# Patient Record
Sex: Male | Born: 1940 | Race: White | Hispanic: No | Marital: Married | Smoking: Current every day smoker
Health system: Southern US, Community
[De-identification: ages and names within clinical notes are randomized; demographics above are authoritative.]

## PROBLEM LIST (undated history)

## (undated) DIAGNOSIS — I441 Atrioventricular block, second degree: Secondary | ICD-10-CM

## (undated) DIAGNOSIS — H9190 Unspecified hearing loss, unspecified ear: Secondary | ICD-10-CM

## (undated) DIAGNOSIS — I495 Sick sinus syndrome: Secondary | ICD-10-CM

## (undated) DIAGNOSIS — I10 Essential (primary) hypertension: Secondary | ICD-10-CM

## (undated) DIAGNOSIS — E785 Hyperlipidemia, unspecified: Secondary | ICD-10-CM

## (undated) DIAGNOSIS — Z923 Personal history of irradiation: Secondary | ICD-10-CM

## (undated) DIAGNOSIS — Z720 Tobacco use: Secondary | ICD-10-CM

## (undated) DIAGNOSIS — F419 Anxiety disorder, unspecified: Secondary | ICD-10-CM

## (undated) DIAGNOSIS — Z950 Presence of cardiac pacemaker: Secondary | ICD-10-CM

## (undated) HISTORY — DX: Unspecified hearing loss, unspecified ear: H91.90

## (undated) HISTORY — DX: Sick sinus syndrome: I49.5

## (undated) HISTORY — DX: Presence of cardiac pacemaker: Z95.0

## (undated) HISTORY — PX: BACK SURGERY: SHX140

---

## 1994-12-06 HISTORY — PX: HERNIA REPAIR: SHX51

## 2010-12-06 DIAGNOSIS — Z950 Presence of cardiac pacemaker: Secondary | ICD-10-CM

## 2010-12-06 DIAGNOSIS — I498 Other specified cardiac arrhythmias: Secondary | ICD-10-CM

## 2010-12-06 DIAGNOSIS — I495 Sick sinus syndrome: Secondary | ICD-10-CM

## 2010-12-06 HISTORY — DX: Presence of cardiac pacemaker: Z95.0

## 2010-12-06 HISTORY — DX: Sick sinus syndrome: I49.5

## 2010-12-06 HISTORY — DX: Other specified cardiac arrhythmias: I49.8

## 2011-11-11 ENCOUNTER — Inpatient Hospital Stay (HOSPITAL_COMMUNITY)
Admission: EM | Admit: 2011-11-11 | Discharge: 2011-11-13 | DRG: 244 | Disposition: A | Discharge status: Home / Self Care | Payer: PRIVATE HEALTH INSURANCE | Attending: Cardiology | Admitting: Cardiology

## 2011-11-11 ENCOUNTER — Emergency Department (HOSPITAL_COMMUNITY): Payer: PRIVATE HEALTH INSURANCE

## 2011-11-11 DIAGNOSIS — I10 Essential (primary) hypertension: Secondary | ICD-10-CM | POA: Insufficient documentation

## 2011-11-11 DIAGNOSIS — I059 Rheumatic mitral valve disease, unspecified: Secondary | ICD-10-CM

## 2011-11-11 DIAGNOSIS — R001 Bradycardia, unspecified: Secondary | ICD-10-CM

## 2011-11-11 DIAGNOSIS — Z7982 Long term (current) use of aspirin: Secondary | ICD-10-CM

## 2011-11-11 DIAGNOSIS — E119 Type 2 diabetes mellitus without complications: Secondary | ICD-10-CM | POA: Diagnosis present

## 2011-11-11 DIAGNOSIS — I442 Atrioventricular block, complete: Secondary | ICD-10-CM | POA: Diagnosis present

## 2011-11-11 DIAGNOSIS — I452 Bifascicular block: Principal | ICD-10-CM | POA: Diagnosis present

## 2011-11-11 DIAGNOSIS — I498 Other specified cardiac arrhythmias: Secondary | ICD-10-CM

## 2011-11-11 DIAGNOSIS — Z794 Long term (current) use of insulin: Secondary | ICD-10-CM

## 2011-11-11 DIAGNOSIS — F17200 Nicotine dependence, unspecified, uncomplicated: Secondary | ICD-10-CM | POA: Diagnosis present

## 2011-11-11 DIAGNOSIS — Z720 Tobacco use: Secondary | ICD-10-CM | POA: Insufficient documentation

## 2011-11-11 DIAGNOSIS — I441 Atrioventricular block, second degree: Secondary | ICD-10-CM

## 2011-11-11 HISTORY — DX: Atrioventricular block, second degree: I44.1

## 2011-11-11 HISTORY — DX: Tobacco use: Z72.0

## 2011-11-11 HISTORY — DX: Essential (primary) hypertension: I10

## 2011-11-11 LAB — MAGNESIUM: Magnesium: 2.1 mg/dL (ref 1.5–2.5)

## 2011-11-11 LAB — DIFFERENTIAL
Basophils Absolute: 0 10*3/uL (ref 0.0–0.1)
Basophils Relative: 0 % (ref 0–1)
Lymphocytes Relative: 12 % (ref 12–46)
Neutro Abs: 3.7 10*3/uL (ref 1.7–7.7)
Neutrophils Relative %: 64 % (ref 43–77)

## 2011-11-11 LAB — COMPREHENSIVE METABOLIC PANEL
ALT: 21 U/L (ref 0–53)
AST: 20 U/L (ref 0–37)
Albumin: 3.8 g/dL (ref 3.5–5.2)
Alkaline Phosphatase: 104 U/L (ref 39–117)
CO2: 26 mEq/L (ref 19–32)
Chloride: 102 mEq/L (ref 96–112)
Potassium: 4.2 mEq/L (ref 3.5–5.1)
Total Bilirubin: 0.4 mg/dL (ref 0.3–1.2)

## 2011-11-11 LAB — CARDIAC PANEL(CRET KIN+CKTOT+MB+TROPI)
CK, MB: 2.2 ng/mL (ref 0.3–4.0)
CK, MB: 2.1 ng/mL (ref 0.3–4.0)
Relative Index: INVALID (ref 0.0–2.5)
Total CK: 64 U/L (ref 7–232)
Troponin I: <0.3 ng/mL (ref <0.30)
Troponin I: <0.3 ng/mL (ref <0.30)

## 2011-11-11 LAB — POCT I-STAT TROPONIN I: Troponin i, poc: 0 ng/mL (ref 0.00–0.08)

## 2011-11-11 LAB — URINALYSIS, ROUTINE W REFLEX MICROSCOPIC
Bilirubin Urine: NEGATIVE
Glucose, UA: NEGATIVE mg/dL
Hgb urine dipstick: NEGATIVE
Ketones, ur: NEGATIVE mg/dL
Protein, ur: NEGATIVE mg/dL

## 2011-11-11 LAB — CBC
Hemoglobin: 14.2 g/dL (ref 13.0–17.0)
MCHC: 35 g/dL (ref 30.0–36.0)
RDW: 13.7 % (ref 11.5–15.5)
WBC: 5.8 10*3/uL (ref 4.0–10.5)

## 2011-11-11 LAB — PROTIME-INR
INR: 1.08 (ref 0.00–1.49)
Prothrombin Time: 14.2 seconds (ref 11.6–15.2)

## 2011-11-11 LAB — GLUCOSE, CAPILLARY: Glucose-Capillary: 99 mg/dL (ref 70–99)

## 2011-11-11 MED ORDER — CHLORHEXIDINE GLUCONATE 4 % EX LIQD
60.0000 mL | Freq: Once | CUTANEOUS | Status: AC
  Administered 2011-11-11: 4 via TOPICAL
  Filled 2011-11-11: qty 15

## 2011-11-11 MED ORDER — SODIUM CHLORIDE 0.45 % IV SOLN: INTRAVENOUS | Status: DC

## 2011-11-11 MED ORDER — SODIUM CHLORIDE 0.9 % IR SOLN
80.0000 mg | Status: DC
  Filled 2011-11-11: qty 2

## 2011-11-11 MED ORDER — SODIUM CHLORIDE 0.9 % IV SOLN
INTRAVENOUS | Status: DC
  Administered 2011-11-11: 11:00:00 via INTRAVENOUS

## 2011-11-11 MED ORDER — NITROGLYCERIN 0.4 MG SL SUBL: 0.4000 mg | SUBLINGUAL_TABLET | SUBLINGUAL | Status: DC | PRN

## 2011-11-11 MED ORDER — ATROPINE SULFATE 1 MG/ML IJ SOLN
0.5000 mg | Freq: Once | INTRAMUSCULAR | Status: AC
  Administered 2011-11-11: 0.5 mg via INTRAVENOUS
  Filled 2011-11-11: qty 1

## 2011-11-11 MED ORDER — SODIUM CHLORIDE 0.9 % IJ SOLN
3.0000 mL | Freq: Two times a day (BID) | INTRAMUSCULAR | Status: DC
  Administered 2011-11-11 – 2011-11-13 (×4): 3 mL via INTRAVENOUS

## 2011-11-11 MED ORDER — ASPIRIN EC 81 MG PO TBEC
81.0000 mg | DELAYED_RELEASE_TABLET | Freq: Every day | ORAL | Status: DC
  Administered 2011-11-12 – 2011-11-13 (×2): 81 mg via ORAL
  Filled 2011-11-11 (×2): qty 1

## 2011-11-11 MED ORDER — ASPIRIN 81 MG PO CHEW: 324.0000 mg | CHEWABLE_TABLET | ORAL | Status: DC

## 2011-11-11 MED ORDER — ACETAMINOPHEN 325 MG PO TABS
650.0000 mg | ORAL_TABLET | ORAL | Status: DC | PRN
  Administered 2011-11-12: 650 mg via ORAL
  Filled 2011-11-11: qty 2

## 2011-11-11 MED ORDER — CEFAZOLIN SODIUM 1-5 GM-% IV SOLN
1.0000 g | INTRAVENOUS | Status: DC
  Filled 2011-11-11: qty 50

## 2011-11-11 MED ORDER — SODIUM CHLORIDE 0.9 % IJ SOLN: 3.0000 mL | INTRAMUSCULAR | Status: DC | PRN

## 2011-11-11 MED ORDER — SODIUM CHLORIDE 0.9 % IV SOLN: 250.0000 mL | INTRAVENOUS | Status: DC | PRN

## 2011-11-11 MED ORDER — ONDANSETRON HCL 4 MG/2ML IJ SOLN
4.0000 mg | Freq: Once | INTRAMUSCULAR | Status: AC
  Administered 2011-11-11: 4 mg via INTRAVENOUS

## 2011-11-11 MED ORDER — ONDANSETRON HCL 4 MG/2ML IJ SOLN
4.0000 mg | Freq: Once | INTRAMUSCULAR | Status: DC
  Filled 2011-11-11: qty 2

## 2011-11-11 MED ORDER — ASPIRIN 300 MG RE SUPP
300.0000 mg | RECTAL | Status: DC
  Filled 2011-11-11: qty 1

## 2011-11-11 MED ORDER — THERA M PLUS PO TABS: 1.0000 | ORAL_TABLET | Freq: Every day | ORAL | Status: DC

## 2011-11-11 MED ORDER — SODIUM CHLORIDE 0.9 % IV SOLN
INTRAVENOUS | Status: DC
  Administered 2011-11-12: 50 mL/h via INTRAVENOUS

## 2011-11-11 MED ORDER — INSULIN ASPART 100 UNIT/ML ~~LOC~~ SOLN
0.0000 [IU] | Freq: Three times a day (TID) | SUBCUTANEOUS | Status: DC
  Filled 2011-11-11: qty 3

## 2011-11-11 MED ORDER — ASPIRIN EC 81 MG PO TBEC: 81.0000 mg | DELAYED_RELEASE_TABLET | Freq: Every day | ORAL | Status: DC

## 2011-11-11 MED ORDER — MORPHINE SULFATE 4 MG/ML IJ SOLN
4.0000 mg | Freq: Once | INTRAMUSCULAR | Status: DC
  Filled 2011-11-11: qty 1

## 2011-11-11 MED ORDER — ONDANSETRON HCL 4 MG/2ML IJ SOLN: 4.0000 mg | Freq: Four times a day (QID) | INTRAMUSCULAR | Status: DC | PRN

## 2011-11-11 MED ORDER — CHLORHEXIDINE GLUCONATE 4 % EX LIQD
60.0000 mL | Freq: Once | CUTANEOUS | Status: AC
  Administered 2011-11-12: 4 via TOPICAL
  Filled 2011-11-11: qty 15

## 2011-11-11 MED ORDER — MORPHINE SULFATE 4 MG/ML IJ SOLN
4.0000 mg | Freq: Once | INTRAMUSCULAR | Status: AC
  Administered 2011-11-11: 4 mg via INTRAVENOUS

## 2011-11-11 MED ORDER — ATROPINE SULFATE 1 MG/ML IJ SOLN: 0.5000 mg | Freq: Once | INTRAMUSCULAR | Status: DC

## 2011-11-11 MED ORDER — THERA M PLUS PO TABS
1.0000 | ORAL_TABLET | Freq: Every day | ORAL | Status: DC
  Administered 2011-11-12 – 2011-11-13 (×2): 1 via ORAL
  Filled 2011-11-11 (×3): qty 1

## 2011-11-11 NOTE — ED Notes (Signed)
Pt seen at pcp for regular check up.  Pt had ekg changes (sinus brady).  Sent to ed for evaluation

## 2011-11-11 NOTE — Progress Notes (Signed)
  Echocardiogram 2D Echocardiogram has been performed.  Antonio Hancock 11/11/2011, 5:15 PM

## 2011-11-11 NOTE — ED Notes (Signed)
Pt sitting up in bed talking with family at bedside. Pt smiling/laughing. Denies cp. Denies sob. Pt has no complaints at this time.

## 2011-11-11 NOTE — ED Provider Notes (Signed)
History     CSN: [id number] Arrival date & time: 11/11/2011 10:18 AM   First MD Initiated Contact with Patient 11/11/11 1051      Chief Complaint  Patient presents with  . Bradycardia    (Consider location/radiation/quality/duration/timing/severity/associated sxs/prior treatment) HPI Comments: Patient is a 71 year old man who went in for a routine exam with his primary care physician. Have a heart rate that was very slow. An EKG showed a bradycardia of about 37. He was therefore referred to [facility] [place] for evaluation. He did not feel sick in any way prior to this discovery. He was being checked for diabetes and hypertension.  Patient is a 70 y.o. male presenting with palpitations.  Palpitations  This is a new problem. The current episode started less than 1 hour ago. The problem occurs constantly. The problem has not changed since onset.Episode Length: It is not known how long he has had a bradycardia. He has tried nothing for the symptoms. Risk factors include smoking/tobacco exposure, diabetes mellitus and being male.    Past Medical History  Diagnosis Date  . Hypertension   . Diabetes mellitus     Diagnosed Fall 2012  . Mobitz type 2 second degree AV block     11/11/2011  . Tobacco abuse     1/2 ppd x 55 yrs    Past Surgical History  Procedure Date  . Back surgery     History reviewed. No pertinent family history.  History  Substance Use Topics  . Smoking status: Current Everyday Smoker -- 0.5 packs/day    Types: Cigarettes  . Smokeless tobacco: Not on file  . Alcohol Use: No      Review of Systems  Constitutional: Negative.   HENT: Negative.   Eyes: Negative.   Respiratory: Negative.   Cardiovascular: Positive for palpitations.       Bradycardia.  Genitourinary: Negative.   Musculoskeletal: Negative.   Skin: Negative.     Allergies  Review of patient's allergies indicates no known allergies.  Home Medications   Current Outpatient Rx  Name Route  Sig Dispense Refill  . ASPIRIN EC 81 MG PO TBEC Oral Take 81 mg by mouth daily.      Marland Kitchen METFORMIN HCL ER 500 MG PO TB24 Oral Take 500 mg by mouth daily with supper.      Carma Leaven M PLUS PO TABS Oral Take 1 tablet by mouth daily.      Marland Kitchen OLMESARTAN MEDOXOMIL 20 MG PO TABS Oral Take 20 mg by mouth daily.        BP 169/56  Pulse 37  Temp(Src) 98.6 F (37 C) (Oral)  Resp 16  SpO2 99%  Physical Exam  ED Course  CRITICAL CARE Performed by: Osvaldo Human Authorized by: Osvaldo Human Total critical care time: 60 minutes Critical care was necessary to treat or prevent imminent or life-threatening deterioration of the following conditions: Symptomatic bradycardia. Critical care was time spent personally by me on the following activities: development of treatment plan with patient or surrogate, discussions with consultants, evaluation of patient's response to treatment, examination of patient, obtaining history from patient or surrogate, ordering and performing treatments and interventions, ordering and review of radiographic studies, ordering and review of laboratory studies, re-evaluation of patient's condition and transcutaneous pacing.   (including critical care time)  12:48 PM  Date: 11/11/2011  Rate: 38  Rhythm: sinus bradycardia  QRS Axis: left QRS:  Left ventricular hypertrophy  Intervals: normal  ST/T Wave abnormalities:  normal  Conduction Disutrbances:right bundle branch block and left anterior fascicular block  Narrative Interpretation: Abnormal EKG.    Old EKG Reviewed: none available   12:48 PM Pt seen --> physical exam performed.  IV Atropine ordered --> minimal response.  Paddles for transcutaneous pacemaker applied.  [facility] had been called by [facility]; they are here to see pt.     12:48 PM Patient now feels lightheaded, and has a funny taste in his mouth. I advised him that we would give him additional atropine, as well as some  morphine and Zofran, and we'll start his pacemaker at a rate of 70 and 70 mV.  12:48 PM  Date: 11/11/2011  Rate: 37  Rhythm: sinus bradycardia with 2:1 AV Block  QRS Axis: left  Intervals: normal  ST/T Wave abnormalities: normal  Conduction Disutrbances:  2:1 AV block  Narrative Interpretation: Abnormal EKG  Old EKG Reviewed: unchanged  12:48 PM Admitted by [facility] to CCU, 2900.  1. Bradycardia            Carleene Cooper III, MD 11/11/11 1248

## 2011-11-11 NOTE — ED Notes (Signed)
Cardiology at bedside.

## 2011-11-11 NOTE — H&P (Signed)
Patient ID: Antonio Hancock MRN: [id number], DOB/AGE: [birth year]   Admit date: 11/11/2011   Primary Physician: Angelene Giovanni, PA-C; Benedetto Goad, MD.  Primary Cardiologist: New -  D. McLean  Pt. Profile: 70 year old male referred from PCP to ER for newly discovered/asymptomatic bradycardia.  Problem List: Past Medical History  Diagnosis Date  . Hypertension   . Diabetes mellitus     Diagnosed Fall 2012  . Mobitz type 2 second degree AV block     11/11/2011  . Tobacco abuse     1/2 ppd x 55 yrs    Past Surgical History  Procedure Date  . Back surgery      Allergies: No Known Allergies  HPI:   70 year old male w/o prior cardiac history.  Over the past 3-4 months, pt has noted intermittent dizziness, usually while moving from a lying or seated position to a standing position.  He is pretty active, walking 1 mile daily without limitations.  He checks his BP regularly and has noted in the past week that his HR dipped into 30's at times.  Asymptomatic. Pt went to [facility] today for his scheduled 90-day follow-up appointment for diabetes and HTN management.  He was noted to be bradycardic in the 30's, an ECG was obtained that showed 2:1 AV-block and EMS was called for transport to the ED.  He remains asymptomatic and denies prior h/o c/p, sob, weakness, fatigue, diaphoresis, leg pain, or edema.  In the ED he was given 0.5 mg of IV atropine with minimal ventricular response.  Pt currently has Zoll pads on and is asymptomatic.  His SBP is running in the 180's.   ER Medications Medications Prior to Admission  Medication Dose Route Frequency Provider Last Rate Last Dose  . 0.9 %  sodium chloride infusion   Intravenous Continuous Carleene Cooper III, MD 80 mL/hr at 11/11/11 1120    . atropine injection 0.5 mg  0.5 mg Intravenous Once Carleene Cooper III, MD   0.5 mg at 11/11/11 1124  . atropine injection 0.5 mg  0.5 mg Intravenous Once Carleene Cooper III, MD   0.5 mg at  11/11/11 1234  . atropine injection 0.5 mg  0.5 mg Intravenous Once Carleene Cooper III, MD      . morphine 4 MG/ML injection 4 mg  4 mg Intravenous Once Carleene Cooper III, MD      . morphine 4 MG/ML injection 4 mg  4 mg Intravenous Once Carleene Cooper III, MD   4 mg at 11/11/11 1242  . ondansetron (ZOFRAN) injection 4 mg  4 mg Intravenous Once Carleene Cooper III, MD      . ondansetron [facility]) injection 4 mg  4 mg Intravenous Once Carleene Cooper III, MD   4 mg at 11/11/11 1242        Family History:  Mother- Brain tumor, deceased 54 Father-Prostate CA, DVT, HTN, DM, deceased 22 Brother-DM, HTN Sisters-2 dead   History   Social History  . Marital Status: yes    Spouse Name: N/A    Number of Children: none  . Years of Education: N/A   Occupational History  . Self employed.    Social History Main Topics  . Smoking status: Current Everyday Smoker -- 0.5-0.75 packs/dayx55 years    Types: Cigarettes  . Smokeless tobacco: Not on file  . Alcohol Use: No  . Drug Use: No  . Sexually Active:    Other Topics Concern  . Not on file  Social History Narrative  . No narrative on file     Review of Systems: General: negative for chills, fever, night sweats or weight changes.  Cardiovascular: negative for chest pain, dyspnea on exertion, edema, orthopnea, palpitations, paroxysmal nocturnal dyspnea.  He has noted occas dizziness - orthostasis Dermatological: negative for rash. Respiratory: negative for cough or wheezing. Urologic: negative for hematuria, urgency, dysuria. Abdominal: positive for nausea (s/p morphine), negative for vomiting, diarrhea, bright red blood per rectum, melena, or hematemesis Neurologic: Positive for dizziness, presyncope,. negative for visual changes, syncope. All other systems reviewed and are otherwise negative except as noted above.  Physical Exam: Blood pressure 169/56, pulse 37, temperature 98.6 F (37 C), temperature source Oral, resp. rate 16, SpO2  99.00%.  General: Well developed, well nourished, in no acute distress. Head: Normocephalic, atraumatic, sclera non-icteric, no xanthomas, nares are without discharge.  Poor dentition.   Neck: Supple without bruits or JVD. Lungs:  Resp regular and unlabored, markedly diminished breath sounds.  rhonchi x4 posterior fields. Heart: regular, bradycardic - distant heart sounds.. no s3, s4, or murmurs. Abdomen: Soft, non-tender, non-distended, BS + x 4.  Msk:  Strength and tone appears normal for age. Extremities: No clubbing, cyanosis or edema. DP/PT/Radials 2+ and equal bilaterally. Neuro: Alert and oriented X 3. Moves all extremities spontaneously. Psych: Pleasant, normal affect.   Labs:   Results for orders placed during the hospital encounter of 11/11/11 (from the past 72 hour(s))  CBC     Status: Normal   Collection Time   11/11/11 11:05 AM      Component Value Range Comment   WBC 5.8  4.0 - 10.5 (K/uL)    RBC 4.54  4.22 - 5.81 (MIL/uL)    Hemoglobin 14.2  13.0 - 17.0 (g/dL)    HCT 40.9  81.1 - 91.4 (%)    MCV 89.4  78.0 - 100.0 (fL)    MCH 31.3  26.0 - 34.0 (pg)    MCHC 35.0  30.0 - 36.0 (g/dL)    RDW 78.2  95.6 - 21.3 (%)    Platelets 211  150 - 400 (K/uL)   DIFFERENTIAL     Status: Abnormal   Collection Time   11/11/11 11:05 AM      Component Value Range Comment   Neutrophils Relative 64  43 - 77 (%)    Neutro Abs 3.7  1.7 - 7.7 (K/uL)    Lymphocytes Relative 12  12 - 46 (%)    Lymphs Abs 0.7  0.7 - 4.0 (K/uL)    Monocytes Relative 14 (*) 3 - 12 (%)    Monocytes Absolute 0.8  0.1 - 1.0 (K/uL)    Eosinophils Relative 10 (*) 0 - 5 (%)    Eosinophils Absolute 0.6  0.0 - 0.7 (K/uL)    Basophils Relative 0  0 - 1 (%)    Basophils Absolute 0.0  0.0 - 0.1 (K/uL)   COMPREHENSIVE METABOLIC PANEL     Status: Abnormal   Collection Time   11/11/11 11:05 AM      Component Value Range Comment   Sodium 137  135 - 145 (mEq/L)    Potassium 4.2  3.5 - 5.1 (mEq/L)    Chloride 102  96 -  112 (mEq/L)    CO2 26  19 - 32 (mEq/L)    Glucose, Bld 98  70 - 99 (mg/dL)    BUN 20  6 - 23 (mg/dL)    Creatinine, Ser 0.86  0.50 - 1.35 (  mg/dL)    Calcium 9.3  8.4 - 10.5 (mg/dL)    Total Protein 7.9  6.0 - 8.3 (g/dL)    Albumin 3.8  3.5 - 5.2 (g/dL)    AST 20  0 - 37 (U/L)    ALT 21  0 - 53 (U/L)    Alkaline Phosphatase 104  39 - 117 (U/L)    Total Bilirubin 0.4  0.3 - 1.2 (mg/dL)    GFR calc non Af Amer 55 (*) >90 (mL/min)    GFR calc Af Amer 64 (*) >90 (mL/min)   CARDIAC PANEL(CRET KIN+CKTOT+MB+TROPI)     Status: Normal   Collection Time   11/11/11 11:06 AM      Component Value Range Comment   Total CK 64  7 - 232 (U/L)    CK, MB 2.3  0.3 - 4.0 (ng/mL)    Troponin I <0.30  <0.30 (ng/mL)    Relative Index RELATIVE INDEX IS INVALID  0.0 - 2.5    POCT I-STAT TROPONIN I     Status: Normal   Collection Time   11/11/11 11:21 AM      Component Value Range Comment   Troponin i, poc 0.00  0.00 - 0.08 (ng/mL)    Comment 3               Radiology/Studies: Dg Chest Port 1 View  11/11/2011  *RADIOLOGY REPORT*  Clinical Data: Huston Foley cardia  PORTABLE CHEST - 1 VIEW  Comparison: None.  Findings: The lungs are clear.  There is moderate cardiomegaly present.  Harrington rods are noted for fusion of the lower thoracic spine.  IMPRESSION: Cardiomegaly.  No active lung disease.  Original Report Authenticated By: Juline Patch, M.D.    EKG: From family practice--sinus-brady w/ 2:1 AV-block.  LAD+RBBB, left ant. fasicular block. PR 184 ms; QRS 164 ms; QTc 463 ms.  ASSESSMENT AND PLAN:  1) Mobitz II heart block:  1 week h/o of noting HR's in the 30's - asymptomatic.  Found today to have Mobtiz II AVB.  Plan to admit to CCU with zoll @ bedside and pads on.  Lytes ok.  Check echo to eval wall motion and help guide as to whether or not pt will require further ischemic eval prior to PPM placement, which we will tentatively plan for tomorrow.  EP to see in am or sooner if necessary.   2) HTN- Currently  hypertensive, will watch BP while pt is bradycardic.  Hold benicar. 3) DM- present on admission. BG 98 mg/dL on chemistry.  Add ssi.  Hold metformin. 4) Tob abuse:  Cessation advised.  Signed, Nicolasa Ducking, NP 11/11/2011, 1:04 PM  Patient seen with NP, agree with note.  70 yo with history of HTN and DM was found to be bradycardic at PCP's office today (routine diabetes followup).  ECGs have shown HR 30s-40s with 2:1 AV block, RBBB + LAFB.  He is not on nodal blockers.  He has been essentially asymptomatic: occasional vague dizziness but no syncope, no chest pain, no exertional dyspnea.  One set of cardiac enzymes has been negative.  SBP has been elevated 160s-180s.  No response to atropine.   - Admit to step down unit. - Pads in place for temporary pacing if needed. - Echo today for LV function.  Will cycle cardiac enzymes.  If echo and enzymes unremarkable, can likely place PPM tomorrow with ischemic workup (myoview) afterwards or as outpatient given no symptoms.  If either are abnormal, may need  cath prior to PPM.  - ASA 81 mg daily.   Marca Ancona 11/11/2011 1:13 PM

## 2011-11-11 NOTE — ED Notes (Signed)
Pacer pads applied to pt

## 2011-11-12 ENCOUNTER — Encounter (HOSPITAL_COMMUNITY)
Admission: EM | Disposition: A | Discharge status: Home / Self Care | Payer: Self-pay | Source: Home / Self Care | Attending: Cardiology

## 2011-11-12 DIAGNOSIS — I498 Other specified cardiac arrhythmias: Secondary | ICD-10-CM

## 2011-11-12 DIAGNOSIS — I441 Atrioventricular block, second degree: Secondary | ICD-10-CM

## 2011-11-12 HISTORY — PX: PERMANENT PACEMAKER INSERTION: SHX5480

## 2011-11-12 LAB — LIPID PANEL
HDL: 25 mg/dL — ABNORMAL LOW (ref >39)
Total CHOL/HDL Ratio: 7.4 RATIO
Triglycerides: 157 mg/dL — ABNORMAL HIGH (ref <150)

## 2011-11-12 LAB — BASIC METABOLIC PANEL
BUN: 24 mg/dL — ABNORMAL HIGH (ref 6–23)
CO2: 23 mEq/L (ref 19–32)
Calcium: 9.5 mg/dL (ref 8.4–10.5)
Chloride: 105 mEq/L (ref 96–112)
Creatinine, Ser: 1.6 mg/dL — ABNORMAL HIGH (ref 0.50–1.35)

## 2011-11-12 LAB — GLUCOSE, CAPILLARY
Glucose-Capillary: 103 mg/dL — ABNORMAL HIGH (ref 70–99)
Glucose-Capillary: 107 mg/dL — ABNORMAL HIGH (ref 70–99)
Glucose-Capillary: 123 mg/dL — ABNORMAL HIGH (ref 70–99)
Glucose-Capillary: 82 mg/dL (ref 70–99)

## 2011-11-12 LAB — CARDIAC PANEL(CRET KIN+CKTOT+MB+TROPI)
CK, MB: 2 ng/mL (ref 0.3–4.0)
Total CK: 107 U/L (ref 7–232)
Troponin I: <0.3 ng/mL (ref <0.30)

## 2011-11-12 SURGERY — PERMANENT PACEMAKER INSERTION
Anesthesia: LOCAL

## 2011-11-12 MED ORDER — SODIUM CHLORIDE 0.9 % IV SOLN
INTRAVENOUS | Status: AC
  Administered 2011-11-12: 15:00:00 via INTRAVENOUS

## 2011-11-12 MED ORDER — METOPROLOL SUCCINATE ER 50 MG PO TB24
50.0000 mg | ORAL_TABLET | Freq: Every day | ORAL | Status: DC
  Administered 2011-11-12 – 2011-11-13 (×2): 50 mg via ORAL
  Filled 2011-11-12 (×2): qty 1

## 2011-11-12 MED ORDER — AMLODIPINE BESYLATE 5 MG PO TABS
5.0000 mg | ORAL_TABLET | Freq: Every day | ORAL | Status: DC
  Administered 2011-11-12 – 2011-11-13 (×2): 5 mg via ORAL
  Filled 2011-11-12 (×2): qty 1

## 2011-11-12 MED ORDER — CEFAZOLIN SODIUM 1-5 GM-% IV SOLN
1.0000 g | Freq: Four times a day (QID) | INTRAVENOUS | Status: AC
  Administered 2011-11-12 – 2011-11-13 (×3): 1 g via INTRAVENOUS
  Filled 2011-11-12 (×3): qty 50

## 2011-11-12 MED ORDER — MIDAZOLAM HCL 2 MG/2ML IJ SOLN
INTRAMUSCULAR | Status: AC
  Filled 2011-11-12: qty 2

## 2011-11-12 MED ORDER — SODIUM CHLORIDE 0.9 % IV SOLN: 250.0000 mL | INTRAVENOUS | Status: DC | PRN

## 2011-11-12 MED ORDER — FENTANYL CITRATE 0.05 MG/ML IJ SOLN
INTRAMUSCULAR | Status: AC
  Filled 2011-11-12: qty 2

## 2011-11-12 MED ORDER — METFORMIN HCL 500 MG PO TABS: 500.0000 mg | ORAL_TABLET | Freq: Every day | ORAL | Status: DC

## 2011-11-12 MED ORDER — HEPARIN (PORCINE) IN NACL 2-0.9 UNIT/ML-% IJ SOLN
INTRAMUSCULAR | Status: AC
  Filled 2011-11-12: qty 1000

## 2011-11-12 MED ORDER — LIDOCAINE HCL (PF) 1 % IJ SOLN
INTRAMUSCULAR | Status: AC
  Filled 2011-11-12: qty 60

## 2011-11-12 MED ORDER — ALPRAZOLAM 0.5 MG PO TABS
0.5000 mg | ORAL_TABLET | Freq: Two times a day (BID) | ORAL | Status: DC | PRN
  Administered 2011-11-12: 0.5 mg via ORAL
  Filled 2011-11-12: qty 1

## 2011-11-12 NOTE — Consult Note (Signed)
Electrophysiology Consult Note    Patient ID: Antonio Hancock MRN: [id number], DOB/AGE: [birth year] 70 y.o.  Admit date: 11/11/2011 Date of Consult: 11/12/2011  Primary Physician: [facility] Primary Cardiologist: new  Chief Complaint: bradycardia Reason for Consultation: 2:1 AVB with RBBB/LAFB  HPI: Pt admitted for relatively asym bradycardia  Over the past 3-4 months, he has noted intermittent dizziness, usually while moving from a lying or seated position to a standing position. He is pretty active, walking 1 mile daily without limitations. He checks his BP regularly and has noted in the past week that his HR dipped into 30's at times. He went to [facility] today for his scheduled 90-day follow-up appointment for diabetes and HTN management. He was noted to be bradycardic in the 30's, an ECG was obtained that showed 2:1 AV-block and EMS was called for transport to the ED.   He prior h/o c/p, sob, weakness, fatigue, diaphoresis, leg pain, or edema. In the ED he was given 0.5 mg of IV atropine with minimal ventricular response. Pt currently has Zoll pads on and is asymptomatic. His SBP is running in the 180's  Denies cough, weight loss night sweats and prior cardiac history  Echo Normal LV function   Past Medical History  Diagnosis Date  . Hypertension   . Diabetes mellitus     Diagnosed Fall 2012  . Mobitz type 2 second degree AV block     11/11/2011  . Tobacco abuse     1/2 ppd x 55 yrs      Surgical History:  Past Surgical History  Procedure Date  . Back surgery      Prescriptions prior to admission  Medication Sig Dispense Refill  . aspirin EC 81 MG tablet Take 81 mg by mouth daily.        . metFORMIN (GLUCOPHAGE-XR) 500 MG 24 hr tablet Take 500 mg by mouth daily with supper.        . Multiple Vitamins-Minerals (MULTIVITAMINS THER. W/MINERALS) TABS Take 1 tablet by mouth daily.        Marland Kitchen olmesartan (BENICAR) 20 MG tablet Take 20 mg by  mouth daily.          Inpatient Medications:    . aspirin  324 mg Oral NOW  . aspirin EC  81 mg Oral Daily  . atropine  0.5 mg Intravenous Once  . atropine  0.5 mg Intravenous Once  . ceFAZolin (ANCEF) IV  1 g Intravenous On Call  . chlorhexidine  60 mL Topical Once  . chlorhexidine  60 mL Topical Once  . gentamicin irrigation  80 mg Irrigation On Call  . insulin aspart  0-15 Units Subcutaneous TID WC  .  morphine injection  4 mg Intravenous Once  . multivitamins ther. w/minerals  1 tablet Oral Daily  . ondansetron (ZOFRAN) IV  4 mg Intravenous Once  . sodium chloride  3 mL Intravenous Q12H  . DISCONTD: aspirin EC  81 mg Oral Daily  . DISCONTD: aspirin  300 mg Rectal NOW  . DISCONTD: atropine  0.5 mg Intravenous Once  . DISCONTD:  morphine injection  4 mg Intravenous Once  . DISCONTD: multivitamins ther. w/minerals  1 tablet Oral Daily  . DISCONTD: ondansetron (ZOFRAN) IV  4 mg Intravenous Once    Allergies: No Known Allergies  History   Social History  . Marital Status: Married    Spouse Name: N/A    Number of Children: N/A  . Years of Education: N/A  Occupational History  . Not on file.   Social History Main Topics  . Smoking status: Current Everyday Smoker -- 0.5 packs/day for 55 years    Types: Cigarettes  . Smokeless tobacco: Not on file  . Alcohol Use: No  . Drug Use: No  . Sexually Active: Not on file   Other Topics Concern  . Not on file   Social History Narrative   Gambles on the Internet     Family History  Problem Relation Age of Onset  . Brain cancer Mother     deceased 63  . Prostate cancer Father     deceased 94  . Deep vein thrombosis Father   . Hypertension Father   . Diabetes Father      Patient reports no health concerns. ROS neg apaert from the above    Physical Exam:  Blood pressure 105/45, pulse 37, temperature 98.3 F (36.8 C), temperature source Oral, resp. rate 15, height 6' (1.829 m), weight 193 lb 2 oz (87.6 kg), SpO2  96.00%.  General appearance: alert, cooperative, appears stated age and no distress HEENT:PERRLA, extra ocular movement intact, oropharynx clear, no lesions and airway 2, poor dentition Neck: no adenopathy, no carotid bruit, no JVD, supple, symmetrical, trachea midline and thyroid not enlarged, symmetric, no tenderness/mass/nodules Back: symmetric, no curvature. ROM normal. No CVA tenderness. Resp: clear to auscultation bilaterally Chest wall: no tenderness Cardio:regular rate & rhythm, bradycardia and PMI non-displaced GI: soft, non-tender; bowel sounds normal; no masses,  no organomegaly Extremities: extremities normal, atraumatic, no cyanosis or edema Pulses: 2+ and symmetric Skin: Skin color, texture, turgor normal. No rashes or lesions Lymph nodes: Cervical, supraclavicular, and axillary nodes normal. Neuro:Alert and oriented x3. Gait normal. Reflexes and motor strength normal and symmetric. Cranial nerves 2-12 and sensation grossly intact. Psychnormal affect   EKG: 2:1 AVBlock with RBBB/LAFB  Basic Metabolic Panel:  Lab 11/12/11 1610 11/11/11 1550 11/11/11 1105  NA 138 -- 137  K 4.4 -- 4.2  CL 105 -- 102  CO2 23 -- 26  GLUCOSE 102* -- 98  BUN 24* -- 20  CREATININE 1.60* -- 1.28  CALCIUM 9.5 -- 9.3  MG -- 2.1 --  PHOS -- -- --   Cardiac Enzymes:  Basename 11/12/11 0010 11/11/11 1745 11/11/11 1542  CKTOTAL 107 62 64  CKMB 2.0 2.1 2.2  CKMBINDEX -- -- --  TROPONINI <0.30 <0.30 <0.30   CBC:  Lab 11/11/11 1105  WBC 5.8  NEUTROABS 3.7  HGB 14.2  HCT 40.6  MCV 89.4  PLT 211   Liver Function Tests:  Basename 11/11/11 1105  AST 20  ALT 21  ALKPHOS 104  BILITOT 0.4  PROT 7.9  ALBUMIN 3.8   Basename 11/12/11 0541  CHOL 186  HDL 25*  LDLCALC 130*  TRIG 157*  CHOLHDL 7.4  LDLDIRECT --   Thyroid Function Tests:  Basename 11/11/11 1550  TSH 1.465  T4TOTAL --  T3FREE --  THYROIDAB --   Anemia Panel: No results found for this basename:  VITAMINB12,FOLATE,FERRITIN,TIBC,IRON,RETICCTPCT in the last 72 hours     Radiology/Studies: Dg Chest Port 1 View  11/11/2011  *RADIOLOGY REPORT*  Clinical Data: Huston Foley cardia  PORTABLE CHEST - 1 VIEW  Comparison: None.  Findings: The lungs are clear.  There is moderate cardiomegaly present.  Harrington rods are noted for fusion of the lower thoracic spine.  IMPRESSION: Cardiomegaly.  No active lung disease.  Original Report Authenticated By: Juline Patch, M.D.  Patient Active Hospital Problem List: AV block, 2nd degree (11/11/2011)   Assessment: pt with largely asx 2:1 block but also with bifasicualr block.  I agree that pacing is indicated with the aHR in 30's The other issue is the underlying cause  Will send ACE level, he will need CT chest for adenopathy to look for sarcoid and would do outpt stress given multiple risk factors.  I cant find ACE level in EPIC  Will work on as [employer]: PM have reviewed benefits and risks incl but not limited to death, perforation of heart or lung, infection and lead dislodgement Hypertension ()   Assessment: poorly controlled   Plan: will augment therapy once pacer in place Diabetes mellitus (11/11/2011)   Assessment: needs Hgb A1c   Plan:  Bradycardia (11/11/2011)   Assessment: as above   Plan:     Signed, Sherryl Manges MD

## 2011-11-12 NOTE — Progress Notes (Signed)
S/p pacer With DM needs ace or arb for BB but held today because of CR  Begin over weekend if cr imprves  Was on benicar Anticipate d/c to home sun from PM point of view If not dependent sat am, can go home sat

## 2011-11-12 NOTE — Progress Notes (Signed)
PHARMACIST - PHYSICIAN COMMUNICATION DR:  Graciela Husbands  CONCERNING:  METFORMIN SAFE ADMINISTRATION POLICY  RECOMMENDATION: Metformin has been placed on DISCONTINUE (rejected order) STATUS and should be reordered only after any of the conditions below are ruled out.  Current safety recommendations include avoiding metformin for a minimum of 48 hours after the patient's exposure to intravenous contrast media.  DESCRIPTION:  The Pharmacy Committee has adopted a policy that restricts the use of metformin in hospitalized patients until all the contraindications to administration have been ruled out. Specific contraindications are: [x]  Serum creatinine ? 1.5 for males []  Serum creatinine ? 1.4 for females []  Shock, acute MI, sepsis, hypoxemia, dehydration []  Planned administration of intravenous iodinated contrast media []  Heart Failure patients with low EF []  Acute or chronic metabolic acidosis (including DKA)

## 2011-11-12 NOTE — Progress Notes (Signed)
Patient Name: Antonio Hancock Date of Encounter: 11/12/2011     Principal Problem:  *AV block, 2nd degree Active Problems:  Hypertension  Diabetes mellitus  Bradycardia    SUBJECTIVE: No events overnight, pt complained of insomnia and frequent awakenings from ICU noise. No CP, SOB, diaphoresis, dizziness.  All other ROS reviewed and unchanged from admission.   CURRENT MEDS    . aspirin  324 mg Oral NOW  . aspirin EC  81 mg Oral Daily  . atropine  0.5 mg Intravenous Once  . atropine  0.5 mg Intravenous Once  . ceFAZolin (ANCEF) IV  1 g Intravenous On Call  . chlorhexidine  60 mL Topical Once  . chlorhexidine  60 mL Topical Once  . gentamicin irrigation  80 mg Irrigation On Call  . insulin aspart  0-15 Units Subcutaneous TID WC  .  morphine injection  4 mg Intravenous Once  . multivitamins ther. w/minerals  1 tablet Oral Daily  . ondansetron (ZOFRAN) IV  4 mg Intravenous Once  . sodium chloride  3 mL Intravenous Q12H  . DISCONTD: aspirin EC  81 mg Oral Daily  . DISCONTD: aspirin  300 mg Rectal NOW  . DISCONTD: atropine  0.5 mg Intravenous Once  . DISCONTD:  morphine injection  4 mg Intravenous Once  . DISCONTD: multivitamins ther. w/minerals  1 tablet Oral Daily  . DISCONTD: ondansetron (ZOFRAN) IV  4 mg Intravenous Once    OBJECTIVE  Filed Vitals:   11/12/11 0400 11/12/11 0444 11/12/11 0500 11/12/11 0600  BP: 112/30  121/43 119/42  Pulse:      Temp:      TempSrc:      Resp: 19  21 19   Height:      Weight:  193 lb 2 oz (87.6 kg)    SpO2: 96%  93% 95%    Intake/Output Summary (Last 24 hours) at 11/12/11 1610 Last data filed at 11/12/11 0600  Gross per 24 hour  Intake    220 ml  Output    400 ml  Net   -180 ml   Weight change:   PHYSICAL EXAM  General: Well developed, well nourished, in no acute distress.  Head: Normocephalic, atraumatic, sclera non-icteric, no xanthomas, nares are without discharge. Poor dentition.  Neck: Supple without bruits or JVD.   Lungs: Resp regular and unlabored, markedly diminished breath sounds. rhonchi x4 posterior fields.  Heart: regular, bradycardic - distant heart sounds.. no s3, s4, or murmurs.  Abdomen: Soft, non-tender, non-distended, BS + x 4.  Msk: Strength and tone appears normal for age.  Extremities: No clubbing, cyanosis or edema. DP/PT/Radials 2+ and equal bilaterally.  Neuro: Alert and oriented X 3. Moves all extremities spontaneously.  Psych: Pleasant, normal affect.  LABS:  CBC:  Basename 11/11/11 1105  WBC 5.8  NEUTROABS 3.7  HGB 14.2  HCT 40.6  MCV 89.4  PLT 211   Basic Metabolic Panel:  Basename 11/12/11 0541 11/11/11 1550 11/11/11 1105  NA 138 -- 137  K 4.4 -- 4.2  CL 105 -- 102  CO2 23 -- 26  GLUCOSE 102* -- 98  BUN 24* -- 20  CREATININE 1.60* -- 1.28  CALCIUM 9.5 -- 9.3  MG -- 2.1 --  PHOS -- -- --   Liver Function Tests:  Basename 11/11/11 1105  AST 20  ALT 21  ALKPHOS 104  BILITOT 0.4  PROT 7.9  ALBUMIN 3.8   Cardiac Enzymes:  Basename 11/12/11 0010 11/11/11 1745 11/11/11 1542  CKTOTAL 107 62 64  CKMB 2.0 2.1 2.2  CKMBINDEX -- -- --  TROPONINI <0.30 <0.30 <0.30   Fasting Lipid Panel:  Basename 11/12/11 0541  CHOL 186  HDL 25*  LDLCALC 130*  TRIG 157*  CHOLHDL 7.4  LDLDIRECT --   Thyroid Function Tests:  Basename 11/11/11 1550  TSH 1.465  T4TOTAL --  T3FREE --  THYROIDAB --     TELE: Mobitz II overnight, no acute events, HR 30-40.  ECG: Unchanged from admission.  SR, Mobitz II block, RBBB, left anterior hemiblock.    Radiology/Studies:  Dg Chest Port 1 View  11/11/2011  *RADIOLOGY REPORT*  Clinical Data: Huston Foley cardia  PORTABLE CHEST - 1 VIEW  Comparison: None.  Findings: The lungs are clear.  There is moderate cardiomegaly present.  Harrington rods are noted for fusion of the lower thoracic spine.  IMPRESSION: Cardiomegaly.  No active lung disease.  Original Report Authenticated By: Juline Patch, M.D.   2D Echo: 11/11/11 Study  Conclusions  - Left ventricle: The cavity size was mildly dilated. Wall thickness was normal. Systolic function was normal. The estimated ejection fraction was in the range of 60% to 65%. Doppler parameters are consistent with abnormal left ventricular relaxation (grade 1 diastolic dysfunction). - Mitral valve: Mild regurgitation. - Pulmonary arteries: PA peak pressure: 32mm Hg (S).    ASSESSMENT AND PLAN: 1) Mobitz II heart block: Patient remained asymptomatic overnight, only complaint was insomnia and difficulty sleeping in the ICU. CE's negative x3. Patient waiting for EP attending to make rounds for probable PPM today.  Orders written. 2) HTN- Normotensive this morning, will watch BP while pt is bradycardic. Hold benicar.  3) DM- present on admission. On SSI 4) acute renal insuff: Pt getting NS at 50 mL/hr, continue to hydrate.  Arb on hold.     Signed, Nicolasa Ducking NP

## 2011-11-12 NOTE — Plan of Care (Signed)
Problem: Consults Goal: Permanent Pacemaker Patient Education (See Patient Education module for education specifics.)  Outcome: Completed/Met Date Met:  11/12/11 Handouts given to pt & family about his PPM

## 2011-11-12 NOTE — Brief Op Note (Signed)
11/11/2011 - 11/12/2011  1:20 PM  PATIENT:  Antonio Hancock  70 y.o. male  PRE-OPERATIVE DIAGNOSIS:  Heart Block  POST-OPERATIVE DIAGNOSIS: Heart  Block aand bradycardia    PROCEDURE:  Procedure(s): PERMANENT PACEMAKER INSERTION  SURGEON:  Surgeon(s): Duke Salvia, MD  PHYSICIAN ASSISTANT:   ASSISTANTS:    ANESTHESIA:   local and IV sedation     DICTATION: .Other Dictation: Dictation Number [id number]  PLAN OF CARE: Admit to inpatient   PATIENT DISPOSITION:  ICU - extubated and stable.

## 2011-11-12 NOTE — Consult Note (Signed)
Pt smokes 15 cigarettes per day. After a long discussion involving risk factors and pt's views on the matter pt admits he is not interested in quitting. Education info refused.

## 2011-11-13 ENCOUNTER — Inpatient Hospital Stay (HOSPITAL_COMMUNITY): Payer: PRIVATE HEALTH INSURANCE

## 2011-11-13 LAB — BASIC METABOLIC PANEL
Calcium: 9.2 mg/dL (ref 8.4–10.5)
GFR calc non Af Amer: 57 mL/min — ABNORMAL LOW (ref >90)
Sodium: 136 mEq/L (ref 135–145)

## 2011-11-13 MED ORDER — TRAMADOL HCL 50 MG PO TABS: 50.0000 mg | ORAL_TABLET | Freq: Four times a day (QID) | ORAL | Status: DC | PRN

## 2011-11-13 MED ORDER — TRAMADOL HCL 50 MG PO TABS: 50.0000 mg | ORAL_TABLET | Freq: Four times a day (QID) | ORAL | Status: AC | PRN

## 2011-11-13 NOTE — Op Note (Signed)
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  PROCEDURE:  Dual-chamber pacemaker implantation.  Following obtaining informed consent, the patient was brought to the electrophysiology laboratory and placed on the fluoroscopic table in supine position.  After routine prep and drape of the left upper chest, lidocaine was infiltrated in prepectoral subclavicular region.  Incision was made and carried down to the layer of the prepectoral fascia.  Using electrocautery and sharp dissection, a pocket was formed.  Hemostasis was obtained.  Thereafter, attention was turned to gain access to the extrathoracic left subclavian vein, which was accomplished without difficulty without the aspiration of air or puncture of the artery.  Two separate venipunctures were accomplished.  Guidewires were placed and retained and sequentially 7-French sheaths were placed, through which were passed a Medtronic 5076, 58-cm active fixation ventricular lead, serial number [id number] and a Medtronic 5076, 52-cm active fixation atrial lead, serial number [id number].  The ventricular lead was manipulated at the right ventricular apex where it was marked with a tie prior to the insertion of the atrial lead.  The bipolar R-wave was 9.5 with a pace impedance of 580, a threshold of 1 V at 0.5.  Current of injury was brisk.  There was no diaphragmatic pacing at 10 V and current of threshold was 1.8 mA.  Bipolar P-wave was 1.6 with a pace impedance of 1096, a threshold of 0.6 V at 0.5 msec.   Current of injury was moderate.  There was no diaphragmatic pacing at 10 V and the current of injury was 1.6 mA.  With these acceptable parameters recorded, the leads were attached to a Medtronic Adapta L pulse generator, serial number [id number]. Ventricular pacing and P synchronous pacing were identified.  The pocket was copiously irrigated with antibiotic-containing saline solution. Hemostasis was obtained.  The leads and pulse generator were placed in the pocket and secured to the prepectoral fascia.  The wound was closed in 2 layers in normal fashion.  The wound was washed, dried, and a benzoin and Steri-Strip dressing was applied.  Needle counts, sponge counts, and instrument counts were correct at the end of the procedure according to the staff.  The patient tolerated the procedure without apparent complication.    Duke Salvia, MD, [facility]    SCK/MEDQ  D:  11/12/2011  T:  11/13/2011  Job:  [id number]

## 2011-11-13 NOTE — Progress Notes (Addendum)
Discharge instructions given. Patient and wife awaiting arrival of niece for discharge.

## 2011-11-13 NOTE — Discharge Summary (Signed)
Discharge Summary   Patient ID: Antonio Hancock MRN: [id number], DOB/AGE: 1941/05/19 70 y.o.  Primary MD: Benedetto Goad MD Primary Cardiologist: Marca Ancona MD Admit date: 11/11/2011 D/C date:     11/13/2011      Primary Discharge Diagnoses:  1. 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 Hyperlipidemia - LDL 130 2. Diabetes Mellitus, Type 2  3. Hypertension 4. Tobacco abuse  Allergies No Known Allergies  Diagnostic Studies/Procedures:   11/11/11 - 2D Echocardiogram - Left ventricle: The cavity size was mildly dilated. Wall thickness was normal. Systolic function was normal. The estimated ejection fraction was in the range of 60% to 65%. Doppler parameters are consistent with abnormal left ventricular relaxation (grade 1 diastolic dysfunction). - Mitral valve: Mild regurgitation. - Pulmonary arteries: PA peak pressure: 32mm Hg (S).  11/12/11 - Dual-chamber pacemaker implantation Medtronic 5076, 58-cm active fixation ventricular lead, serial number [id number] and a Medtronic 5076, 52-cm active fixation atrial lead, serial number [id number].  The bipolar R-wave was 9.5 with a pace impedance of 580, a threshold of 1 V at 0.5. Current of injury was brisk. There was no diaphragmatic pacing at 10 V and current of threshold was 1.8 mA. Bipolar P-wave was 1.6 with a pace impedance of 1096, a threshold of 0.6 V at 0.5 msec. Current of injury was moderate. There was no diaphragmatic pacing at 10 V and the current of injury was 1.6 mA. With these acceptable parameters recorded, the leads were attached to a Medtronic Adapta L pulse generator, serial number [id number]. Ventricular pacing and P synchronous pacing were identified.   History of Present Illness: 70yom w/ PMHx significant for HTN, DMII, and tobacco abuse who presented to [facility] hospital from his primary care office on 11/11/11 for newly  discovered/asymptomatic bradycardia.  Over the past 3-4 months, the patient noted intermittent dizziness, usually while moving from a lying or seated position to a standing position. He is pretty active, walking 1 mile daily without limitations. He checks his BP regularly and noted in the past week that his HR dipped into 30's at times, but was asymptomatic. He went to [facility] on day of admission for his scheduled 90-day follow-up appointment for diabetes and HTN management. He was noted to be bradycardic in the 30's, an ECG was obtained that showed 2:1 AV-block with LAD+RBBB, left ant. fasicular block, PR , QRS , QTc  and EMS was called for transport to the ED.  Hospital Course: In the ED he remained asymptomatic with stable vital signs. His initial cardiac enzymes were negative and CXR showed moderate cardiomegaly. He was given 0.5 mg of IV atropine with minimal ventricular response. He was admitted to the CCU and a 2D echo was obtained with findings as noted above. On 01/12/11 he underwent dual chamber pacemaker implantation as noted above. He tolerated the procedure well without complication and [ost pacemaker CXR was without pneumothorax.  Due to his bradycardia his Benicar was held. His creatinine was 1.28 on admission and increased to 1.60 the following day, which was thought to be due to hypoperfusion and improved to 1.24 on day of discharge. His Benicar was resumed at discharge and he will have a follow up BMET on Monday at his PCPs office.  His lipid panel was remarkable for LDL of 130. He reports this is being followed by his PCP. It is recommended that he be started on a statin as an  outpatient.  He is being discharged in stable condition to home with plans for device clinic follow up in 7-10 days as well as follow up with Dr. Graciela Husbands in 3 months.  Discharge Vitals: Blood pressure 145/71, pulse 60, temperature 98.8 F (37.1 C), temperature source Oral, resp.  rate 20, height 6' (1.829 m), weight 83.7 kg (184 lb 8.4 oz), SpO2 96.00%.  Labs: Lab Results  Component Value Date   WBC 5.8 11/11/2011   HGB 14.2 11/11/2011   HCT 40.6 11/11/2011   MCV 89.4 11/11/2011   PLT 211 11/11/2011    Lab 11/13/11 0500 11/11/11 1105  NA 136 --  K 4.2 --  CL 103 --  CO2 23 --  BUN 20 --  CREATININE 1.24 --  CALCIUM 9.2 --  PROT -- 7.9  BILITOT -- 0.4  ALKPHOS -- 104  ALT -- 21  AST -- 20  GLUCOSE 90 --    Basename 11/12/11 0010 11/11/11 1745 11/11/11 1542 11/11/11 1106  CKTOTAL 107 62 64 64  CKMB 2.0 2.1 2.2 2.3  TROPONINI <0.30 <0.30 <0.30 <0.30   Lab Results  Component Value Date   CHOL 186 11/12/2011   HDL 25* 11/12/2011   LDLCALC 130* 11/12/2011   TRIG 157* 11/12/2011     11/11/2011 15:50  TSH 1.465    Discharge Medications   Current Discharge Medication List    START taking these medications   Details  traMADol (ULTRAM) 50 MG tablet Take 1 tablet (50 mg total) by mouth every 6 (six) hours as needed for pain. Maximum dose= 8 tablets per day Qty: 10 tablet, Refills: 0      CONTINUE these medications which have NOT CHANGED   Details  aspirin EC 81 MG tablet Take 81 mg by mouth daily.      metFORMIN (GLUCOPHAGE-XR) 500 MG 24 hr tablet Take 500 mg by mouth daily with supper.      Multiple Vitamins-Minerals (MULTIVITAMINS THER. W/MINERALS) TABS Take 1 tablet by mouth daily.      olmesartan (BENICAR) 20 MG tablet Take 20 mg by mouth daily.          Disposition    Follow-up Information    Follow up with [facility] in 10 days. (Device clinic for wound check. Office will call you with appointment time.)    Contact information:   [address] [place] [address] [phone]      Follow up with Sherryl Manges, MD in 3 months. (Office will call you with appointment time.)    Contact information:   [facility] [address], [address] [place]  [address] [phone]       Follow up with Pamelia Hoit. Make an appointment on 11/15/2011. (Please have lab work Designer, jewellery) checked on Monday with your Primary Care Provider)    Contact information:   [address] [facility] [place] [address] [phone]           Outstanding Labs/Studies:  1. BMET on Monday with primary care 2. Follow up elevated lipid levels with primary care.  Duration of Discharge Encounter: Greater than 30 minutes including physician and PA time.  Signed, Kashmir Lysaght PA-C 11/13/2011, 10:52 AM

## 2011-11-13 NOTE — Progress Notes (Signed)
Patient ID: Antonio Hancock, male   DOB: 1941/10/17, 70 y.o.   MRN: [id number] SUBJECTIVE: Feeling well this up and walking. No SOB, minimal CP over pacer sight. CREATININE is down and he wants to go home.  Filed Vitals:   11/13/11 0500 11/13/11 0600 11/13/11 0700 11/13/11 0807  BP: 159/75 151/71 156/77   Pulse:      Temp:    98.8 F (37.1 C)  TempSrc:    Oral  Resp:    20  Height:      Weight: 83.7 kg (184 lb 8.4 oz)     SpO2: 98% 96% 97% 96%    Intake/Output Summary (Last 24 hours) at 11/13/11 0910 Last data filed at 11/13/11 0612  Gross per 24 hour  Intake    990 ml  Output   2875 ml  Net  -1885 ml    LABS: Basic Metabolic Panel:  Basename 11/13/11 0500 11/12/11 0541 11/11/11 1550  NA 136 138 --  K 4.2 4.4 --  CL 103 105 --  CO2 23 23 --  GLUCOSE 90 102* --  BUN 20 24* --  CREATININE 1.24 1.60* --  CALCIUM 9.2 9.5 --  MG -- -- 2.1  PHOS -- -- --   Liver Function Tests:  Basename 11/11/11 1105  AST 20  ALT 21  ALKPHOS 104  BILITOT 0.4  PROT 7.9  ALBUMIN 3.8   No results found for this basename: LIPASE:2,AMYLASE:2 in the last 72 hours CBC:  Basename 11/11/11 1105  WBC 5.8  NEUTROABS 3.7  HGB 14.2  HCT 40.6  MCV 89.4  PLT 211   Cardiac Enzymes:  Basename 11/12/11 0010 11/11/11 1745 11/11/11 1542  CKTOTAL 107 62 64  CKMB 2.0 2.1 2.2  CKMBINDEX -- -- --  TROPONINI <0.30 <0.30 <0.30   BNP: No results found for this basename: POCBNP:3 in the last 72 hours D-Dimer: No results found for this basename: DDIMER:2 in the last 72 hours Hemoglobin A1C: No results found for this basename: HGBA1C in the last 72 hours Fasting Lipid Panel:  Basename 11/12/11 0541  CHOL 186  HDL 25*  LDLCALC 130*  TRIG 157*  CHOLHDL 7.4  LDLDIRECT --   Thyroid Function Tests:  Basename 11/11/11 1550  TSH 1.465  T4TOTAL --  T3FREE --  THYROIDAB --   Anemia Panel: No results found for this basename: VITAMINB12,FOLATE,FERRITIN,TIBC,IRON,RETICCTPCT in the last 72  hours  RADIOLOGY: Dg Chest Port 1 View  11/11/2011  *RADIOLOGY REPORT*  Clinical Data: Huston Foley cardia  PORTABLE CHEST - 1 VIEW  Comparison: None.  Findings: The lungs are clear.  There is moderate cardiomegaly present.  Harrington rods are noted for fusion of the lower thoracic spine.  IMPRESSION: Cardiomegaly.  No active lung disease.  Original Report Authenticated By: Juline Patch, M.D.    PHYSICAL EXAM General: Well developed, well nourished, in no acute distress Head: Eyes PERRLA, No xanthomas.   Normal cephalic and atramatic  Lungs: Clear bilaterally to auscultation and percussion. Heart: HRRR S1 S2, with soft S4 murmur.  Pulses are 2+ & equal.            No carotid bruit. No JVD.  No abdominal bruits. No femoral bruits. Abdomen: Bowel sounds are positive, abdomen soft and non-tender without masses or                  Hernia's noted. Msk:  Back normal, normal gait. Normal strength and tone for age. Extremities: No clubbing, cyanosis or edema.  DP +1 Neuro: Alert and oriented X 3. Psych:  Good affect, responds appropriately  TELEMETRY: Reviewed telemetry pt in Atrial pacing, EKG with RBBB:  ASSESSMENT AND PLAN:  Principal Problem:  *AV block, 2nd degree Active Problems:  Hypertension  Diabetes mellitus  Bradycardia   He is S/P Pacer. Need to check CXR this am and will restart Benicar 20mg  q day. He will need a BMET on Monday to check Creatinine and K. We have made that clear to him and will help arrange. Valera Castle, MD 11/13/2011 9:10 AM

## 2011-11-25 ENCOUNTER — Ambulatory Visit (INDEPENDENT_AMBULATORY_CARE_PROVIDER_SITE_OTHER): Payer: PRIVATE HEALTH INSURANCE | Admitting: *Deleted

## 2011-11-25 ENCOUNTER — Encounter: Payer: Self-pay | Admitting: Internal Medicine

## 2011-11-25 DIAGNOSIS — I498 Other specified cardiac arrhythmias: Secondary | ICD-10-CM

## 2011-11-25 DIAGNOSIS — R001 Bradycardia, unspecified: Secondary | ICD-10-CM

## 2011-11-25 DIAGNOSIS — I441 Atrioventricular block, second degree: Secondary | ICD-10-CM

## 2011-11-25 LAB — PACEMAKER DEVICE OBSERVATION
AL IMPEDENCE PM: 427 Ohm
ATRIAL PACING PM: 49
BATTERY VOLTAGE: 2.79 V
RV LEAD IMPEDENCE PM: 602 Ohm

## 2011-11-25 NOTE — Progress Notes (Signed)
Wound check-PPM 

## 2012-02-15 ENCOUNTER — Encounter: Payer: Self-pay | Admitting: Internal Medicine

## 2012-02-15 ENCOUNTER — Ambulatory Visit (INDEPENDENT_AMBULATORY_CARE_PROVIDER_SITE_OTHER): Payer: PRIVATE HEALTH INSURANCE | Admitting: Internal Medicine

## 2012-02-15 DIAGNOSIS — F17200 Nicotine dependence, unspecified, uncomplicated: Secondary | ICD-10-CM

## 2012-02-15 DIAGNOSIS — Z950 Presence of cardiac pacemaker: Secondary | ICD-10-CM

## 2012-02-15 DIAGNOSIS — I10 Essential (primary) hypertension: Secondary | ICD-10-CM

## 2012-02-15 DIAGNOSIS — I4891 Unspecified atrial fibrillation: Secondary | ICD-10-CM

## 2012-02-15 DIAGNOSIS — I441 Atrioventricular block, second degree: Secondary | ICD-10-CM

## 2012-02-15 DIAGNOSIS — Z720 Tobacco use: Secondary | ICD-10-CM

## 2012-02-15 LAB — PACEMAKER DEVICE OBSERVATION
AL THRESHOLD: 0.75 V
ATRIAL PACING PM: 44
BAMS-0001: 175 {beats}/min
RV LEAD AMPLITUDE: 8 mv
RV LEAD THRESHOLD: 0.5 V

## 2012-02-15 MED ORDER — OLMESARTAN MEDOXOMIL-HCTZ 20-12.5 MG PO TABS: 1.0000 | ORAL_TABLET | Freq: Every day | ORAL | Status: AC

## 2012-02-15 NOTE — Progress Notes (Signed)
  HPI  Antonio Hancock is a 71 y.o. male Seen in followup for pacer implanted Dec 2012 for bifascicular block with 2:1 av conduction   The patient denies chest pain, shortness of breath, nocturnal dyspnea, orthopnea or peripheral edema.  There have been no palpitations, lightheadedness or syncope.  No device pocket issues  Still smoking  Gained weight when he tried to quit Past Medical History  Diagnosis Date  . Hypertension   . Diabetes mellitus     Diagnosed Fall 2012  . Mobitz type 2 second degree AV block     11/11/2011  . Tobacco abuse     1/2 ppd x 55 yrs    Past Surgical History  Procedure Date  . Back surgery     Current Outpatient Prescriptions  Medication Sig Dispense Refill  . aspirin EC 81 MG tablet Take 81 mg by mouth daily.        Marland Kitchen atorvastatin (LIPITOR) 10 MG tablet Take 10 mg by mouth daily.      . metFORMIN (GLUCOPHAGE-XR) 500 MG 24 hr tablet Take 500 mg by mouth daily with supper.        . Multiple Vitamins-Minerals (MULTIVITAMINS THER. W/MINERALS) TABS Take 1 tablet by mouth daily.        Marland Kitchen olmesartan (BENICAR) 20 MG tablet Take 20 mg by mouth daily.          No Known Allergies  Review of Systems negative except from HPI and PMH  Physical Exam BP 155/85  Pulse 77  Ht 5' 11.5" (1.816 m)  Wt 194 lb 12.8 oz (88.361 kg)  BMI 26.79 kg/m2 Well developed and well nourished in no acute distress HENT normal E scleral and icterus clear Neck Supple JVP flat; carotids brisk and full Clear to ausculation Pacer pocket well healed Regular rate and rhythm, no murmurs gallops or rub Soft with active bowel sounds No clubbing cyanosis none Edema Alert and oriented, grossly normal motor and sensory function Skin Warm and Dry   Assessment and  Plan

## 2012-02-15 NOTE — Patient Instructions (Addendum)
Your physician recommends that you schedule a follow-up appointment in: 3 months with Kristin/Paula for a device check.  Your physician wants you to follow-up in: December 2013 with Dr. Graciela Husbands. You will receive a reminder letter in the mail two months in advance. If you don't receive a letter, please call our office to schedule the follow-up appointment.  Your physician has recommended you make the following change in your medication:  1) Stop plain Benicar when you complete the current tablets that you have, then start 2) Benicar/ HCT 20/12.5 mg once daily.

## 2012-02-15 NOTE — Assessment & Plan Note (Signed)
Blood pressure elevated  Will add HCTZ

## 2012-02-15 NOTE — Assessment & Plan Note (Signed)
Detected atrial fibrillation on his device  Will follow as his CHADS VAS score would prompt oral anticoagulation

## 2012-02-15 NOTE — Assessment & Plan Note (Signed)
Stable post pacing 

## 2012-02-15 NOTE — Assessment & Plan Note (Signed)
The patient's device was interrogated and the information was fully reviewed.  The device was reprogrammed to  Maximize longevity   There was one episode of afib

## 2012-02-15 NOTE — Assessment & Plan Note (Signed)
encourgae to stop and go to the smoking class

## 2012-05-10 ENCOUNTER — Encounter: Payer: Self-pay | Admitting: *Deleted

## 2012-05-17 ENCOUNTER — Encounter: Payer: Self-pay | Admitting: Internal Medicine

## 2012-05-17 ENCOUNTER — Ambulatory Visit (INDEPENDENT_AMBULATORY_CARE_PROVIDER_SITE_OTHER): Payer: PRIVATE HEALTH INSURANCE | Admitting: *Deleted

## 2012-05-17 DIAGNOSIS — I4891 Unspecified atrial fibrillation: Secondary | ICD-10-CM

## 2012-05-17 DIAGNOSIS — I441 Atrioventricular block, second degree: Secondary | ICD-10-CM

## 2012-05-17 LAB — PACEMAKER DEVICE OBSERVATION
AL IMPEDENCE PM: 463 Ohm
BATTERY VOLTAGE: 2.79 V
RV LEAD AMPLITUDE: 11.2 mv
RV LEAD IMPEDENCE PM: 602 Ohm

## 2012-05-17 NOTE — Progress Notes (Signed)
Pacer check in clinic  

## 2012-11-14 ENCOUNTER — Encounter: Payer: Self-pay | Admitting: Internal Medicine

## 2012-11-14 ENCOUNTER — Ambulatory Visit (INDEPENDENT_AMBULATORY_CARE_PROVIDER_SITE_OTHER): Payer: PRIVATE HEALTH INSURANCE | Admitting: Internal Medicine

## 2012-11-14 VITALS — BP 150/81 | HR 74 | Resp 18 | Ht 71.0 in | Wt 202.0 lb

## 2012-11-14 DIAGNOSIS — Z950 Presence of cardiac pacemaker: Secondary | ICD-10-CM

## 2012-11-14 DIAGNOSIS — Z720 Tobacco use: Secondary | ICD-10-CM

## 2012-11-14 DIAGNOSIS — I442 Atrioventricular block, complete: Secondary | ICD-10-CM

## 2012-11-14 DIAGNOSIS — G4733 Obstructive sleep apnea (adult) (pediatric): Secondary | ICD-10-CM | POA: Insufficient documentation

## 2012-11-14 DIAGNOSIS — I4891 Unspecified atrial fibrillation: Secondary | ICD-10-CM

## 2012-11-14 DIAGNOSIS — I10 Essential (primary) hypertension: Secondary | ICD-10-CM

## 2012-11-14 DIAGNOSIS — I441 Atrioventricular block, second degree: Secondary | ICD-10-CM

## 2012-11-14 DIAGNOSIS — F17200 Nicotine dependence, unspecified, uncomplicated: Secondary | ICD-10-CM

## 2012-11-14 LAB — PACEMAKER DEVICE OBSERVATION
AL THRESHOLD: 0.5 V
ATRIAL PACING PM: 0
BATTERY VOLTAGE: 2.79 V
RV LEAD THRESHOLD: 0.75 V

## 2012-11-14 NOTE — Progress Notes (Signed)
  HPI  Antonio Hancock is a 71 y.o. male Seen in followup for pacer implanted Dec 2012 for bifascicular block with 2:1 av conduction   The patient denies chest pain, shortness of breath, nocturnal dyspnea, orthopnea or peripheral edema.  There have been no palpitations, lightheadedness or syncope.  No device pocket issues  His blood pressure has been elevated. There were some issues with renal function associated with prior medications and so he has been restarted On ARB/HCT  his blood pressure still elevated.  He snores, awakens with startle and has signif daytime sleepiness  Past Medical History  Diagnosis Date  . Hypertension   . Diabetes mellitus     Diagnosed Fall 2012  . Mobitz type 2 second degree AV block     11/11/2011  . Tobacco abuse     1/2 ppd x 55 yrs  . Pacemaker     2012    Past Surgical History  Procedure Date  . Back surgery     Current Outpatient Prescriptions  Medication Sig Dispense Refill  . aspirin EC 81 MG tablet Take 81 mg by mouth daily.        Marland Kitchen atorvastatin (LIPITOR) 10 MG tablet Take 10 mg by mouth daily.      . metFORMIN (GLUCOPHAGE-XR) 500 MG 24 hr tablet Take 500 mg by mouth daily with supper.        . Multiple Vitamins-Minerals (MULTIVITAMINS THER. W/MINERALS) TABS Take 1 tablet by mouth daily.        Marland Kitchen olmesartan-hydrochlorothiazide (BENICAR HCT) 20-12.5 MG per tablet Take 1 tablet by mouth daily.  30 tablet  6  . [DISCONTINUED] olmesartan (BENICAR) 20 MG tablet Take 20 mg by mouth daily.          No Known Allergies  Review of Systems negative except from HPI and PMH  Physical Exam BP 150/81  Pulse 74  Resp 18  Ht 5\' 11"  (1.803 m)  Wt 202 lb (91.627 kg)  BMI 28.17 kg/m2  SpO2 99% Well developed and well nourished in no acute distress HENT normal E scleral and icterus clear Neck Supple JVP7-8 carotids brisk and full Clear to ausculation Pacer pocket well healed Regular rate and rhythm, no murmurs gallops or rub Soft with  active bowel sounds No clubbing cyanosis none Edema Alert and oriented, grossly normal motor and sensory function Skin Warm and Dry   Assessment and  Plan

## 2012-11-14 NOTE — Assessment & Plan Note (Signed)
Patient has significant symptoms consistent with sleep apnea and difficult to control hypertension. I thought rather than adding more medications, with the higher pretest probability, it makes sense to do a sleep study. His blood pressure being managed well by his PCP

## 2012-11-14 NOTE — Assessment & Plan Note (Signed)
As above.

## 2012-11-14 NOTE — Assessment & Plan Note (Signed)
The patient's device was interrogated.  The information was reviewed. No changes were made in the programming.    

## 2012-11-14 NOTE — Patient Instructions (Signed)
Remote monitoring is used to monitor your Pacemaker of ICD from home. This monitoring reduces the number of office visits required to check your device to one time per year. It allows [place] to keep an eye on the functioning of your device to ensure it is working properly. You are scheduled for a device check from home on 02/19/2013. You may send your transmission at any time that day. If you have a wireless device, the transmission will be sent automatically. After your physician reviews your transmission, you will receive a postcard with your next transmission date.  Your physician wants you to follow-up in: 1 year with Dr. Graciela Husbands.  You will receive a reminder letter in the mail two months in advance. If you don't receive a letter, please call our office to schedule the follow-up appointment.   Your physician has recommended that you have a 2 night Night Watch sleep study. This test records several body functions during sleep, including: brain activity, eye movement, oxygen and carbon dioxide blood levels, heart rate and rhythm, breathing rate and rhythm, the flow of air through your mouth and nose, snoring, body muscle movements, and chest and belly movement.

## 2012-11-14 NOTE — Assessment & Plan Note (Signed)
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

## 2012-11-14 NOTE — Assessment & Plan Note (Signed)
Still smking without plans to stop

## 2013-01-05 ENCOUNTER — Encounter: Payer: Self-pay | Admitting: *Deleted

## 2013-01-15 ENCOUNTER — Encounter: Payer: Self-pay | Admitting: Internal Medicine

## 2013-02-19 ENCOUNTER — Other Ambulatory Visit: Payer: Self-pay

## 2013-02-19 ENCOUNTER — Ambulatory Visit (INDEPENDENT_AMBULATORY_CARE_PROVIDER_SITE_OTHER): Payer: PRIVATE HEALTH INSURANCE | Admitting: *Deleted

## 2013-02-19 DIAGNOSIS — I442 Atrioventricular block, complete: Secondary | ICD-10-CM

## 2013-02-19 DIAGNOSIS — Z950 Presence of cardiac pacemaker: Secondary | ICD-10-CM

## 2013-02-25 LAB — REMOTE PACEMAKER DEVICE
AL AMPLITUDE: 2.8 mv
AL IMPEDENCE PM: 399 Ohm
AL THRESHOLD: 0.5 V
BATTERY VOLTAGE: 2.79 V

## 2013-03-08 ENCOUNTER — Encounter: Payer: Self-pay | Admitting: *Deleted

## 2013-03-26 ENCOUNTER — Encounter: Payer: Self-pay | Admitting: Internal Medicine

## 2013-05-28 ENCOUNTER — Encounter: Payer: PRIVATE HEALTH INSURANCE | Admitting: *Deleted

## 2013-06-05 ENCOUNTER — Encounter: Payer: Self-pay | Admitting: *Deleted

## 2013-06-13 ENCOUNTER — Telehealth: Payer: Self-pay | Admitting: Internal Medicine

## 2013-06-13 NOTE — Telephone Encounter (Signed)
Spoke w/pt in regards to remote transmissions. Pt was offered to be set up thru [employer] but declined at this time. Pt would rather do office checks at this time. Pt was instructed that due in November with SK and letter will be sent to pt.

## 2013-06-13 NOTE — Telephone Encounter (Signed)
New Problem:    Patient called in because he switched to [employer] and everything is wireless so he is unable to sent in his remote checks.  Patient would like to know how to proceed.  Please call back.

## 2013-11-14 ENCOUNTER — Encounter: Payer: Self-pay | Admitting: Internal Medicine

## 2013-11-14 ENCOUNTER — Ambulatory Visit (INDEPENDENT_AMBULATORY_CARE_PROVIDER_SITE_OTHER): Payer: Commercial Indemnity | Admitting: Internal Medicine

## 2013-11-14 VITALS — BP 178/87 | HR 62 | Ht 71.0 in | Wt 197.8 lb

## 2013-11-14 DIAGNOSIS — Z950 Presence of cardiac pacemaker: Secondary | ICD-10-CM

## 2013-11-14 DIAGNOSIS — I495 Sick sinus syndrome: Secondary | ICD-10-CM

## 2013-11-14 DIAGNOSIS — I441 Atrioventricular block, second degree: Secondary | ICD-10-CM

## 2013-11-14 DIAGNOSIS — I442 Atrioventricular block, complete: Secondary | ICD-10-CM

## 2013-11-14 LAB — MDC_IDC_ENUM_SESS_TYPE_INCLINIC
Battery Remaining Longevity: 119 mo
Lead Channel Pacing Threshold Amplitude: 1.5 V
Lead Channel Pacing Threshold Pulse Width: 0.4 ms
Lead Channel Sensing Intrinsic Amplitude: 11.2 mV
Lead Channel Setting Pacing Amplitude: 2 V
Lead Channel Setting Pacing Pulse Width: 0.4 ms

## 2013-11-14 NOTE — Progress Notes (Signed)
      Patient Care Team: Barbie Banner, MD as PCP - General (Family Medicine)   HPI  Antonio Hancock is a 72 y.o. 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 There have been no palpitations, lightheadedness or syncope.  No device pocket issues  His blood pressure has been elevated. There were some issues with renal function associated with prior medications and so he has been restarted  On ARB/HCT his blood pressure still elevated.  He snores, awakens with startle and has signif daytime sleepiness  He continues to smoke;  Significant DOE  Past Medical History  Diagnosis Date  . Hypertension   . Diabetes mellitus     Diagnosed Fall 2012  . Mobitz type 2 second degree AV block     11/11/2011  . Tobacco abuse     1/2 ppd x 55 yrs  . Pacemaker     2012    Past Surgical History  Procedure Laterality Date  . Back surgery      Current Outpatient Prescriptions  Medication Sig Dispense Refill  . ALPRAZolam (XANAX) 0.25 MG tablet as needed.      Marland Kitchen amLODipine (NORVASC) 2.5 MG tablet As directed      . aspirin EC 81 MG tablet Take 81 mg by mouth daily.        Marland Kitchen atorvastatin (LIPITOR) 10 MG tablet Take 10 mg by mouth daily.      . metFORMIN (GLUCOPHAGE-XR) 500 MG 24 hr tablet Take 500 mg by mouth daily with supper.        . Multiple Vitamins-Minerals (MULTIVITAMINS THER. W/MINERALS) TABS Take 1 tablet by mouth daily.        Marland Kitchen olmesartan (BENICAR) 40 MG tablet Take 40 mg by mouth daily.       No current facility-administered medications for this visit.    No Known Allergies  Review of Systems negative except from HPI and PMH  Physical Exam BP 178/87  Pulse 62  Ht 5\' 11"  (1.803 m)  Wt 197 lb 12.8 oz (89.721 kg)  BMI 27.60 kg/m2 Well developed and well nourished in no acute distress HENT normal E scleral and icterus clear Neck Supple JVP  flat; carotids brisk and full ronchi and wheezes Device pocket well healed; without hematoma or erythema.  There is no tethering Regular rate and rhythm, no murmurs gallops or rub Soft with active bowel sounds No clubbing cyanosis none Edema Alert and oriented, grossly normal motor and sensory function Skin Warm and Dry  ecg  P-synchronous/ AV  pacing   Assessment and  Plan

## 2013-11-14 NOTE — Assessment & Plan Note (Signed)
No afiv

## 2013-11-14 NOTE — Patient Instructions (Addendum)
Your physician recommends that you continue on your current medications as directed. Please refer to the Current Medication list given to you today.  Remote monitoring is used to monitor your Pacemaker of ICD from home. This monitoring reduces the number of office visits required to check your device to one time per year. It allows [place] to keep an eye on the functioning of your device to ensure it is working properly. You are scheduled for a device check from home on 02/15/2014. You may send your transmission at any time that day. If you have a wireless device, the transmission will be sent automatically. After your physician reviews your transmission, you will receive a postcard with your next transmission date.  Your physician wants you to follow-up in: one year with Rick Duff, PAC.  You will receive a reminder letter in the mail two months in advance. If you don't receive a letter, please call our office to schedule the follow-up appointment.   Smoking program: [facility] [phone]  1-800-STOP-NOW

## 2013-11-14 NOTE — Assessment & Plan Note (Signed)
He is only 2% of his beats faster than 100 beats per minute. We'll activate rate response. We have also reprogrammed his device from A.-D. to allow for intrinsic conduction

## 2013-11-14 NOTE — Assessment & Plan Note (Signed)
The patient's device was interrogated and the information was fully reviewed.  The device was reprogrammed to  A.-D. to allow for intrinsic conduction and activated rate response

## 2013-11-14 NOTE — Assessment & Plan Note (Signed)
Counseled re patches, lozenges and [id number]

## 2013-11-14 NOTE — Assessment & Plan Note (Signed)
SAYS BP 130s at home so will continue to follow on current meds

## 2014-02-15 ENCOUNTER — Ambulatory Visit (INDEPENDENT_AMBULATORY_CARE_PROVIDER_SITE_OTHER): Payer: Commercial Indemnity | Admitting: *Deleted

## 2014-02-15 DIAGNOSIS — I442 Atrioventricular block, complete: Secondary | ICD-10-CM

## 2014-02-15 DIAGNOSIS — Z950 Presence of cardiac pacemaker: Secondary | ICD-10-CM

## 2014-02-15 LAB — MDC_IDC_ENUM_SESS_TYPE_REMOTE
Battery Remaining Longevity: 133 mo
Brady Statistic AP VP Percent: 15 %
Brady Statistic AS VP Percent: 12 %
Lead Channel Impedance Value: 431 Ohm
Lead Channel Pacing Threshold Amplitude: 0.5 V
Lead Channel Sensing Intrinsic Amplitude: 0.7 mV
Lead Channel Sensing Intrinsic Amplitude: 5.6 mV
Lead Channel Setting Pacing Pulse Width: 0.4 ms
MDC IDC MSMT BATTERY IMPEDANCE: 136 Ohm
MDC IDC MSMT BATTERY VOLTAGE: 2.79 V
MDC IDC MSMT LEADCHNL RA IMPEDANCE VALUE: 432 Ohm
MDC IDC MSMT LEADCHNL RA PACING THRESHOLD PULSEWIDTH: 0.4 ms
MDC IDC MSMT LEADCHNL RV PACING THRESHOLD AMPLITUDE: 1.375 V
MDC IDC MSMT LEADCHNL RV PACING THRESHOLD PULSEWIDTH: 0.4 ms
MDC IDC SESS DTM: 20150313122849
MDC IDC SET LEADCHNL RA PACING AMPLITUDE: 2 V
MDC IDC SET LEADCHNL RV PACING AMPLITUDE: 2.75 V
MDC IDC SET LEADCHNL RV SENSING SENSITIVITY: 4 mV
MDC IDC STAT BRADY AP VS PERCENT: 49 %
MDC IDC STAT BRADY AS VS PERCENT: 24 %

## 2014-04-04 ENCOUNTER — Encounter: Payer: Self-pay | Admitting: *Deleted

## 2014-04-19 ENCOUNTER — Encounter: Payer: Self-pay | Admitting: Internal Medicine

## 2014-05-21 ENCOUNTER — Ambulatory Visit (INDEPENDENT_AMBULATORY_CARE_PROVIDER_SITE_OTHER): Payer: Commercial Indemnity | Admitting: *Deleted

## 2014-05-21 DIAGNOSIS — I442 Atrioventricular block, complete: Secondary | ICD-10-CM

## 2014-05-21 LAB — MDC_IDC_ENUM_SESS_TYPE_REMOTE
Battery Remaining Longevity: 121 mo
Battery Voltage: 2.79 V
Brady Statistic AP VP Percent: 13.2 %
Lead Channel Pacing Threshold Amplitude: 0.5 V
Lead Channel Pacing Threshold Amplitude: 1.125 V
Lead Channel Pacing Threshold Pulse Width: 0.4 ms
Lead Channel Setting Pacing Amplitude: 2 V
Lead Channel Setting Pacing Amplitude: 2.75 V
Lead Channel Setting Pacing Pulse Width: 0.4 ms
Lead Channel Setting Sensing Sensitivity: 4 mV
MDC IDC MSMT LEADCHNL RA IMPEDANCE VALUE: 438 Ohm
MDC IDC MSMT LEADCHNL RA PACING THRESHOLD PULSEWIDTH: 0.4 ms
MDC IDC MSMT LEADCHNL RA SENSING INTR AMPL: 0.7 mV
MDC IDC MSMT LEADCHNL RV IMPEDANCE VALUE: 435 Ohm
MDC IDC MSMT LEADCHNL RV SENSING INTR AMPL: 5.6 mV
MDC IDC STAT BRADY AP VS PERCENT: 53.2 %
MDC IDC STAT BRADY AS VP PERCENT: 12.1 %
MDC IDC STAT BRADY AS VS PERCENT: 21.5 %

## 2014-05-21 NOTE — Progress Notes (Signed)
Remote pacemaker transmission.   

## 2014-06-11 ENCOUNTER — Encounter: Payer: Self-pay | Admitting: Cardiology

## 2014-06-19 ENCOUNTER — Encounter: Payer: Self-pay | Admitting: Internal Medicine

## 2014-08-22 ENCOUNTER — Ambulatory Visit (INDEPENDENT_AMBULATORY_CARE_PROVIDER_SITE_OTHER): Payer: Commercial Indemnity | Admitting: *Deleted

## 2014-08-22 ENCOUNTER — Encounter: Payer: Self-pay | Admitting: Internal Medicine

## 2014-08-22 DIAGNOSIS — I442 Atrioventricular block, complete: Secondary | ICD-10-CM

## 2014-08-22 NOTE — Progress Notes (Signed)
Remote pacemaker transmission.   

## 2014-08-23 LAB — MDC_IDC_ENUM_SESS_TYPE_REMOTE
Brady Statistic AP VS Percent: 55 %
Brady Statistic AS VS Percent: 23 %
Lead Channel Impedance Value: 436 Ohm
Lead Channel Pacing Threshold Pulse Width: 0.4 ms
Lead Channel Pacing Threshold Pulse Width: 0.4 ms
Lead Channel Sensing Intrinsic Amplitude: 8 mV
Lead Channel Setting Pacing Amplitude: 2 V
Lead Channel Setting Sensing Sensitivity: 4 mV
MDC IDC MSMT BATTERY IMPEDANCE: 160 Ohm
MDC IDC MSMT BATTERY REMAINING LONGEVITY: 127 mo
MDC IDC MSMT BATTERY VOLTAGE: 2.79 V
MDC IDC MSMT LEADCHNL RA IMPEDANCE VALUE: 409 Ohm
MDC IDC MSMT LEADCHNL RA PACING THRESHOLD AMPLITUDE: 0.5 V
MDC IDC MSMT LEADCHNL RA SENSING INTR AMPL: 0.7 mV
MDC IDC MSMT LEADCHNL RV PACING THRESHOLD AMPLITUDE: 1.375 V
MDC IDC SESS DTM: 20150917122324
MDC IDC SET LEADCHNL RV PACING AMPLITUDE: 2.75 V
MDC IDC SET LEADCHNL RV PACING PULSEWIDTH: 0.46 ms
MDC IDC STAT BRADY AP VP PERCENT: 12 %
MDC IDC STAT BRADY AS VP PERCENT: 10 %

## 2014-09-09 ENCOUNTER — Encounter: Payer: Self-pay | Admitting: Cardiology

## 2014-10-30 ENCOUNTER — Encounter: Payer: Self-pay | Admitting: Internal Medicine

## 2014-11-13 ENCOUNTER — Encounter: Payer: Self-pay | Admitting: Internal Medicine

## 2014-11-13 ENCOUNTER — Ambulatory Visit (INDEPENDENT_AMBULATORY_CARE_PROVIDER_SITE_OTHER): Payer: Commercial Indemnity | Admitting: Internal Medicine

## 2014-11-13 VITALS — BP 170/90 | HR 75 | Ht 71.0 in | Wt 197.0 lb

## 2014-11-13 DIAGNOSIS — I442 Atrioventricular block, complete: Secondary | ICD-10-CM

## 2014-11-13 DIAGNOSIS — Z45018 Encounter for adjustment and management of other part of cardiac pacemaker: Secondary | ICD-10-CM

## 2014-11-13 DIAGNOSIS — I441 Atrioventricular block, second degree: Secondary | ICD-10-CM

## 2014-11-13 LAB — MDC_IDC_ENUM_SESS_TYPE_INCLINIC
Battery Impedance: 160 Ohm
Battery Voltage: 2.79 V
Brady Statistic AP VP Percent: 10 %
Brady Statistic AS VS Percent: 24 %
Date Time Interrogation Session: 20151209204828
Lead Channel Impedance Value: 415 Ohm
Lead Channel Impedance Value: 438 Ohm
Lead Channel Pacing Threshold Pulse Width: 0.4 ms
Lead Channel Sensing Intrinsic Amplitude: 1.4 mV
Lead Channel Setting Pacing Amplitude: 2 V
Lead Channel Setting Pacing Amplitude: 2.5 V
MDC IDC MSMT BATTERY REMAINING LONGEVITY: 126 mo
MDC IDC MSMT LEADCHNL RA PACING THRESHOLD AMPLITUDE: 0.5 V
MDC IDC MSMT LEADCHNL RV PACING THRESHOLD AMPLITUDE: 1.25 V
MDC IDC MSMT LEADCHNL RV PACING THRESHOLD PULSEWIDTH: 0.76 ms
MDC IDC MSMT LEADCHNL RV SENSING INTR AMPL: 11.2 mV
MDC IDC SET LEADCHNL RV PACING PULSEWIDTH: 0.76 ms
MDC IDC SET LEADCHNL RV SENSING SENSITIVITY: 5.6 mV
MDC IDC STAT BRADY AP VS PERCENT: 57 %
MDC IDC STAT BRADY AS VP PERCENT: 9 %

## 2014-11-13 NOTE — Progress Notes (Signed)
      Patient Care Team: Christain Sacramento, MD as PCP - General (Family Medicine)   HPI  Antonio Hancock is a 73 y.o. male Seen in followup for pacer implanted Dec 2012 for bifascicular block with intermittent  2:1 av conduction  The patient denies chest pain  nocturnal dyspnea, orthopnea or peripheral edema. There have been no palpitations, lightheadedness or syncope.     His blood pressure has been elevated. There were some issues with renal function associated with prior medications and so he has been restarted  On ARB/HCT his blood pressure still elevated.  He snores, awakens with startle and has signif daytime sleepiness  He continues to smoke;  Significant DOE  Past Medical History  Diagnosis Date  . Hypertension   . Diabetes mellitus     Diagnosed Fall 2012  . Mobitz type 2 second degree AV block     11/11/2011  . Tobacco abuse     1/2 ppd x 55 yrs  . Pacemaker     2012  . Chronotropic incompetence with sinus node dysfunction 11/14/2013    Past Surgical History  Procedure Laterality Date  . Back surgery    . Permanent pacemaker insertion N/A 11/12/2011    Procedure: PERMANENT PACEMAKER INSERTION;  Surgeon: Deboraha Sprang, MD;  Location: [facility] CATH LAB;  Service: Cardiovascular;  Laterality: N/A;    Current Outpatient Prescriptions  Medication Sig Dispense Refill  . ALPRAZolam (XANAX) 0.25 MG tablet as needed.    Marland Kitchen amLODipine (NORVASC) 2.5 MG tablet As directed    . aspirin EC 81 MG tablet Take 81 mg by mouth daily.      Marland Kitchen atorvastatin (LIPITOR) 10 MG tablet Take 10 mg by mouth daily.    . metFORMIN (GLUCOPHAGE-XR) 500 MG 24 hr tablet Take 500 mg by mouth daily with supper.      . Multiple Vitamins-Minerals (MULTIVITAMINS THER. W/MINERALS) TABS Take 1 tablet by mouth daily.      Marland Kitchen olmesartan (BENICAR) 40 MG tablet Take 40 mg by mouth daily.     No current facility-administered medications for this visit.    No Known Allergies  Review of Systems negative except from  HPI and PMH  Physical Exam BP 170/90 mmHg  Pulse 75  Ht 5\' 11"  (1.803 m)  Wt 197 lb (89.359 kg)  BMI 27.49 kg/m2 Well developed and well nourished in no acute distress HENT normal E scleral and icterus clear Neck Supple JVP flat; carotids brisk and full ronchi and wheezes Device pocket well healed; without hematoma or erythema.  There is no tethering Regular rate and rhythm, no murmurs gallops or rub Soft with active bowel sounds No clubbing cyanosis none Edema Alert and oriented, grossly normal motor and sensory function Skin Warm and Dry  ecg  P-synchronous/ AV  pacing   Assessment and  Plan  Sinus node dysfunction   Hypertension  Pacemaker [employer]onic  Kidney insufficiency  Grade 3   His blood pressure remains poorly controlled. We will increase his amlodipine from 2.5--5 mg daily and asked that he follow-up with his PCP in 2-3 weeks or so.  Apparently in the past he was not able to tolerate ECT because of impact on renal function.  our most recent laboratories date from 2012.  In the context of his diabetes control his blood pressure is essential

## 2014-11-13 NOTE — Patient Instructions (Addendum)
Your physician has recommended you make the following change in your medication:  1) INCREASE Amlodipine to 5 mg daily  Follow up with your primary doctor in 2-3 weeks about blood pressure.  Remote monitoring is used to monitor your pacemaker from home. This monitoring reduces the number of office visits required to check your device to one time per year. It allows [place] to keep an eye on the functioning of your device to ensure it is working properly. You are scheduled for a device check from home on 02-12-2015. You may send your transmission at any time that day. If you have a wireless device, the transmission will be sent automatically. After your physician reviews your transmission, you will receive a postcard with your next transmission date.  Your physician recommends that you schedule a follow-up appointment in: 12 months with Dr.Klein

## 2014-11-14 ENCOUNTER — Encounter: Payer: Commercial Indemnity | Admitting: Internal Medicine

## 2014-11-18 ENCOUNTER — Encounter: Payer: Self-pay | Admitting: Internal Medicine

## 2015-02-12 ENCOUNTER — Ambulatory Visit (INDEPENDENT_AMBULATORY_CARE_PROVIDER_SITE_OTHER): Payer: Commercial Indemnity | Admitting: *Deleted

## 2015-02-12 ENCOUNTER — Encounter: Payer: Self-pay | Admitting: Internal Medicine

## 2015-02-12 DIAGNOSIS — I441 Atrioventricular block, second degree: Secondary | ICD-10-CM

## 2015-02-12 NOTE — Progress Notes (Signed)
Remote pacemaker transmission.   

## 2015-02-14 LAB — MDC_IDC_ENUM_SESS_TYPE_REMOTE
Battery Remaining Longevity: 133 mo
Brady Statistic AP VP Percent: 2 %
Brady Statistic AS VS Percent: 37 %
Lead Channel Sensing Intrinsic Amplitude: 0.7 mV
Lead Channel Sensing Intrinsic Amplitude: 8 mV
Lead Channel Setting Pacing Amplitude: 2 V
Lead Channel Setting Pacing Pulse Width: 0.4 ms
MDC IDC MSMT BATTERY IMPEDANCE: 160 Ohm
MDC IDC MSMT BATTERY VOLTAGE: 2.79 V
MDC IDC MSMT LEADCHNL RA IMPEDANCE VALUE: 398 Ohm
MDC IDC MSMT LEADCHNL RA PACING THRESHOLD AMPLITUDE: 0.5 V
MDC IDC MSMT LEADCHNL RA PACING THRESHOLD PULSEWIDTH: 0.4 ms
MDC IDC MSMT LEADCHNL RV IMPEDANCE VALUE: 400 Ohm
MDC IDC MSMT LEADCHNL RV PACING THRESHOLD AMPLITUDE: 1.875 V
MDC IDC MSMT LEADCHNL RV PACING THRESHOLD PULSEWIDTH: 0.4 ms
MDC IDC SESS DTM: 20160309133209
MDC IDC SET LEADCHNL RV PACING AMPLITUDE: 3.75 V
MDC IDC SET LEADCHNL RV SENSING SENSITIVITY: 4 mV
MDC IDC STAT BRADY AP VS PERCENT: 59 %
MDC IDC STAT BRADY AS VP PERCENT: 2 %

## 2015-03-10 ENCOUNTER — Encounter: Payer: Self-pay | Admitting: Cardiology

## 2015-05-14 ENCOUNTER — Ambulatory Visit (INDEPENDENT_AMBULATORY_CARE_PROVIDER_SITE_OTHER): Payer: BLUE CROSS/BLUE SHIELD | Admitting: *Deleted

## 2015-05-14 ENCOUNTER — Telehealth: Payer: Self-pay | Admitting: Cardiology

## 2015-05-14 DIAGNOSIS — I441 Atrioventricular block, second degree: Secondary | ICD-10-CM | POA: Diagnosis not present

## 2015-05-14 NOTE — Telephone Encounter (Signed)
Spoke with pt and reminded pt of remote transmission that is due today. Pt verbalized understanding.   

## 2015-05-14 NOTE — Progress Notes (Signed)
Remote pacemaker transmission.   

## 2015-05-18 LAB — CUP PACEART REMOTE DEVICE CHECK
Battery Voltage: 2.79 V
Brady Statistic AP VP Percent: 1 %
Lead Channel Impedance Value: 429 Ohm
Lead Channel Pacing Threshold Amplitude: 0.5 V
Lead Channel Pacing Threshold Amplitude: 2.25 V
Lead Channel Sensing Intrinsic Amplitude: 0.7 mV
Lead Channel Sensing Intrinsic Amplitude: 8 mV
Lead Channel Setting Pacing Amplitude: 4.5 V
MDC IDC MSMT BATTERY IMPEDANCE: 160 Ohm
MDC IDC MSMT BATTERY REMAINING LONGEVITY: 133 mo
MDC IDC MSMT LEADCHNL RA IMPEDANCE VALUE: 404 Ohm
MDC IDC MSMT LEADCHNL RA PACING THRESHOLD PULSEWIDTH: 0.4 ms
MDC IDC MSMT LEADCHNL RV PACING THRESHOLD PULSEWIDTH: 0.4 ms
MDC IDC SESS DTM: 20160608164026
MDC IDC SET LEADCHNL RA PACING AMPLITUDE: 2 V
MDC IDC SET LEADCHNL RV PACING PULSEWIDTH: 0.4 ms
MDC IDC SET LEADCHNL RV SENSING SENSITIVITY: 4 mV
MDC IDC STAT BRADY AP VS PERCENT: 61 %
MDC IDC STAT BRADY AS VP PERCENT: 1 %
MDC IDC STAT BRADY AS VS PERCENT: 37 %

## 2015-05-27 ENCOUNTER — Other Ambulatory Visit: Payer: Self-pay | Admitting: Family Medicine

## 2015-05-27 DIAGNOSIS — R1031 Right lower quadrant pain: Secondary | ICD-10-CM

## 2015-05-27 DIAGNOSIS — R110 Nausea: Secondary | ICD-10-CM

## 2015-05-27 DIAGNOSIS — R634 Abnormal weight loss: Secondary | ICD-10-CM

## 2015-05-28 ENCOUNTER — Encounter: Payer: Self-pay | Admitting: Cardiology

## 2015-05-29 ENCOUNTER — Ambulatory Visit
Admission: RE | Admit: 2015-05-29 | Discharge: 2015-05-29 | Disposition: A | Discharge status: Home / Self Care | Payer: BLUE CROSS/BLUE SHIELD | Source: Ambulatory Visit | Attending: Family Medicine | Admitting: Family Medicine

## 2015-05-29 DIAGNOSIS — R634 Abnormal weight loss: Secondary | ICD-10-CM

## 2015-05-29 DIAGNOSIS — R1031 Right lower quadrant pain: Secondary | ICD-10-CM

## 2015-05-29 DIAGNOSIS — R110 Nausea: Secondary | ICD-10-CM

## 2015-05-29 MED ORDER — IOPAMIDOL (ISOVUE-300) INJECTION 61%
100.0000 mL | Freq: Once | INTRAVENOUS | Status: AC | PRN
  Administered 2015-05-29: 100 mL via INTRAVENOUS

## 2015-06-02 ENCOUNTER — Encounter: Payer: Self-pay | Admitting: Internal Medicine

## 2015-06-03 ENCOUNTER — Other Ambulatory Visit: Payer: Self-pay | Admitting: Family Medicine

## 2015-06-03 DIAGNOSIS — R9389 Abnormal findings on diagnostic imaging of other specified body structures: Secondary | ICD-10-CM

## 2015-06-10 ENCOUNTER — Other Ambulatory Visit: Payer: BLUE CROSS/BLUE SHIELD

## 2015-06-11 ENCOUNTER — Ambulatory Visit
Admission: RE | Admit: 2015-06-11 | Discharge: 2015-06-11 | Disposition: A | Discharge status: Home / Self Care | Payer: BLUE CROSS/BLUE SHIELD | Source: Ambulatory Visit | Attending: Family Medicine | Admitting: Family Medicine

## 2015-06-11 DIAGNOSIS — R9389 Abnormal findings on diagnostic imaging of other specified body structures: Secondary | ICD-10-CM

## 2015-06-11 MED ORDER — IOPAMIDOL (ISOVUE-370) INJECTION 76%
80.0000 mL | Freq: Once | INTRAVENOUS | Status: AC | PRN
  Administered 2015-06-11: 80 mL via INTRAVENOUS

## 2015-07-24 ENCOUNTER — Other Ambulatory Visit: Payer: Self-pay | Admitting: Urology

## 2015-08-04 ENCOUNTER — Other Ambulatory Visit: Payer: Self-pay | Admitting: Urology

## 2015-08-04 DIAGNOSIS — N2889 Other specified disorders of kidney and ureter: Secondary | ICD-10-CM

## 2015-08-06 ENCOUNTER — Ambulatory Visit
Admission: RE | Admit: 2015-08-06 | Discharge: 2015-08-06 | Disposition: A | Discharge status: Home / Self Care | Payer: BLUE CROSS/BLUE SHIELD | Source: Ambulatory Visit | Attending: Urology | Admitting: Urology

## 2015-08-06 DIAGNOSIS — N2889 Other specified disorders of kidney and ureter: Secondary | ICD-10-CM

## 2015-08-06 HISTORY — DX: Hyperlipidemia, unspecified: E78.5

## 2015-08-06 NOTE — Consult Note (Signed)
Chief Complaint: Patient was seen in consultation today for  Chief Complaint  Patient presents with  . Advice Only    Consult for Right Renal Mass   at the request of Madison L  Referring Physician(s): McKenzie,Patrick L  History of Present Illness: Antonio Hancock is a 74 y.o. male who presents today as a referral from Dr. Alyson Ingles to discuss treatment options for new incidental finding of a right renal mass with high suspicion to be renal call carcinoma by imaging. The patient underwent CT imaging 06/11/15 after complaints of right flank pain radiating anteriorly with nausea and weight loss of 20 lbs since 05/2015. He describes his right flank/abdominal pain as a "soreness" that is constant in nature with intermittent worsening flare-ups. This pain started acutely while walking [place] the beach 05/20/15. He denies any blood in his stool or urine. He denies any dysuria, hesitancy, urgency or urinary frequency. He does admit to a history of UTI over 1 year ago that was treated and has not had any problems since. He has recently underwent a colonoscopy approximately 1 month ago that per patient was without any significant findings.   He does admit to current everyday tobacco use of 1/2 PPD x 55 years, he denies any PMHx including COPD and does not follow with a pulmonologist. He denies any history of Atrial fibrillation, CAD, or MI. He does have a history of Mobitz type 2 second degree AV block in 2012 found by PCP for which he has a pacemaker in place now. He follows with Dr. Caryl Comes (EP) and last office visit was December 2015. He also has significant PMHx of DM type 2 and HTN that are well controlled with medications per patient. He denies any previous complications with anesthesia and the last time he underwent anesthesia was in 1995 with back surgery.   Past Medical History  Diagnosis Date  . Hypertension   . Diabetes mellitus     Diagnosed Fall 2012  . Mobitz type 2 second degree  AV block     11/11/2011  . Tobacco abuse     1/2 ppd x 55 yrs  . Pacemaker     2012  . Chronotropic incompetence with sinus node dysfunction 11/14/2013  . Hyperlipidemia     Past Surgical History  Procedure Laterality Date  . Back surgery    . Permanent pacemaker insertion N/A 11/12/2011    Procedure: PERMANENT PACEMAKER INSERTION;  Surgeon: Deboraha Sprang, MD;  Location: [facility] CATH LAB;  Service: Cardiovascular;  Laterality: N/A;  Colonoscopy 06/2015  Allergies: Review of patient's allergies indicates no known allergies.  Medications: Prior to Admission medications   Medication Sig Start Date End Date Taking? Authorizing Provider  ALPRAZolam Duanne Moron) 0.25 MG tablet as needed. 11/04/13  Yes Historical Provider, MD  amLODipine (NORVASC) 2.5 MG tablet Take 5 mg by mouth daily. 11/03/13  Yes Historical Provider, MD  aspirin EC 81 MG tablet Take 81 mg by mouth daily.     Yes Historical Provider, MD  atorvastatin (LIPITOR) 10 MG tablet Take 10 mg by mouth daily.   Yes Historical Provider, MD  metFORMIN (GLUCOPHAGE-XR) 500 MG 24 hr tablet Take 500 mg by mouth daily with supper.     Yes Historical Provider, MD  Multiple Vitamins-Minerals (MULTIVITAMINS THER. W/MINERALS) TABS Take 1 tablet by mouth daily.     Yes Historical Provider, MD  olmesartan (BENICAR) 40 MG tablet Take 40 mg by mouth daily.   Yes Historical Provider,  MD  polyethylene glycol (MIRALAX / GLYCOLAX) packet Take 17 g by mouth daily.   Yes Historical Provider, MD     Family History  Problem Relation Age of Onset  . Brain cancer Mother     deceased 2  . Prostate cancer Father     deceased 67  . Deep vein thrombosis Father   . Hypertension Father   . Diabetes Father     Social History   Social History  . Marital Status: Married    Spouse Name: N/A  . Number of Children: N/A  . Years of Education: N/A   Social History Main Topics  . Smoking status: Current Every Day Smoker -- 0.50 packs/day for 55 years    Types:  Cigarettes  . Smokeless tobacco: None  . Alcohol Use: No  . Drug Use: No  . Sexual Activity: Not Asked   Other Topics Concern  . None   Social History Narrative   Gambles on the Internet    ECOG Status: 0 - Asymptomatic  Review of Systems: A 12 point ROS discussed and pertinent positives are indicated in the HPI above.  All other systems are negative.  Review of Systems  Constitutional: Positive for appetite change. Negative for fever and diaphoresis.  Respiratory: Negative for cough, chest tightness and wheezing.   Cardiovascular: Negative for chest pain, palpitations and leg swelling.  Gastrointestinal: Positive for nausea and abdominal pain.  Genitourinary: Positive for flank pain. Negative for dysuria, hematuria and difficulty urinating.  Neurological: Negative for dizziness and light-headedness.    Vital Signs: BP 137/76 mmHg  Pulse 80  Temp(Src) 97.8 F (36.6 C) (Oral)  Resp 14  Ht 5' 11.75" (1.822 m)  Wt 178 lb (80.74 kg)  BMI 24.32 kg/m2  SpO2 100%  Physical Exam  Constitutional: He is oriented to person, place, and time. No distress.  HENT:  Head: Normocephalic and atraumatic.  Cardiovascular: Normal rate and regular rhythm.  Exam reveals no gallop and no friction rub.   No murmur heard. Pulmonary/Chest: Effort normal. No respiratory distress. He has no wheezes. He has no rales.  Diminished BS throughout  Abdominal: Soft. Bowel sounds are normal. He exhibits no distension. There is no tenderness.  No CVA tenderness b/l  Musculoskeletal: He exhibits no edema.  Neurological: He is alert and oriented to person, place, and time.  Skin: He is not diaphoretic.  Left sided PM intact  Psychiatric: He has a normal mood and affect. His behavior is normal. Thought content normal.    Mallampati Score:  MD Evaluation Airway: WNL Heart: WNL Abdomen: WNL Chest/ Lungs: WNL ASA  Classification: 3 Mallampati/Airway Score: Two  Imaging: CT Abdomen/Pelvis  06/11/15: Lesion arising from the upper pole of right kidney measures 3 x 2.7 cm. There is equivocal enhancement associated with the hyperdense lesion arising from the upper pole of right kidney. The degree of increased Hounsfield units between the pre and post-contrast images may reflect pseudoenhancement. Consider further assessment of this lesion with ultrasound to evaluate for solid internal components. The presence of solid internal components or thickened septation would favor a renal cell carcinoma.  Labs: Recent UA in Urology office per patient without any signs on UTI.  Assessment: Right flank/abdominal pain since 05/2015- no known injury   Weight loss 20 lbs since 05/2015 secondary to nausea Incidental 3 x 2.7 cm right renal mass, CT 06/11/2015  Seen by Dr. Alyson Ingles at [facility] 07/24/15 and referred to IR clinic to discuss possible Cryoablation  Risks, benefits and options of watching this area with serial imaging versus partial/full nephrectomy versus Cryoablation were discussed with the patient and his family today, they would like to proceed with right renal mass Cryoablation with possible biopsy at time of procedure.   Risks and Benefits of Cryoablation were discussed with the patient including, but not limited to failure to treat entire lesion, bleeding, infection, damage to adjacent structures, decrease in renal function or post procedural neuropathy.  All of the patient's questions were answered, patient is agreeable to proceed.  We will arrange to have the patient follow-up with his Electrophysiologist Dr. Caryl Comes for pre-operative anesthesia clearance and obtain insurance approval prior to scheduling.     SignedHedy Jacob 08/06/2015, 9:16 AM

## 2015-08-14 ENCOUNTER — Ambulatory Visit: Payer: BLUE CROSS/BLUE SHIELD | Admitting: *Deleted

## 2015-08-14 DIAGNOSIS — I442 Atrioventricular block, complete: Secondary | ICD-10-CM

## 2015-08-14 NOTE — Progress Notes (Signed)
Remote pacemaker transmission.   

## 2015-08-20 ENCOUNTER — Ambulatory Visit (INDEPENDENT_AMBULATORY_CARE_PROVIDER_SITE_OTHER): Payer: BLUE CROSS/BLUE SHIELD | Admitting: *Deleted

## 2015-08-20 ENCOUNTER — Encounter: Payer: Self-pay | Admitting: Physician Assistant

## 2015-08-20 ENCOUNTER — Encounter: Payer: Self-pay | Admitting: Internal Medicine

## 2015-08-20 ENCOUNTER — Ambulatory Visit (INDEPENDENT_AMBULATORY_CARE_PROVIDER_SITE_OTHER): Payer: BLUE CROSS/BLUE SHIELD | Admitting: Physician Assistant

## 2015-08-20 VITALS — BP 140/50 | HR 67 | Ht 71.75 in | Wt 179.0 lb

## 2015-08-20 DIAGNOSIS — I4891 Unspecified atrial fibrillation: Secondary | ICD-10-CM | POA: Diagnosis not present

## 2015-08-20 DIAGNOSIS — I442 Atrioventricular block, complete: Secondary | ICD-10-CM | POA: Diagnosis not present

## 2015-08-20 DIAGNOSIS — Z950 Presence of cardiac pacemaker: Secondary | ICD-10-CM | POA: Diagnosis not present

## 2015-08-20 LAB — CUP PACEART INCLINIC DEVICE CHECK
Battery Impedance: 184 Ohm
Battery Voltage: 2.79 V
Brady Statistic AP VP Percent: 1 %
Brady Statistic AP VS Percent: 59 %
Brady Statistic AS VP Percent: 1 %
Brady Statistic AS VS Percent: 39 %
Lead Channel Impedance Value: 443 Ohm
Lead Channel Pacing Threshold Amplitude: 0.5 V
Lead Channel Pacing Threshold Pulse Width: 0.4 ms
Lead Channel Sensing Intrinsic Amplitude: 0.5 mV
Lead Channel Sensing Intrinsic Amplitude: 11.2 mV
Lead Channel Setting Pacing Amplitude: 2 V
Lead Channel Setting Pacing Pulse Width: 0.4 ms
Lead Channel Setting Sensing Sensitivity: 4 mV
MDC IDC MSMT BATTERY REMAINING LONGEVITY: 134 mo
MDC IDC MSMT LEADCHNL RA IMPEDANCE VALUE: 409 Ohm
MDC IDC MSMT LEADCHNL RV PACING THRESHOLD AMPLITUDE: 1.5 V
MDC IDC MSMT LEADCHNL RV PACING THRESHOLD PULSEWIDTH: 0.4 ms
MDC IDC SESS DTM: 20160914175152
MDC IDC SET LEADCHNL RV PACING AMPLITUDE: 2.75 V

## 2015-08-20 MED ORDER — APIXABAN 5 MG PO TABS: 5.0000 mg | ORAL_TABLET | Freq: Two times a day (BID) | ORAL | Status: DC

## 2015-08-20 NOTE — Progress Notes (Signed)
Add-on pacemaker check in clinic. Normal device function. Thresholds, sensing, impedances consistent with previous measurements, slight elevation in RV threshold between 08/2014 and 07/2015. Device programmed to maximize longevity. 7 AMS episodes, 1 AHR episode, EGM shows AF, duration ~4hr 71min, peak A >400bpm, peak V 171bpm, started on Eliquis today. 1 high ventricular rate noted, ?NSVT, 7 beats, markers only, rate 197bpm. Device programmed at appropriate safety margins. Histogram distribution appropriate for patient activity level. Device programmed to optimize intrinsic conduction. Estimated longevity 11 years. Patient education completed. ROV with SK on 11/21/2015 at 11:15am.

## 2015-08-20 NOTE — Patient Instructions (Addendum)
Medication Instructions:  Your physician has recommended you make the following change in your medication:  1.  STOP the Aspirin. 2.  START Eliquis 5 mg take 1 tablet by mouth 2 times a day   Labwork: None ordered  Testing/Procedures: Your physician has requested that you have an echocardiogram. Echocardiography is a painless test that uses sound waves to create images of your heart. It provides your doctor with information about the size and shape of your heart and how well your heart's chambers and valves are working. This procedure takes approximately one hour. There are no restrictions for this procedure.  Your physician also wants you to get another Chest X-Ray.  You can go to [facility] [place], [facility], [phone], anytime Monday - Friday 8:30-4:30 without an appointment.  Follow-Up: Your physician recommends that you can just continue to follow-up with Dr. Caryl Comes in December.  If there is anything abnormal with the Echocardiogram, we will call you.

## 2015-08-20 NOTE — Progress Notes (Signed)
Cardiology Office Note   Date:  08/20/2015   ID:  Antonio Hancock, DOB 09-24-1941, MRN [id number]  PCP:  Woody Seller, MD  Cardiologist:  Dr Suzie Portela, PA-C   Chief Complaint  Patient presents with  . Weight Loss    UNEXPLAINED  . Pre-op Exam    History of Present Illness: Antonio Hancock is a 74 y.o. male with a history of second-degree A-V block and chronotropic incompetence, Medtronic pacemaker inserted in 2012. His EF at that time was normal. He has never had an ischemic evaluation. He has a history of tobacco use, hyperlipidemia and diabetes.  He had flank pain, nausea and weight loss in June 2016. A CAT scan at that time showed a possible right renal mass. He was evaluated by interventional radiology and the plan is for percutaneous cryoablation and biopsy.  Antonio Hancock presents for preoperative cardiac evaluation.  [name] never gets chest pain. He coughs upon occasion but the cough is generally nonproductive, unless he has a great deal of sinus drainage. He walks a mile a day and it takes him between 20 and 22 minutes.   He does not feel that he has problems walking up hills, and he does not feel that he has slowed down on this walk in the last several months. He rarely climbs over 5 steps, but does not have any problem with this. He denies edema, orthopnea, or PND.  He never has palpitations, presyncope or syncope. He does feel he has been under a great deal of stress for the last 3 months, ever since he had the first abnormal scan.  Past Medical History  Diagnosis Date  . Hypertension   . Diabetes mellitus     Diagnosed Fall 2012  . Mobitz type 2 second degree AV block     11/11/2011  . Tobacco abuse     1/2 ppd x 55 yrs  . Pacemaker 2012    Medtronic Adapta L pulse generator, serial number [id number]  . Chronotropic incompetence with sinus node dysfunction 2012    s/p MDT PPM  . Hyperlipidemia   . HOH (hard of hearing)     Past  Surgical History  Procedure Laterality Date  . Back surgery    . Permanent pacemaker insertion N/A 11/12/2011    Procedure: PERMANENT PACEMAKER INSERTION;  Surgeon: Deboraha Sprang, MD;  Location: [facility] CATH LAB;  Service: Cardiovascular;  Laterality: Left; Medtronic Adapta L pulse generator, serial number [id number]      Current Outpatient Prescriptions  Medication Sig Dispense Refill  . ALPRAZolam (XANAX) 0.25 MG tablet as needed for anxiety.     Marland Kitchen amLODipine (NORVASC) 5 MG tablet TAKE 1 TABLET DAILY AS DIRECTED.  3  . aspirin EC 81 MG tablet Take 81 mg by mouth daily.      Marland Kitchen atorvastatin (LIPITOR) 10 MG tablet Take 10 mg by mouth daily.    . metFORMIN (GLUCOPHAGE-XR) 500 MG 24 hr tablet Take 500 mg by mouth daily with supper.      . Multiple Vitamins-Minerals (MULTIVITAMINS THER. W/MINERALS) TABS Take 1 tablet by mouth daily.      Marland Kitchen olmesartan (BENICAR) 40 MG tablet Take 40 mg by mouth daily.     No current facility-administered medications for this visit.    Allergies:   Review of patient's allergies indicates no known allergies.    Social History:  The patient  reports that he has been smoking Cigarettes.  He has a 55 pack-year smoking  history. He does not have any smokeless tobacco history on file. He reports that he does not drink alcohol or use illicit drugs.   Family History:  The patient's family history includes Brain cancer in his mother; Cancer in his father and mother; Deep vein thrombosis in his father; Diabetes in his father; Hypertension in his brother, father, and sister; Prostate cancer in his father. There is no history of Heart attack.    ROS:  Please see the history of present illness. All other systems are reviewed and negative.   PHYSICAL EXAM: VS:  BP 140/50 mmHg  Pulse 67  Ht 5' 11.75" (1.822 m)  Wt 179 lb (81.194 kg)  BMI 24.46 kg/m2 , BMI Body mass index is 24.46 kg/(m^2). GEN: Well nourished, well developed, in no acute distress HEENT: normal for age, very  poor dentition Neck: no JVD, carotid bruits, or masses Cardiac: RRR; no murmurs, rubs, or gallops,no edema; distal pulses intact in all 4 extremities  Respiratory:  Dry rales bilaterally, normal work of breathing GI: soft, nontender, nondistended, + BS MS: no deformity or atrophy Skin: warm and dry, no rash Neuro:  Strength and sensation are intact Psych: euthymic mood, full affect   EKG:  EKG is ordered today. The ekg ordered today demonstrates sinus rhythm with right bundle branch block and left anterior fascicular block, unchanged   Recent Labs: Outpatient labs reviewed in no acute abnormalities noted on the BMET and CBC    Lipid Panel    Component Value Date/Time   CHOL 186 11/12/2011 0541   TRIG 157* 11/12/2011 0541   HDL 25* 11/12/2011 0541   CHOLHDL 7.4 11/12/2011 0541   VLDL 31 11/12/2011 0541   LDLCALC 130* 11/12/2011 0541     Wt Readings from Last 3 Encounters:  08/20/15 179 lb (81.194 kg)  08/06/15 178 lb (80.74 kg)  11/13/14 197 lb (89.359 kg)     Other studies Reviewed: Additional studies/ records that were reviewed today include: Records from [facility] imaging, previous office visits, op notes and echocardiogram.  ASSESSMENT AND PLAN:  1.  Renal mass: He is for cryoablation and biopsy in the next 4-6 weeks. Per Dr. Francena Hanly with [facility]  2. Preoperative evaluation: Mr. Mullendore has no ongoing ischemic symptoms. He has never had an ischemic evaluation. He has not had his EF assessed in 4 years. Because of his smoking history and possible renal cell carcinoma, we will also check a chest x-ray. We will update his echocardiogram.  3. Chronotropic incompetence with Medtronic Pacemaker: On device interrogation, he was Vpacing 2%, A pacing 60.2%, 7 beats of nonsustained VT on 07/28 and had an episode of afib for 4 hours on 06/19.   4. Anticoagulation: This patients CHA2DS2-VASc Score and unadjusted Ischemic Stroke Rate (% per year) is equal to 3.2 % stroke  rate/year from a score of 3 Above score calculated as 1 point each if present [CHF, HTN, DM, Vascular=MI/PAD/Aortic Plaque, Age if 65-74, or Male], 2 points each if present [Age > 75, or Stroke/TIA/TE]. He will be started on Eliquis and we will stop his aspirin  Current medicines are reviewed at length with the patient today.  The patient does not have concerns regarding medicines.  The following changes have been made:  And Eliquis and stop aspirin  Labs/ tests ordered today include:   Orders Placed This Encounter  Procedures  . DG Chest 2 View  . EKG 12-Lead  . Echocardiogram   Disposition:   FU with Dr. Caryl Comes  as scheduled  Augusto Garbe  08/20/2015 4:11 PM    [facility] [place], [place], [place]  [address] Phone: [phone]; Fax: [phone]

## 2015-08-25 ENCOUNTER — Ambulatory Visit: Payer: BLUE CROSS/BLUE SHIELD | Admitting: Interventional Cardiology

## 2015-08-26 ENCOUNTER — Other Ambulatory Visit: Payer: Self-pay

## 2015-08-26 ENCOUNTER — Ambulatory Visit (HOSPITAL_COMMUNITY): Payer: BLUE CROSS/BLUE SHIELD | Attending: Physician Assistant

## 2015-08-26 ENCOUNTER — Ambulatory Visit
Admission: RE | Admit: 2015-08-26 | Discharge: 2015-08-26 | Disposition: A | Discharge status: Home / Self Care | Payer: BLUE CROSS/BLUE SHIELD | Source: Ambulatory Visit | Attending: Physician Assistant | Admitting: Physician Assistant

## 2015-08-26 DIAGNOSIS — E785 Hyperlipidemia, unspecified: Secondary | ICD-10-CM | POA: Diagnosis not present

## 2015-08-26 DIAGNOSIS — E119 Type 2 diabetes mellitus without complications: Secondary | ICD-10-CM | POA: Insufficient documentation

## 2015-08-26 DIAGNOSIS — Z950 Presence of cardiac pacemaker: Secondary | ICD-10-CM | POA: Diagnosis present

## 2015-08-26 DIAGNOSIS — I517 Cardiomegaly: Secondary | ICD-10-CM | POA: Insufficient documentation

## 2015-08-26 DIAGNOSIS — I10 Essential (primary) hypertension: Secondary | ICD-10-CM | POA: Diagnosis not present

## 2015-09-04 ENCOUNTER — Other Ambulatory Visit: Payer: Self-pay | Admitting: Gastroenterology

## 2015-09-04 DIAGNOSIS — R110 Nausea: Secondary | ICD-10-CM

## 2015-09-15 ENCOUNTER — Other Ambulatory Visit: Payer: Self-pay | Admitting: Interventional Radiology

## 2015-09-15 DIAGNOSIS — N2889 Other specified disorders of kidney and ureter: Secondary | ICD-10-CM

## 2015-09-18 ENCOUNTER — Ambulatory Visit (HOSPITAL_COMMUNITY)
Admission: RE | Admit: 2015-09-18 | Discharge: 2015-09-18 | Disposition: A | Discharge status: Home / Self Care | Payer: BLUE CROSS/BLUE SHIELD | Source: Ambulatory Visit | Attending: Gastroenterology | Admitting: Gastroenterology

## 2015-09-18 DIAGNOSIS — R109 Unspecified abdominal pain: Secondary | ICD-10-CM | POA: Insufficient documentation

## 2015-09-18 DIAGNOSIS — R110 Nausea: Secondary | ICD-10-CM | POA: Diagnosis present

## 2015-09-18 MED ORDER — TECHNETIUM TC 99M SULFUR COLLOID
2.1000 | Freq: Once | INTRAVENOUS | Status: DC | PRN
  Administered 2015-09-18: 2.1 via ORAL
  Filled 2015-09-18: qty 2.1

## 2015-10-14 ENCOUNTER — Other Ambulatory Visit: Payer: Self-pay | Admitting: Radiology

## 2015-10-15 NOTE — Patient Instructions (Signed)
Antonio Hancock  10/15/2015   Your procedure is scheduled on: 10/23/2015    Report to [facility] Main  Entrance take [place]  elevators to 3rd floor to  [place] at    [place] AM.  Call this number if you have problems the morning of surgery [phone]   Remember: ONLY 1 PERSON MAY GO WITH YOU TO SHORT STAY TO GET  READY MORNING OF [place].  Do not eat food or drink liquids :After Midnight.     Take these medicines the morning of surgery with A SIP OF WATER:  Amlodipine ( Norvasc), Protonix                                 You may not have any metal on your body including hair pins and              piercings  Do not wear jewelry, make-up, lotions, powders or perfumes, deodorant                        Men may shave face and neck.   Do not bring valuables to the hospital. [place].  Contacts, dentures or bridgework may not be worn into surgery.      Patients discharged the day of surgery will not be allowed to drive home.  Name and phone number of your driver:  Special Instructions: coughing and deep breathing exercises, leg exercises               Please read over the following fact sheets you were given: _____________________________________________________________________             [facility] - Preparing for Surgery Before surgery, you can play an important role.  Because skin is not sterile, your skin needs to be as free of germs as possible.  You can reduce the number of germs on your skin by washing with CHG (chlorahexidine gluconate) soap before surgery.  CHG is an antiseptic cleaner which kills germs and bonds with the skin to continue killing germs even after washing. Please DO NOT use if you have an allergy to CHG or antibacterial soaps.  If your skin becomes reddened/irritated stop using the CHG and inform your nurse when you arrive at Short Stay. Do not shave (including legs and  underarms) for at least 48 hours prior to the first CHG shower.  You may shave your face/neck. Please follow these instructions carefully:  1.  Shower with CHG Soap the night before surgery and the  morning of Surgery.  2.  If you choose to wash your hair, wash your hair first as usual with your  normal  shampoo.  3.  After you shampoo, rinse your hair and body thoroughly to remove the  shampoo.                           4.  Use CHG as you would any other liquid soap.  You can apply chg directly  to the skin and wash                       Gently with a scrungie  or clean washcloth.  5.  Apply the CHG Soap to your body ONLY FROM THE NECK DOWN.   Do not use on face/ open                           Wound or open sores. Avoid contact with eyes, ears mouth and genitals (private parts).                       Wash face,  Genitals (private parts) with your normal soap.             6.  Wash thoroughly, paying special attention to the area where your surgery  will be performed.  7.  Thoroughly rinse your body with warm water from the neck down.  8.  DO NOT shower/wash with your normal soap after using and rinsing off  the CHG Soap.                9.  Pat yourself dry with a clean towel.            10.  Wear clean pajamas.            11.  Place clean sheets on your bed the night of your first shower and do not  sleep with pets. Day of Surgery : Do not apply any lotions/deodorants the morning of surgery.  Please wear clean clothes to the hospital/surgery center.  FAILURE TO FOLLOW THESE INSTRUCTIONS MAY RESULT IN THE CANCELLATION OF YOUR SURGERY PATIENT SIGNATURE_________________________________  NURSE SIGNATURE__________________________________  ________________________________________________________________________  WHAT IS A BLOOD TRANSFUSION? Blood Transfusion Information  A transfusion is the replacement of blood or some of its parts. Blood is made up of multiple cells which provide different  functions.  Red blood cells carry oxygen and are used for blood loss replacement.  White blood cells fight against infection.  Platelets control bleeding.  Plasma helps clot blood.  Other blood products are available for specialized needs, such as hemophilia or other clotting disorders. BEFORE THE TRANSFUSION  Who gives blood for transfusions?   Healthy volunteers who are fully evaluated to make sure their blood is safe. This is blood bank blood. Transfusion therapy is the safest it has ever been in the practice of medicine. Before blood is taken from a donor, a complete history is taken to make sure that person has no history of diseases nor engages in risky social behavior (examples are intravenous drug use or sexual activity with multiple partners). The donor's travel history is screened to minimize risk of transmitting infections, such as malaria. The donated blood is tested for signs of infectious diseases, such as HIV and hepatitis. The blood is then tested to be sure it is compatible with you in order to minimize the chance of a transfusion reaction. If you or a relative donates blood, this is often done in anticipation of surgery and is not appropriate for emergency situations. It takes many days to process the donated blood. RISKS AND COMPLICATIONS Although transfusion therapy is very safe and saves many lives, the main dangers of transfusion include:   Getting an infectious disease.  Developing a transfusion reaction. This is an allergic reaction to something in the blood you were given. Every precaution is taken to prevent this. The decision to have a blood transfusion has been considered carefully by your caregiver before blood is given. Blood is not given unless the benefits outweigh the risks. AFTER THE  TRANSFUSION  Right after receiving a blood transfusion, you will usually feel much better and more energetic. This is especially true if your red blood cells have gotten low  (anemic). The transfusion raises the level of the red blood cells which carry oxygen, and this usually causes an energy increase.  The nurse administering the transfusion will monitor you carefully for complications. HOME CARE INSTRUCTIONS  No special instructions are needed after a transfusion. You may find your energy is better. Speak with your caregiver about any limitations on activity for underlying diseases you may have. SEEK MEDICAL CARE IF:   Your condition is not improving after your transfusion.  You develop redness or irritation at the intravenous (IV) site. SEEK IMMEDIATE MEDICAL CARE IF:  Any of the following symptoms occur over the next 12 hours:  Shaking chills.  You have a temperature by mouth above 102 F (38.9 C), not controlled by medicine.  Chest, back, or muscle pain.  People around you feel you are not acting correctly or are confused.  Shortness of breath or difficulty breathing.  Dizziness and fainting.  You get a rash or develop hives.  You have a decrease in urine output.  Your urine turns a dark color or changes to pink, red, or brown. Any of the following symptoms occur over the next 10 days:  You have a temperature by mouth above 102 F (38.9 C), not controlled by medicine.  Shortness of breath.  Weakness after normal activity.  The white part of the eye turns yellow (jaundice).  You have a decrease in the amount of urine or are urinating less often.  Your urine turns a dark color or changes to pink, red, or brown. Document Released: 11/19/2000 Document Revised: 02/14/2012 Document Reviewed: 07/08/2008 [facility] Patient Information 2014 [place], [place].  _______________________________________________________________________

## 2015-10-16 ENCOUNTER — Encounter (HOSPITAL_COMMUNITY): Payer: Self-pay

## 2015-10-16 ENCOUNTER — Encounter (HOSPITAL_COMMUNITY)
Admission: RE | Admit: 2015-10-16 | Discharge: 2015-10-16 | Disposition: A | Discharge status: Home / Self Care | Payer: BLUE CROSS/BLUE SHIELD | Source: Ambulatory Visit | Attending: Interventional Radiology | Admitting: Interventional Radiology

## 2015-10-16 DIAGNOSIS — Z01818 Encounter for other preprocedural examination: Secondary | ICD-10-CM | POA: Diagnosis not present

## 2015-10-16 DIAGNOSIS — K869 Disease of pancreas, unspecified: Secondary | ICD-10-CM | POA: Diagnosis not present

## 2015-10-16 DIAGNOSIS — K831 Obstruction of bile duct: Secondary | ICD-10-CM | POA: Insufficient documentation

## 2015-10-16 HISTORY — DX: Anxiety disorder, unspecified: F41.9

## 2015-10-16 LAB — CBC WITH DIFFERENTIAL/PLATELET
Basophils Absolute: 0 10*3/uL (ref 0.0–0.1)
Basophils Relative: 0 %
Eosinophils Absolute: 0.2 10*3/uL (ref 0.0–0.7)
Eosinophils Relative: 5 %
HEMATOCRIT: 34.8 % — AB (ref 39.0–52.0)
Hemoglobin: 12.4 g/dL — ABNORMAL LOW (ref 13.0–17.0)
LYMPHS ABS: 0.5 10*3/uL — AB (ref 0.7–4.0)
LYMPHS PCT: 13 %
MCH: 31 pg (ref 26.0–34.0)
MCHC: 35.6 g/dL (ref 30.0–36.0)
MCV: 87 fL (ref 78.0–100.0)
MONO ABS: 0.7 10*3/uL (ref 0.1–1.0)
MONOS PCT: 18 %
NEUTROS ABS: 2.4 10*3/uL (ref 1.7–7.7)
Neutrophils Relative %: 64 %
Platelets: 276 10*3/uL (ref 150–400)
RBC: 4 MIL/uL — ABNORMAL LOW (ref 4.22–5.81)
RDW: 15.5 % (ref 11.5–15.5)
WBC: 3.8 10*3/uL — ABNORMAL LOW (ref 4.0–10.5)

## 2015-10-16 LAB — BASIC METABOLIC PANEL
ANION GAP: 10 (ref 5–15)
BUN: 11 mg/dL (ref 6–20)
CO2: 23 mmol/L (ref 22–32)
Calcium: 9.9 mg/dL (ref 8.9–10.3)
Chloride: 94 mmol/L — ABNORMAL LOW (ref 101–111)
Creatinine, Ser: 0.83 mg/dL (ref 0.61–1.24)
GFR calc Af Amer: >60 mL/min (ref >60)
GFR calc non Af Amer: >60 mL/min (ref >60)
GLUCOSE: 127 mg/dL — AB (ref 65–99)
Potassium: 5 mmol/L (ref 3.5–5.1)
Sodium: 127 mmol/L — ABNORMAL LOW (ref 135–145)

## 2015-10-16 LAB — APTT: APTT: 34 s (ref 24–37)

## 2015-10-16 LAB — PROTIME-INR
INR: 1.42 (ref 0.00–1.49)
Prothrombin Time: 17.4 seconds — ABNORMAL HIGH (ref 11.6–15.2)

## 2015-10-16 LAB — ABO/RH: ABO/RH(D): A POS

## 2015-10-16 NOTE — Progress Notes (Addendum)
BMP results done 10/16/15 faxed via EPIC to Dr Sandi Mariscal, [facility].  PT/INR results done 10/16/2015 faxed via EPIC to Dr Sandi Mariscal, [facility].

## 2015-10-16 NOTE — Progress Notes (Signed)
08/20/15- EKG-EPIC  Last device check- 08/20/15- EPIC  08/20/15- LOV with PA in Cardiology - EPIC  11/13/14- LOV with DR Jolyn Nap in EPIC  08/26/15-ECHO-EPIC  08/26/2015- CXR-EPIC

## 2015-10-16 NOTE — Progress Notes (Signed)
Your patient has scored a 5 on the STOP BANG Assessment Tool for Obstructive Sleep Apnea.  This patient is considered high risk for Obstructive Sleep Apnea using this tool.  This score was completed at preop appointment fur surgery on 10/16/15.  Thank You.

## 2015-10-21 ENCOUNTER — Other Ambulatory Visit: Payer: Self-pay | Admitting: Radiology

## 2015-10-22 ENCOUNTER — Encounter (HOSPITAL_COMMUNITY): Payer: Self-pay | Admitting: Certified Registered Nurse Anesthetist

## 2015-10-22 ENCOUNTER — Other Ambulatory Visit: Payer: Self-pay | Admitting: Radiology

## 2015-10-23 ENCOUNTER — Ambulatory Visit (HOSPITAL_COMMUNITY)
Admission: RE | Admit: 2015-10-23 | Discharge: 2015-10-23 | Disposition: A | Discharge status: Home / Self Care | Payer: BLUE CROSS/BLUE SHIELD | Source: Ambulatory Visit | Attending: Interventional Radiology | Admitting: Interventional Radiology

## 2015-10-23 ENCOUNTER — Encounter (HOSPITAL_COMMUNITY): Payer: Self-pay | Admitting: *Deleted

## 2015-10-23 ENCOUNTER — Encounter (HOSPITAL_COMMUNITY): Payer: Self-pay

## 2015-10-23 ENCOUNTER — Encounter (HOSPITAL_COMMUNITY)
Admission: AD | Disposition: A | Discharge status: Home / Self Care | Payer: Self-pay | Source: Ambulatory Visit | Attending: Internal Medicine

## 2015-10-23 ENCOUNTER — Ambulatory Visit (HOSPITAL_COMMUNITY): Payer: BLUE CROSS/BLUE SHIELD

## 2015-10-23 ENCOUNTER — Inpatient Hospital Stay (HOSPITAL_COMMUNITY)
Admission: RE | Admit: 2015-10-23 | Discharge: 2015-10-25 | DRG: 435 | Disposition: A | Discharge status: Home / Self Care | Payer: BLUE CROSS/BLUE SHIELD | Source: Ambulatory Visit | Attending: Internal Medicine | Admitting: Internal Medicine

## 2015-10-23 ENCOUNTER — Encounter (HOSPITAL_COMMUNITY): Payer: Self-pay | Admitting: Radiology

## 2015-10-23 DIAGNOSIS — E119 Type 2 diabetes mellitus without complications: Secondary | ICD-10-CM | POA: Diagnosis present

## 2015-10-23 DIAGNOSIS — Z8042 Family history of malignant neoplasm of prostate: Secondary | ICD-10-CM

## 2015-10-23 DIAGNOSIS — Z7984 Long term (current) use of oral hypoglycemic drugs: Secondary | ICD-10-CM

## 2015-10-23 DIAGNOSIS — Z808 Family history of malignant neoplasm of other organs or systems: Secondary | ICD-10-CM

## 2015-10-23 DIAGNOSIS — E871 Hypo-osmolality and hyponatremia: Secondary | ICD-10-CM | POA: Diagnosis not present

## 2015-10-23 DIAGNOSIS — R7401 Elevation of levels of liver transaminase levels: Secondary | ICD-10-CM | POA: Diagnosis present

## 2015-10-23 DIAGNOSIS — E785 Hyperlipidemia, unspecified: Secondary | ICD-10-CM | POA: Diagnosis present

## 2015-10-23 DIAGNOSIS — K8689 Other specified diseases of pancreas: Secondary | ICD-10-CM | POA: Diagnosis present

## 2015-10-23 DIAGNOSIS — Z7901 Long term (current) use of anticoagulants: Secondary | ICD-10-CM

## 2015-10-23 DIAGNOSIS — C250 Malignant neoplasm of head of pancreas: Principal | ICD-10-CM | POA: Diagnosis present

## 2015-10-23 DIAGNOSIS — I482 Chronic atrial fibrillation: Secondary | ICD-10-CM

## 2015-10-23 DIAGNOSIS — H9190 Unspecified hearing loss, unspecified ear: Secondary | ICD-10-CM | POA: Diagnosis present

## 2015-10-23 DIAGNOSIS — K869 Disease of pancreas, unspecified: Secondary | ICD-10-CM

## 2015-10-23 DIAGNOSIS — F17210 Nicotine dependence, cigarettes, uncomplicated: Secondary | ICD-10-CM | POA: Diagnosis present

## 2015-10-23 DIAGNOSIS — F419 Anxiety disorder, unspecified: Secondary | ICD-10-CM | POA: Diagnosis present

## 2015-10-23 DIAGNOSIS — K831 Obstruction of bile duct: Secondary | ICD-10-CM | POA: Diagnosis present

## 2015-10-23 DIAGNOSIS — N2889 Other specified disorders of kidney and ureter: Secondary | ICD-10-CM | POA: Diagnosis not present

## 2015-10-23 DIAGNOSIS — Z8249 Family history of ischemic heart disease and other diseases of the circulatory system: Secondary | ICD-10-CM

## 2015-10-23 DIAGNOSIS — Z833 Family history of diabetes mellitus: Secondary | ICD-10-CM

## 2015-10-23 DIAGNOSIS — R17 Unspecified jaundice: Secondary | ICD-10-CM | POA: Diagnosis not present

## 2015-10-23 DIAGNOSIS — I4891 Unspecified atrial fibrillation: Secondary | ICD-10-CM | POA: Diagnosis present

## 2015-10-23 DIAGNOSIS — I10 Essential (primary) hypertension: Secondary | ICD-10-CM | POA: Diagnosis present

## 2015-10-23 DIAGNOSIS — K5900 Constipation, unspecified: Secondary | ICD-10-CM | POA: Diagnosis present

## 2015-10-23 DIAGNOSIS — Z950 Presence of cardiac pacemaker: Secondary | ICD-10-CM

## 2015-10-23 DIAGNOSIS — R109 Unspecified abdominal pain: Secondary | ICD-10-CM

## 2015-10-23 DIAGNOSIS — Z79899 Other long term (current) drug therapy: Secondary | ICD-10-CM

## 2015-10-23 DIAGNOSIS — R740 Nonspecific elevation of levels of transaminase and lactic acid dehydrogenase [LDH]: Secondary | ICD-10-CM | POA: Diagnosis present

## 2015-10-23 LAB — PROTIME-INR
INR: 1.06 (ref 0.00–1.49)
Prothrombin Time: 14 seconds (ref 11.6–15.2)

## 2015-10-23 LAB — COMPREHENSIVE METABOLIC PANEL
ALBUMIN: 3.4 g/dL — AB (ref 3.5–5.0)
ALK PHOS: 591 U/L — AB (ref 38–126)
ALT: 179 U/L — AB (ref 17–63)
AST: 139 U/L — AB (ref 15–41)
Anion gap: 10 (ref 5–15)
BUN: 13 mg/dL (ref 6–20)
CALCIUM: 9.7 mg/dL (ref 8.9–10.3)
CO2: 21 mmol/L — AB (ref 22–32)
CREATININE: 1.01 mg/dL (ref 0.61–1.24)
Chloride: 97 mmol/L — ABNORMAL LOW (ref 101–111)
GFR calc Af Amer: >60 mL/min (ref >60)
GFR calc non Af Amer: >60 mL/min (ref >60)
GLUCOSE: 108 mg/dL — AB (ref 65–99)
Potassium: 4.1 mmol/L (ref 3.5–5.1)
SODIUM: 128 mmol/L — AB (ref 135–145)
Total Bilirubin: 9.1 mg/dL — ABNORMAL HIGH (ref 0.3–1.2)
Total Protein: 7.6 g/dL (ref 6.5–8.1)

## 2015-10-23 LAB — GLUCOSE, CAPILLARY
GLUCOSE-CAPILLARY: 98 mg/dL (ref 65–99)
GLUCOSE-CAPILLARY: 126 mg/dL — AB (ref 65–99)

## 2015-10-23 LAB — TYPE AND SCREEN
ABO/RH(D): A POS
Antibody Screen: NEGATIVE

## 2015-10-23 LAB — LIPASE, BLOOD: Lipase: 65 U/L — ABNORMAL HIGH (ref 11–51)

## 2015-10-23 LAB — AMYLASE: Amylase: 71 U/L (ref 28–100)

## 2015-10-23 SURGERY — RADIO FREQUENCY ABLATION
Anesthesia: General | Laterality: Right

## 2015-10-23 MED ORDER — INSULIN ASPART 100 UNIT/ML ~~LOC~~ SOLN
0.0000 [IU] | Freq: Three times a day (TID) | SUBCUTANEOUS | Status: DC
  Administered 2015-10-23: 1 [IU] via SUBCUTANEOUS
  Administered 2015-10-24: 2 [IU] via SUBCUTANEOUS

## 2015-10-23 MED ORDER — SUGAMMADEX SODIUM 200 MG/2ML IV SOLN
INTRAVENOUS | Status: AC
  Filled 2015-10-23: qty 2

## 2015-10-23 MED ORDER — PANTOPRAZOLE SODIUM 40 MG PO TBEC
40.0000 mg | DELAYED_RELEASE_TABLET | Freq: Every day | ORAL | Status: DC
  Administered 2015-10-23 – 2015-10-25 (×3): 40 mg via ORAL
  Filled 2015-10-23 (×3): qty 1

## 2015-10-23 MED ORDER — AMLODIPINE BESYLATE 5 MG PO TABS
5.0000 mg | ORAL_TABLET | Freq: Every day | ORAL | Status: DC
  Administered 2015-10-24 – 2015-10-25 (×2): 5 mg via ORAL
  Filled 2015-10-23 (×2): qty 1

## 2015-10-23 MED ORDER — ATORVASTATIN CALCIUM 10 MG PO TABS
10.0000 mg | ORAL_TABLET | Freq: Every day | ORAL | Status: DC
  Administered 2015-10-23 – 2015-10-25 (×3): 10 mg via ORAL
  Filled 2015-10-23 (×3): qty 1

## 2015-10-23 MED ORDER — SODIUM CHLORIDE 0.9 % IJ SOLN
INTRAMUSCULAR | Status: AC
  Filled 2015-10-23: qty 10

## 2015-10-23 MED ORDER — PROPOFOL 10 MG/ML IV BOLUS
INTRAVENOUS | Status: AC
  Filled 2015-10-23: qty 20

## 2015-10-23 MED ORDER — CEFAZOLIN SODIUM-DEXTROSE 2-3 GM-% IV SOLR
INTRAVENOUS | Status: AC
  Filled 2015-10-23: qty 50

## 2015-10-23 MED ORDER — APIXABAN 5 MG PO TABS
5.0000 mg | ORAL_TABLET | Freq: Two times a day (BID) | ORAL | Status: DC
  Administered 2015-10-23 – 2015-10-25 (×4): 5 mg via ORAL
  Filled 2015-10-23 (×5): qty 1

## 2015-10-23 MED ORDER — SODIUM CHLORIDE 0.9 % IV SOLN
INTRAVENOUS | Status: DC
  Administered 2015-10-25: 06:00:00 via INTRAVENOUS

## 2015-10-23 MED ORDER — OXYCODONE HCL 5 MG PO TABS: 5.0000 mg | ORAL_TABLET | ORAL | Status: DC | PRN

## 2015-10-23 MED ORDER — LACTATED RINGERS IV SOLN
INTRAVENOUS | Status: DC
  Administered 2015-10-23: 08:00:00 via INTRAVENOUS

## 2015-10-23 MED ORDER — MIDAZOLAM HCL 2 MG/2ML IJ SOLN
INTRAMUSCULAR | Status: AC
  Filled 2015-10-23: qty 2

## 2015-10-23 MED ORDER — FENTANYL CITRATE (PF) 250 MCG/5ML IJ SOLN
INTRAMUSCULAR | Status: AC
  Filled 2015-10-23: qty 10

## 2015-10-23 MED ORDER — PROMETHAZINE HCL 25 MG/ML IJ SOLN
12.5000 mg | Freq: Four times a day (QID) | INTRAMUSCULAR | Status: DC | PRN
  Filled 2015-10-23: qty 1

## 2015-10-23 MED ORDER — ONDANSETRON HCL 4 MG PO TABS: 4.0000 mg | ORAL_TABLET | Freq: Four times a day (QID) | ORAL | Status: DC | PRN

## 2015-10-23 MED ORDER — IOHEXOL 300 MG/ML  SOLN
100.0000 mL | Freq: Once | INTRAMUSCULAR | Status: AC | PRN
  Administered 2015-10-23: 100 mL via INTRAVENOUS

## 2015-10-23 MED ORDER — HYDROMORPHONE HCL 1 MG/ML IJ SOLN: 1.0000 mg | INTRAMUSCULAR | Status: DC | PRN

## 2015-10-23 MED ORDER — METFORMIN HCL ER 500 MG PO TB24
500.0000 mg | ORAL_TABLET | Freq: Every day | ORAL | Status: DC
  Filled 2015-10-23: qty 1

## 2015-10-23 MED ORDER — ALPRAZOLAM 0.25 MG PO TABS
0.2500 mg | ORAL_TABLET | Freq: Every evening | ORAL | Status: DC | PRN
  Administered 2015-10-23 – 2015-10-24 (×2): 0.25 mg via ORAL
  Filled 2015-10-23 (×2): qty 1

## 2015-10-23 MED ORDER — ADULT MULTIVITAMIN W/MINERALS CH
1.0000 | ORAL_TABLET | Freq: Every day | ORAL | Status: DC
  Administered 2015-10-24 – 2015-10-25 (×2): 1 via ORAL
  Filled 2015-10-23 (×4): qty 1

## 2015-10-23 MED ORDER — CIPROFLOXACIN IN D5W 400 MG/200ML IV SOLN
400.0000 mg | Freq: Two times a day (BID) | INTRAVENOUS | Status: DC
  Administered 2015-10-23 – 2015-10-25 (×4): 400 mg via INTRAVENOUS
  Filled 2015-10-23 (×4): qty 200

## 2015-10-23 MED ORDER — EPHEDRINE SULFATE 50 MG/ML IJ SOLN
INTRAMUSCULAR | Status: AC
  Filled 2015-10-23: qty 1

## 2015-10-23 MED ORDER — CEFAZOLIN SODIUM-DEXTROSE 2-3 GM-% IV SOLR
2.0000 g | INTRAVENOUS | Status: DC
  Filled 2015-10-23: qty 50

## 2015-10-23 MED ORDER — ONDANSETRON HCL 4 MG/2ML IJ SOLN: 4.0000 mg | Freq: Four times a day (QID) | INTRAMUSCULAR | Status: DC | PRN

## 2015-10-23 MED ORDER — ATROPINE SULFATE 0.4 MG/ML IJ SOLN
INTRAMUSCULAR | Status: AC
  Filled 2015-10-23: qty 1

## 2015-10-23 MED ORDER — IRBESARTAN 150 MG PO TABS
150.0000 mg | ORAL_TABLET | Freq: Every day | ORAL | Status: DC
  Administered 2015-10-24 – 2015-10-25 (×2): 150 mg via ORAL
  Filled 2015-10-23 (×2): qty 1

## 2015-10-23 MED ORDER — SODIUM CHLORIDE 0.9 % IV SOLN
INTRAVENOUS | Status: DC
  Administered 2015-10-23: 13:00:00 via INTRAVENOUS

## 2015-10-23 NOTE — H&P (Addendum)
[facility] History and Physical  Antonio Hancock [id number] DOB: [birth year] DOA: 10/23/2015  Referring physician: Everlean Alstrom Jannifer Franklin  PCP: Woody Seller, MD   Chief Complaint: jaundice   HPI:  74 year old male who with known right renal lesion and was earlier in interventional radiology getting cryoablation done. He was noted to be jaundiced and CT abd was requested, noted to have pancreatic mass and [facility] asked to admit firectly to [facility] for further evaluation. Pt denies chest pain or shortness of breath, no fevers, chills, wants to eat.   Assessment and Plan:  Principal Problem:   Jaundice - with noted Pancreatic head mass, Biliary obstruction. 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 Jaundice. EXAM: CT ABDOMEN WITHOUT AND WITH CONTRAST TECHNIQUE: Multidetector CT imaging of the abdomen was performed following the standard protocol before and following the bolus administration of intravenous contrast. CONTRAST:  148mL OMNIPAQUE IOHEXOL 300 MG/ML  SOLN COMPARISON:  06/11/2015 FINDINGS: Lower chest: No acute findings. Hepatobiliary: No liver masses identified. Gallbladder is distended but otherwise unremarkable in appearance. Diffuse intra and extrahepatic biliary ductal dilatation is seen with  common bile duct measuring 17 mm in diameter. Pancreas: Pancreatic ductal dilatation seen throughout the body and tail. Soft tissue mass seen in the pancreatic head measuring 3.0 x 4.1 cm on image 49/series 3, highly suspicious for pancreatic carcinoma. Spleen:  Within normal limits in size and appearance. Adrenal Glands:  No masses identified. Kidneys: Subcapsular lesion in the upper pole of the right kidney remains stable in size, measuring 3.0 cm on image 27/series 5. This continues to show hyper density precontrast, with increase in attenuation values from 34 Hounsfield units precontrast to 47 Hounsfield units postcontrast, which is considered equivocal for contrast enhancement. No evidence of hydronephrosis. Stomach/Bowel/Peritoneum: No evidence of wall thickening, mass, or obstruction involving visualized abdominal bowel. Vascular/Lymphatic: The pancreatic head mass appears to involve the superior mesenteric vein and portal venous confluence. There is no evidence of portal vein thrombus. There is image degradation from streak artifact from spinal fixation rods, however there is mild peripancreatic lymphadenopathy, with largest lymph node measuring 1.5 cm on image 26/series 5. 3.1 cm infrarenal abdominal aortic aneurysm shows no significant change. Other:  None. Musculoskeletal:  No suspicious bone lesions identified. IMPRESSION: 4 cm pancreatic head mass causing diffuse biliary and pancreatic ductal dilatation. This is highly suspicious for primary pancreatic carcinoma. This mass appears to involve the superior mesenteric vein and portal venous confluence. Mild peripancreatic lymphadenopathy, suspicious for metastatic disease. Stable 3 cm indeterminate lesion in upper pole of right kidney, showing equivocal contrast enhancement. Differential diagnosis remains complex renal cyst versus renal neoplasm. Stable 3.1 cm infrarenal abdominal aortic aneurysm. Electronically Signed  By: Earle Gell M.D.   On:  10/23/2015 11:12     Code Status: Full Family Communication: Pt at bedside Disposition Plan: Admit for further evaluation    Mart Piggs [facility] [id number]   Review of Systems:  Constitutional: Negative for diaphoresis.  HENT: Negative for hearing loss, ear pain, nosebleeds, congestion, sore throat, neck pain, tinnitus and ear discharge.   Eyes: Negative for blurred vision, double vision, photophobia, pain, discharge and redness.  Respiratory: Negative for cough, hemoptysis, sputum production, shortness of breath, wheezing and stridor.   Cardiovascular: Negative for chest pain, palpitations, orthopnea, claudication and leg swelling.  Gastrointestinal: Negative for heartburn, constipation, blood in stool and melena.  Genitourinary: Negative for dysuria, urgency, frequency, hematuria and flank pain.  Musculoskeletal: Negative for myalgias, back pain, joint pain and falls.  Skin: Negative for itching and rash.  Neurological: Negative for dizziness and weakness.  Endo/Heme/Allergies: Negative for environmental allergies and polydipsia. Does not bruise/bleed easily.  Psychiatric/Behavioral: Negative for suicidal ideas. The patient is not nervous/anxious.      Past Medical History  Diagnosis Date  . Hypertension   . Diabetes mellitus     Diagnosed Fall 2012  . Mobitz type 2 second degree AV block     11/11/2011  . Tobacco abuse     1/2 ppd x 55 yrs  . Pacemaker 2012    Medtronic Adapta L pulse generator, serial number [id number]  . Chronotropic incompetence with sinus node dysfunction ([place]) 2012    s/p MDT PPM  . Hyperlipidemia   . HOH (hard of hearing)   . Anxiety     Past Surgical History  Procedure Laterality Date  . Back surgery    . Permanent pacemaker insertion N/A 11/12/2011    Procedure: PERMANENT PACEMAKER INSERTION;  Surgeon: Deboraha Sprang, MD;  Location: [facility] CATH LAB;  Service: Cardiovascular;  Laterality: Left; Medtronic Adapta L pulse generator, serial number  [id number]    . Hernia repair  1996    left inguinal hernia repair     Social History:  reports that he has been smoking Cigarettes.  He has a 50 pack-year smoking history. He has never used smokeless tobacco. He reports that he does not drink alcohol or use illicit drugs.  No Known Allergies  Family History  Problem Relation Age of Onset  . Brain cancer Mother     deceased 64  . Prostate cancer Father     deceased 79  . Deep vein thrombosis Father   . Hypertension Father   . Diabetes Father   . Heart attack Neg Hx   . Hypertension Sister   . Hypertension Brother   . Cancer Father     2  . Cancer Mother     58    Prior to Admission medications   Medication Sig Start Date End Date Taking? Authorizing Provider  ALPRAZolam Duanne Moron) 0.25 MG tablet Take 0.25 mg by mouth at bedtime as needed for anxiety.  11/04/13  Yes Historical Provider, MD  amLODipine (NORVASC) 5 MG tablet TAKE 1 TABLET DAILY 08/05/15  Yes Historical Provider, MD  apixaban (ELIQUIS) 5 MG TABS tablet Take 1 tablet (5 mg total) by mouth 2 (two) times daily. 08/20/15  Yes Rhonda G Barrett, PA-C  atorvastatin (LIPITOR) 10 MG tablet Take 10 mg by mouth daily.   Yes Historical Provider, MD  metFORMIN (GLUCOPHAGE-XR) 500 MG 24 hr tablet Take 500 mg by mouth daily with supper.     Yes Historical Provider, MD  Multiple  Vitamins-Minerals (MULTIVITAMINS THER. W/MINERALS) TABS Take 1 tablet by mouth daily.     Yes Historical Provider, MD  olmesartan (BENICAR) 40 MG tablet Take 40 mg by mouth daily.   Yes Historical Provider, MD  pantoprazole (PROTONIX) 40 MG tablet Take 40 mg by mouth daily.   Yes Historical Provider, MD    Physical Exam: Filed Vitals:   10/23/15 0645  BP: 122/60  Pulse: 78  Temp: 97.5 F (36.4 C)  TempSrc: Oral  Resp: 18  Height: 5\' 11"  (1.803 m)  Weight: 77.111 kg (170 lb)  SpO2: 100%    Physical Exam  Constitutional: Appears well-developed and well-nourished. No distress.  HENT: Oropharynx is  clear and moist.  Eyes: Conjunctivae and EOM are normal. PERRLA Neck: Normal ROM. Neck supple. No JVD. No tracheal deviation. No thyromegaly.  CVS: RRR,  no gallops, no carotid bruit.  Pulmonary: Effort and breath sounds normal, no stridor, rhonchi, wheezes, rales.  Abdominal: Soft. BS +,  no distension, tenderness in upper quadrants, no rebound or guarding.  Musculoskeletal: Normal range of motion. No edema and no tenderness.  Lymphadenopathy: No lymphadenopathy noted, cervical, inguinal. Neuro: Alert. Normal reflexes, muscle tone coordination. No cranial nerve deficit. Skin: Skin is warm and dry. Jaundiced  Psychiatric: Normal mood and affect. Behavior, judgment, thought content normal.   Labs on Admission:  Basic Metabolic Panel:  Recent Labs Lab 10/23/15 0754  NA 128*  K 4.1  CL 97*  CO2 21*  GLUCOSE 108*  BUN 13  CREATININE 1.01  CALCIUM 9.7   Liver Function Tests:  Recent Labs Lab 10/23/15 0754  AST 139*  ALT 179*  ALKPHOS 591*  BILITOT 9.1*  PROT 7.6  ALBUMIN 3.4*    Recent Labs Lab 10/23/15 0754  LIPASE 65*  AMYLASE 71   CBG:  Recent Labs Lab 10/23/15 0812  GLUCAP 98    EKG: pending    If 7PM-7AM, please contact night-coverage [email or web address] Password [email or web address] 10/23/2015, 11:43 AM

## 2015-10-23 NOTE — Anesthesia Preprocedure Evaluation (Addendum)
Anesthesia Evaluation    Airway        Dental   Pulmonary sleep apnea , Current Smoker,           Cardiovascular hypertension, Pt. on medications + pacemaker      Neuro/Psych    GI/Hepatic   Endo/Other  diabetes, Type 2, Oral Hypoglycemic Agents  Renal/GU      Musculoskeletal   Abdominal   Peds  Hematology   Anesthesia Other Findings   Reproductive/Obstetrics                            Anesthesia Physical Anesthesia Plan  ASA:   Anesthesia Plan:    Post-op Pain Management:    Induction:   Airway Management Planned:   Additional Equipment:   Intra-op Plan:   Post-operative Plan:   Informed Consent:   Plan Discussed with:   Anesthesia Plan Comments: (Case cancelled by Dr. Pascal Lux. Pt is jaundiced today. LFT's elevated. Needs GI evaluation.)        Anesthesia Quick Evaluation

## 2015-10-23 NOTE — Consult Note (Signed)
Reason for Consult: Biliary obstruction and pancreatic head mass Referring Physician: [facility]  Antonio Hancock HPI: This is a 74 year old male who is well-known to me for complaints of constipation, nausea, and vomiting.  Initially he required further evaluation of the right renal lesion, but his constipation precluded a clear view of the renal lesion for imaging.  Treatment was applied and his constipation resolved.  The colonoscopy was also negative for any luminal lesions.  Unfortunately he continued to have nausea.  A GES was performed, and it was negative.  I did not think that his symptoms were coming from his renal lesion, but I told him I would reassess after the cryoablation of the renal lesion.  Upon arrival for the cryoablation he was noted to be jaundiced.  Dr. Pascal Lux confirmed the hyperbilirubinemia and a repeat CT scan was performed.  Unlike the prior CT scans this summer, a new 3 cm pancreatic head mass was identified along with intrahepatic and extrahepatic biliary ductal dilation.  In retrospect he believes he started having dark urine over a month ago.  Also, he has lost weight and his diarrhea has worsened.  Past Medical History  Diagnosis Date  . Hypertension   . Diabetes mellitus     Diagnosed Fall 2012  . Mobitz type 2 second degree AV block     11/11/2011  . Tobacco abuse     1/2 ppd x 55 yrs  . Pacemaker 2012    Medtronic Adapta L pulse generator, serial number [id number]  . Chronotropic incompetence with sinus node dysfunction ([place]) 2012    s/p MDT PPM  . Hyperlipidemia   . HOH (hard of hearing)   . Anxiety     Past Surgical History  Procedure Laterality Date  . Back surgery    . Permanent pacemaker insertion N/A 11/12/2011    Procedure: PERMANENT PACEMAKER INSERTION;  Surgeon: Deboraha Sprang, MD;  Location: [facility] CATH LAB;  Service: Cardiovascular;  Laterality: Left; Medtronic Adapta L pulse generator, serial number [id number]    . Hernia repair  1996   left inguinal hernia repair     Family History  Problem Relation Age of Onset  . Brain cancer Mother     deceased 27  . Prostate cancer Father     deceased 32  . Deep vein thrombosis Father   . Hypertension Father   . Diabetes Father   . Heart attack Neg Hx   . Hypertension Sister   . Hypertension Brother   . Cancer Father     39  . Cancer Mother     42    Social History:  reports that he has been smoking Cigarettes.  He has a 50 pack-year smoking history. He has never used smokeless tobacco. He reports that he does not drink alcohol or use illicit drugs.  Allergies: No Known Allergies  Medications:  Scheduled: . amLODipine  5 mg Oral Daily  . apixaban  5 mg Oral BID  . atorvastatin  10 mg Oral Daily  . irbesartan  150 mg Oral Daily  . metFORMIN  500 mg Oral Q supper  . multivitamin with minerals  1 tablet Oral Daily  . pantoprazole  40 mg Oral Daily   Continuous: . sodium chloride    . sodium chloride      Results for orders placed or performed during the hospital encounter of 10/23/15 (from the past 24 hour(s))  Protime-INR     Status: None   Collection Time: 10/23/15  7:54 AM  Result Value Ref Range   Prothrombin Time 14.0 11.6 - 15.2 seconds   INR 1.06 0.00 - 1.49  Comprehensive metabolic panel     Status: Abnormal   Collection Time: 10/23/15  7:54 AM  Result Value Ref Range   Sodium 128 (L) 135 - 145 mmol/L   Potassium 4.1 3.5 - 5.1 mmol/L   Chloride 97 (L) 101 - 111 mmol/L   CO2 21 (L) 22 - 32 mmol/L   Glucose, Bld 108 (H) 65 - 99 mg/dL   BUN 13 6 - 20 mg/dL   Creatinine, Ser 1.01 0.61 - 1.24 mg/dL   Calcium 9.7 8.9 - 10.3 mg/dL   Total Protein 7.6 6.5 - 8.1 g/dL   Albumin 3.4 (L) 3.5 - 5.0 g/dL   AST 139 (H) 15 - 41 U/L   ALT 179 (H) 17 - 63 U/L   Alkaline Phosphatase 591 (H) 38 - 126 U/L   Total Bilirubin 9.1 (H) 0.3 - 1.2 mg/dL   GFR calc non Af Amer >60 >60 mL/min   GFR calc Af Amer >60 >60 mL/min   Anion gap 10 5 - 15  Amylase     Status:  None   Collection Time: 10/23/15  7:54 AM  Result Value Ref Range   Amylase 71 28 - 100 U/L  Lipase, blood     Status: Abnormal   Collection Time: 10/23/15  7:54 AM  Result Value Ref Range   Lipase 65 (H) 11 - 51 U/L  Glucose, capillary     Status: None   Collection Time: 10/23/15  8:12 AM  Result Value Ref Range   Glucose-Capillary 98 65 - 99 mg/dL   Comment 1 Notify RN      Ct Abd Wo & W Cm  10/23/2015  CLINICAL DATA:  Right upper quadrant abdominal pain and nausea for 5 months. Jaundice. EXAM: CT ABDOMEN WITHOUT AND WITH CONTRAST TECHNIQUE: Multidetector CT imaging of the abdomen was performed following the standard protocol before and following the bolus administration of intravenous contrast. CONTRAST:  111mL OMNIPAQUE IOHEXOL 300 MG/ML  SOLN COMPARISON:  06/11/2015 FINDINGS: Lower chest: No acute findings. Hepatobiliary: No liver masses identified. Gallbladder is distended but otherwise unremarkable in appearance. Diffuse intra and extrahepatic biliary ductal dilatation is seen with common bile duct measuring 17 mm in diameter. Pancreas: Pancreatic ductal dilatation seen throughout the body and tail. Soft tissue mass seen in the pancreatic head measuring 3.0 x 4.1 cm on image 49/series 3, highly suspicious for pancreatic carcinoma. Spleen:  Within normal limits in size and appearance. Adrenal Glands:  No masses identified. Kidneys: Subcapsular lesion in the upper pole of the right kidney remains stable in size, measuring 3.0 cm on image 27/series 5. This continues to show hyper density precontrast, with increase in attenuation values from 34 Hounsfield units precontrast to 47 Hounsfield units postcontrast, which is considered equivocal for contrast enhancement. No evidence of hydronephrosis. Stomach/Bowel/Peritoneum: No evidence of wall thickening, mass, or obstruction involving visualized abdominal bowel. Vascular/Lymphatic: The pancreatic head mass appears to involve the superior mesenteric  vein and portal venous confluence. There is no evidence of portal vein thrombus. There is image degradation from streak artifact from spinal fixation rods, however there is mild peripancreatic lymphadenopathy, with largest lymph node measuring 1.5 cm on image 26/series 5. 3.1 cm infrarenal abdominal aortic aneurysm shows no significant change. Other:  None. Musculoskeletal:  No suspicious bone lesions identified. IMPRESSION: 4 cm pancreatic head mass causing diffuse biliary and  pancreatic ductal dilatation. This is highly suspicious for primary pancreatic carcinoma. This mass appears to involve the superior mesenteric vein and portal venous confluence. Mild peripancreatic lymphadenopathy, suspicious for metastatic disease. Stable 3 cm indeterminate lesion in upper pole of right kidney, showing equivocal contrast enhancement. Differential diagnosis remains complex renal cyst versus renal neoplasm. Stable 3.1 cm infrarenal abdominal aortic aneurysm. Electronically Signed   By: Earle Gell M.D.   On: 10/23/2015 11:12    ROS:  As stated above in the HPI otherwise negative.  Blood pressure 129/61, pulse 63, temperature 98.2 F (36.8 C), temperature source Oral, resp. rate 18, height 5\' 11"  (1.803 m), weight 77.111 kg (170 lb), SpO2 100 %.    PE: Gen: NAD, Alert and Oriented, jaundice HEENT:  [place]/AT, EOMI Neck: Supple, no LAD Lungs: CTA Bilaterally CV: RRR without M/G/R ABM: Soft, NTND, +BS Ext: No C/C/E  Assessment/Plan: 1) Pancreatic head mass. 2) Biliary obstruction. 3) Jaundice.   I had a very long discussion with the patient and his family.  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  My first and foremost goal is to place a stent to allow for drainage.  He can be discharged home after the stent placement the following day to ensure no complications from the procedure.  I will start him on Cipro 400 mg IV  Q12 hours for prophylaxis and using Creon will be helpful.  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  Plan: 1) ERCP tomorrow with stent placment. 2) EUS/FNA in the near future. 3) Cirpo 400 mg IV q12 hours.  Colburn Asper D 10/23/2015, 1:38 PM

## 2015-10-23 NOTE — Progress Notes (Signed)
Patient has returned to room 1311 after CT. Procedure in IR has been cancelled today. Awaiting orders from Dr Pascal Lux. Family and patient updated.

## 2015-10-23 NOTE — Progress Notes (Signed)
Request for admission, pt with renal mass was in IR today for cryoablation and found to be jaundiced. CT Abd requested and [facility] asked to admit for evaluation. Dr. Benson Norway made aware.  Faye Ramsay, MD  [facility] Pager [phone]  If 7PM-7AM, please contact night-coverage [email or web address] Password [email or web address]

## 2015-10-23 NOTE — Progress Notes (Signed)
Report called to [facility] on [place].  Pt will be taken to 1343 via wheelchair.

## 2015-10-23 NOTE — H&P (Signed)
Chief Complaint: Patient was seen in consultation today for Right renal mass cryoablation at the request of Dr Alyson Ingles  Referring Physician(s): Dr Alyson Ingles  History of Present Illness: Antonio Hancock is a 74 y.o. male   Pt suffering still from abdominal pain--especially RUQ Nausea/diarrhea Wt loss; appetite change 30 lbs loss since 05/2015 Was evaluated and CT revealed: IMPRESSION: 1. There is equivocal enhancement associated with the hyperdense lesion arising from the upper pole of right kidney. The degree of increased Hounsfield units between the pre and post-contrast images may reflect pseudoenhancement. Consider further assessment of this lesion with ultrasound to evaluate for solid internal components. The presence of solid internal components or thickened septation would favor a renal cell carcinoma. 2. Increased mesenteric edema compared with 05/29/2015. In the acute setting this is favored to represent sequelae of inflammation or infection. Clinical correlation and recommended. 3. Aortic atherosclerosis 4. Abdominal aortic aneurysm. Recommend followup by ultrasound in 3 years. This recommendation follows ACR consensus guidelines: White Paper of the ACR Incidental Findings Committee II on Vascular Findings. Joellyn Rued Radiol 2013; 10:789-794  Referred to Dr Ronny Bacon for consideration of Right renal mass ablation to include possible biopsy Pt now scheduled for same Noticeable today pt has definite jaundice of skin and sclera (new finding since original consult 07/2015    Past Medical History  Diagnosis Date  . Hypertension   . Diabetes mellitus     Diagnosed Fall 2012  . Mobitz type 2 second degree AV block     11/11/2011  . Tobacco abuse     1/2 ppd x 55 yrs  . Pacemaker 2012    Medtronic Adapta L pulse generator, serial number [id number]  . Chronotropic incompetence with sinus node dysfunction ([place]) 2012    s/p MDT PPM  . Hyperlipidemia   . HOH (hard of  hearing)   . Anxiety     Past Surgical History  Procedure Laterality Date  . Back surgery    . Permanent pacemaker insertion N/A 11/12/2011    Procedure: PERMANENT PACEMAKER INSERTION;  Surgeon: Deboraha Sprang, MD;  Location: [facility] CATH LAB;  Service: Cardiovascular;  Laterality: Left; Medtronic Adapta L pulse generator, serial number [id number]    . Hernia repair  1996    left inguinal hernia repair     Allergies: Review of patient's allergies indicates no known allergies.  Medications: Prior to Admission medications   Medication Sig Start Date End Date Taking? Authorizing Provider  ALPRAZolam Duanne Moron) 0.25 MG tablet Take 0.25 mg by mouth at bedtime as needed for anxiety.  11/04/13   Historical Provider, MD  amLODipine (NORVASC) 5 MG tablet TAKE 1 TABLET DAILY 08/05/15   Historical Provider, MD  apixaban (ELIQUIS) 5 MG TABS tablet Take 1 tablet (5 mg total) by mouth 2 (two) times daily. 08/20/15   Rhonda G Barrett, PA-C  atorvastatin (LIPITOR) 10 MG tablet Take 10 mg by mouth daily.    Historical Provider, MD  metFORMIN (GLUCOPHAGE-XR) 500 MG 24 hr tablet Take 500 mg by mouth daily with supper.      Historical Provider, MD  Multiple Vitamins-Minerals (MULTIVITAMINS THER. W/MINERALS) TABS Take 1 tablet by mouth daily.      Historical Provider, MD  olmesartan (BENICAR) 40 MG tablet Take 40 mg by mouth daily.    Historical Provider, MD  pantoprazole (PROTONIX) 40 MG tablet Take 40 mg by mouth daily.    Historical Provider, MD     Family History  Problem Relation Age of  Onset  . Brain cancer Mother     deceased 60  . Prostate cancer Father     deceased 44  . Deep vein thrombosis Father   . Hypertension Father   . Diabetes Father   . Heart attack Neg Hx   . Hypertension Sister   . Hypertension Brother   . Cancer Father     16  . Cancer Mother     28    Social History   Social History  . Marital Status: Married    Spouse Name: N/A  . Number of Children: N/A  . Years of  Education: N/A   Social History Main Topics  . Smoking status: Current Every Day Smoker -- 1.00 packs/day for 50 years    Types: Cigarettes  . Smokeless tobacco: Never Used  . Alcohol Use: No  . Drug Use: No  . Sexual Activity: Not Asked   Other Topics Concern  . None   Social History Narrative   Gambles on the Internet     Review of Systems: A 12 point ROS discussed and pertinent positives are indicated in the HPI above.  All other systems are negative.  Review of Systems  Constitutional: Positive for activity change, appetite change, fatigue and unexpected weight change. Negative for fever.  Respiratory: Negative for shortness of breath.   Cardiovascular: Negative for chest pain.  Gastrointestinal: Positive for nausea, abdominal pain and diarrhea.  Genitourinary: Negative for difficulty urinating.  Musculoskeletal: Negative for back pain.  Neurological: Positive for weakness.  Psychiatric/Behavioral: Negative for behavioral problems and confusion.    Vital Signs: BP: 122/60; T: 97.5; P: 78; R: 18; O2 sat: 100% RA  Physical Exam  Constitutional: He is oriented to person, place, and time.  Cardiovascular: Normal rate, regular rhythm and normal heart sounds.   No murmur heard. Pulmonary/Chest: Effort normal and breath sounds normal. He has no wheezes.  Abdominal: Soft. Bowel sounds are normal. There is tenderness.  RUQ pain to palpate area  Musculoskeletal: Normal range of motion.  Neurological: He is alert and oriented to person, place, and time.  Skin: Skin is warm and dry.  Psychiatric: He has a normal mood and affect. His behavior is normal. Judgment and thought content normal.  Nursing note and vitals reviewed.   Mallampati Score:  MD Evaluation Airway: WNL Heart: WNL Abdomen: WNL Chest/ Lungs: WNL ASA  Classification: 3 Mallampati/Airway Score: One  Imaging: No results found.  Labs:  CBC:  Recent Labs  10/16/15 0910  WBC 3.8*  HGB 12.4*  HCT  34.8*  PLT 276    COAGS:  Recent Labs  10/16/15 0910  INR 1.42  APTT 34    BMP:  Recent Labs  10/16/15 0910  NA 127*  K 5.0  CL 94*  CO2 23  GLUCOSE 127*  BUN 11  CALCIUM 9.9  CREATININE 0.83  GFRNONAA >60  GFRAA >60    LIVER FUNCTION TESTS: No results for input(s): BILITOT, AST, ALT, ALKPHOS, PROT, ALBUMIN in the last 8760 hours.  TUMOR MARKERS: No results for input(s): AFPTM, CEA, CA199, CHROMGRNA in the last 8760 hours.  Assessment and Plan:  Abdominal pain Wt loss (30 lbs since June 2016) Known Rt renal mass  Scheduled for Right renal mass cryoablation with possible biopsy to be performed in IR today Risks and Benefits discussed with the patient including, but not limited to failure to treat entire lesion, bleeding, infection, damage to adjacent structures, decrease in renal function or post procedural neuropathy. All  of the patient's questions were answered, patient is agreeable to proceed. Consent signed and in chart.   Thank you for this interesting consult.  I greatly enjoyed meeting Breken Zilliox and look forward to participating in their care.  A copy of this report was sent to the requesting provider on this date.  Signed: Tris Howell A 10/23/2015, 8:19 AM   I spent a total of  30 Minutes   in face to face in clinical consultation, greater than 50% of which was counseling/coordinating care for Rt renal mass ablation

## 2015-10-24 ENCOUNTER — Encounter (HOSPITAL_COMMUNITY)
Admission: AD | Disposition: A | Discharge status: Home / Self Care | Payer: Self-pay | Source: Ambulatory Visit | Attending: Internal Medicine

## 2015-10-24 ENCOUNTER — Encounter (HOSPITAL_COMMUNITY): Payer: Self-pay

## 2015-10-24 ENCOUNTER — Observation Stay (HOSPITAL_COMMUNITY): Payer: BLUE CROSS/BLUE SHIELD | Admitting: Anesthesiology

## 2015-10-24 ENCOUNTER — Observation Stay (HOSPITAL_COMMUNITY): Payer: BLUE CROSS/BLUE SHIELD

## 2015-10-24 DIAGNOSIS — Z79899 Other long term (current) drug therapy: Secondary | ICD-10-CM | POA: Diagnosis not present

## 2015-10-24 DIAGNOSIS — Z7984 Long term (current) use of oral hypoglycemic drugs: Secondary | ICD-10-CM | POA: Diagnosis not present

## 2015-10-24 DIAGNOSIS — H9190 Unspecified hearing loss, unspecified ear: Secondary | ICD-10-CM | POA: Diagnosis present

## 2015-10-24 DIAGNOSIS — Z8042 Family history of malignant neoplasm of prostate: Secondary | ICD-10-CM | POA: Diagnosis not present

## 2015-10-24 DIAGNOSIS — F17210 Nicotine dependence, cigarettes, uncomplicated: Secondary | ICD-10-CM | POA: Diagnosis present

## 2015-10-24 DIAGNOSIS — C250 Malignant neoplasm of head of pancreas: Secondary | ICD-10-CM | POA: Diagnosis present

## 2015-10-24 DIAGNOSIS — F419 Anxiety disorder, unspecified: Secondary | ICD-10-CM | POA: Diagnosis present

## 2015-10-24 DIAGNOSIS — Z7901 Long term (current) use of anticoagulants: Secondary | ICD-10-CM | POA: Diagnosis not present

## 2015-10-24 DIAGNOSIS — I482 Chronic atrial fibrillation: Secondary | ICD-10-CM | POA: Diagnosis not present

## 2015-10-24 DIAGNOSIS — I4891 Unspecified atrial fibrillation: Secondary | ICD-10-CM | POA: Diagnosis present

## 2015-10-24 DIAGNOSIS — I10 Essential (primary) hypertension: Secondary | ICD-10-CM | POA: Diagnosis present

## 2015-10-24 DIAGNOSIS — K5900 Constipation, unspecified: Secondary | ICD-10-CM | POA: Diagnosis present

## 2015-10-24 DIAGNOSIS — R17 Unspecified jaundice: Secondary | ICD-10-CM | POA: Diagnosis not present

## 2015-10-24 DIAGNOSIS — E871 Hypo-osmolality and hyponatremia: Secondary | ICD-10-CM | POA: Diagnosis not present

## 2015-10-24 DIAGNOSIS — R740 Nonspecific elevation of levels of transaminase and lactic acid dehydrogenase [LDH]: Secondary | ICD-10-CM | POA: Diagnosis present

## 2015-10-24 DIAGNOSIS — N2889 Other specified disorders of kidney and ureter: Secondary | ICD-10-CM | POA: Diagnosis not present

## 2015-10-24 DIAGNOSIS — K869 Disease of pancreas, unspecified: Secondary | ICD-10-CM | POA: Diagnosis not present

## 2015-10-24 DIAGNOSIS — R1011 Right upper quadrant pain: Secondary | ICD-10-CM | POA: Diagnosis present

## 2015-10-24 DIAGNOSIS — Z8249 Family history of ischemic heart disease and other diseases of the circulatory system: Secondary | ICD-10-CM | POA: Diagnosis not present

## 2015-10-24 DIAGNOSIS — Z808 Family history of malignant neoplasm of other organs or systems: Secondary | ICD-10-CM | POA: Diagnosis not present

## 2015-10-24 DIAGNOSIS — E785 Hyperlipidemia, unspecified: Secondary | ICD-10-CM | POA: Diagnosis present

## 2015-10-24 DIAGNOSIS — Z950 Presence of cardiac pacemaker: Secondary | ICD-10-CM | POA: Diagnosis not present

## 2015-10-24 DIAGNOSIS — E119 Type 2 diabetes mellitus without complications: Secondary | ICD-10-CM | POA: Diagnosis present

## 2015-10-24 DIAGNOSIS — K831 Obstruction of bile duct: Secondary | ICD-10-CM | POA: Diagnosis present

## 2015-10-24 DIAGNOSIS — Z833 Family history of diabetes mellitus: Secondary | ICD-10-CM | POA: Diagnosis not present

## 2015-10-24 HISTORY — PX: ERCP: SHX5425

## 2015-10-24 HISTORY — PX: BILIARY STENT PLACEMENT: SHX5538

## 2015-10-24 LAB — GLUCOSE, CAPILLARY
GLUCOSE-CAPILLARY: 102 mg/dL — AB (ref 65–99)
GLUCOSE-CAPILLARY: 152 mg/dL — AB (ref 65–99)
GLUCOSE-CAPILLARY: 111 mg/dL — AB (ref 65–99)
Glucose-Capillary: 120 mg/dL — ABNORMAL HIGH (ref 65–99)
Glucose-Capillary: 97 mg/dL (ref 65–99)

## 2015-10-24 LAB — CBC
HEMATOCRIT: 28.2 % — AB (ref 39.0–52.0)
Hemoglobin: 10.1 g/dL — ABNORMAL LOW (ref 13.0–17.0)
MCH: 30.8 pg (ref 26.0–34.0)
MCHC: 35.8 g/dL (ref 30.0–36.0)
MCV: 86 fL (ref 78.0–100.0)
Platelets: 217 10*3/uL (ref 150–400)
RBC: 3.28 MIL/uL — ABNORMAL LOW (ref 4.22–5.81)
RDW: 16.3 % — AB (ref 11.5–15.5)
WBC: 3.1 10*3/uL — AB (ref 4.0–10.5)

## 2015-10-24 LAB — COMPREHENSIVE METABOLIC PANEL
ALBUMIN: 2.9 g/dL — AB (ref 3.5–5.0)
ALK PHOS: 486 U/L — AB (ref 38–126)
ALT: 143 U/L — AB (ref 17–63)
AST: 110 U/L — AB (ref 15–41)
Anion gap: 7 (ref 5–15)
BUN: 12 mg/dL (ref 6–20)
CHLORIDE: 97 mmol/L — AB (ref 101–111)
CO2: 23 mmol/L (ref 22–32)
Calcium: 9.1 mg/dL (ref 8.9–10.3)
Creatinine, Ser: 1.06 mg/dL (ref 0.61–1.24)
GFR calc Af Amer: >60 mL/min (ref >60)
Glucose, Bld: 87 mg/dL (ref 65–99)
POTASSIUM: 4 mmol/L (ref 3.5–5.1)
SODIUM: 127 mmol/L — AB (ref 135–145)
Total Bilirubin: 8 mg/dL — ABNORMAL HIGH (ref 0.3–1.2)
Total Protein: 6.5 g/dL (ref 6.5–8.1)

## 2015-10-24 LAB — CANCER ANTIGEN 19-9: CA 19 9: 67 U/mL — AB (ref 0–35)

## 2015-10-24 SURGERY — ERCP, WITH INTERVENTION IF INDICATED
Anesthesia: General

## 2015-10-24 MED ORDER — SUCCINYLCHOLINE CHLORIDE 20 MG/ML IJ SOLN
INTRAMUSCULAR | Status: DC | PRN
  Administered 2015-10-24: 100 mg via INTRAVENOUS

## 2015-10-24 MED ORDER — ONDANSETRON HCL 4 MG/2ML IJ SOLN
INTRAMUSCULAR | Status: DC | PRN
  Administered 2015-10-24: 4 mg via INTRAVENOUS

## 2015-10-24 MED ORDER — PHENYLEPHRINE 40 MCG/ML (10ML) SYRINGE FOR IV PUSH (FOR BLOOD PRESSURE SUPPORT)
PREFILLED_SYRINGE | INTRAVENOUS | Status: AC
  Filled 2015-10-24: qty 10

## 2015-10-24 MED ORDER — LACTATED RINGERS IV SOLN: INTRAVENOUS | Status: DC

## 2015-10-24 MED ORDER — INDOMETHACIN 50 MG RE SUPP
50.0000 mg | Freq: Once | RECTAL | Status: AC
  Administered 2015-10-24: 50 mg via RECTAL

## 2015-10-24 MED ORDER — LACTATED RINGERS IV SOLN
INTRAVENOUS | Status: DC
  Administered 2015-10-24 (×2): via INTRAVENOUS

## 2015-10-24 MED ORDER — FENTANYL CITRATE (PF) 100 MCG/2ML IJ SOLN: 25.0000 ug | INTRAMUSCULAR | Status: DC | PRN

## 2015-10-24 MED ORDER — PROMETHAZINE HCL 25 MG/ML IJ SOLN: 6.2500 mg | INTRAMUSCULAR | Status: DC | PRN

## 2015-10-24 MED ORDER — LIDOCAINE HCL (CARDIAC) 20 MG/ML IV SOLN
INTRAVENOUS | Status: AC
  Filled 2015-10-24: qty 5

## 2015-10-24 MED ORDER — PROPOFOL 10 MG/ML IV BOLUS
INTRAVENOUS | Status: AC
  Filled 2015-10-24: qty 20

## 2015-10-24 MED ORDER — PROMETHAZINE HCL 25 MG/ML IJ SOLN: 6.2500 mg | Freq: Four times a day (QID) | INTRAMUSCULAR | Status: DC | PRN

## 2015-10-24 MED ORDER — FENTANYL CITRATE (PF) 100 MCG/2ML IJ SOLN
INTRAMUSCULAR | Status: AC
  Filled 2015-10-24: qty 2

## 2015-10-24 MED ORDER — PHENYLEPHRINE HCL 10 MG/ML IJ SOLN
INTRAMUSCULAR | Status: DC | PRN
  Administered 2015-10-24 (×2): 80 ug via INTRAVENOUS

## 2015-10-24 MED ORDER — GLUCAGON HCL RDNA (DIAGNOSTIC) 1 MG IJ SOLR
INTRAMUSCULAR | Status: DC | PRN
  Administered 2015-10-24: .5 mg via INTRAVENOUS

## 2015-10-24 MED ORDER — LIDOCAINE HCL (CARDIAC) 20 MG/ML IV SOLN
INTRAVENOUS | Status: DC | PRN
  Administered 2015-10-24: 50 mg via INTRAVENOUS

## 2015-10-24 MED ORDER — PANCRELIPASE (LIP-PROT-AMYL) 12000-38000 UNITS PO CPEP
36000.0000 [IU] | ORAL_CAPSULE | Freq: Three times a day (TID) | ORAL | Status: DC
  Administered 2015-10-24 – 2015-10-25 (×3): 36000 [IU] via ORAL
  Filled 2015-10-24 (×6): qty 3

## 2015-10-24 MED ORDER — SODIUM CHLORIDE 0.9 % IV SOLN
INTRAVENOUS | Status: DC | PRN
  Administered 2015-10-24: 35 mL

## 2015-10-24 MED ORDER — FENTANYL CITRATE (PF) 100 MCG/2ML IJ SOLN
INTRAMUSCULAR | Status: DC | PRN
  Administered 2015-10-24 (×2): 50 ug via INTRAVENOUS

## 2015-10-24 MED ORDER — PANCRELIPASE (LIP-PROT-AMYL) 12000-38000 UNITS PO CPEP: 12000.0000 [IU] | ORAL_CAPSULE | Freq: Three times a day (TID) | ORAL | Status: DC

## 2015-10-24 MED ORDER — ONDANSETRON HCL 4 MG/2ML IJ SOLN
INTRAMUSCULAR | Status: AC
  Filled 2015-10-24: qty 2

## 2015-10-24 MED ORDER — LACTATED RINGERS IV SOLN
INTRAVENOUS | Status: DC | PRN
  Administered 2015-10-24: 07:00:00 via INTRAVENOUS

## 2015-10-24 MED ORDER — INDOMETHACIN 50 MG RE SUPP
RECTAL | Status: AC
  Filled 2015-10-24: qty 2

## 2015-10-24 MED ORDER — PROPOFOL 10 MG/ML IV BOLUS
INTRAVENOUS | Status: DC | PRN
  Administered 2015-10-24: 180 mg via INTRAVENOUS

## 2015-10-24 MED ORDER — MEPERIDINE HCL 50 MG/ML IJ SOLN: 6.2500 mg | INTRAMUSCULAR | Status: DC | PRN

## 2015-10-24 NOTE — Transfer of Care (Signed)
Immediate Anesthesia Transfer of Care Note  Patient: Antonio Hancock  Procedure(s) Performed: Procedure(s): ENDOSCOPIC RETROGRADE CHOLANGIOPANCREATOGRAPHY (ERCP) (N/A) BILIARY STENT PLACEMENT (N/A)  Patient Location: PACU  Anesthesia Type:General  Level of Consciousness: awake, alert  and oriented  Airway & Oxygen Therapy: Patient Spontanous Breathing and Patient connected to face mask oxygen  Post-op Assessment: Report given to RN and Post -op Vital signs reviewed and stable  Post vital signs: Reviewed and stable  Last Vitals:  Filed Vitals:   10/24/15 0911  BP: 116/41  Pulse: 67  Temp: 37 C  Resp: 14    Complications: No apparent anesthesia complications

## 2015-10-24 NOTE — Anesthesia Postprocedure Evaluation (Signed)
  Anesthesia Post-op Note  Patient: Antonio Hancock  Procedure(s) Performed: Procedure(s) (LRB): ENDOSCOPIC RETROGRADE CHOLANGIOPANCREATOGRAPHY (ERCP) (N/A) BILIARY STENT PLACEMENT (N/A)  Patient Location: PACU  Anesthesia Type: General  Level of Consciousness: awake and alert   Airway and Oxygen Therapy: Patient Spontanous Breathing  Post-op Pain: mild  Post-op Assessment: Post-op Vital signs reviewed, Patient's Cardiovascular Status Stable, Respiratory Function Stable, Patent Airway and No signs of Nausea or vomiting  Last Vitals:  Filed Vitals:   10/24/15 0911  BP: 116/41  Pulse: 67  Temp: 37 C  Resp: 14    Post-op Vital Signs: stable   Complications: No apparent anesthesia complications

## 2015-10-24 NOTE — Progress Notes (Signed)
Patient ID: Antonio Hancock, male   DOB: [birth year], 74 y.o.   MRN: [id number] [facility] PROGRESS NOTE  Antonio Hancock [id number] DOB: July 18, 1941 DOA: 10/23/2015 PCP: Woody Seller, MD   Brief narrative:    74 year old male who with known right renal lesion and was earlier in interventional radiology getting cryoablation done. He was noted to be jaundiced and CT abd was requested, noted to have pancreatic mass and [facility] asked to [facility]ct[facility] to [facility] for further evaluation. Pt denies chest pain or shortness of breath, no fevers, chills, wants to eat.   Assessment/Plan:    Principal Problem:  Jaundice - with noted Pancreatic head mass, Biliary obstruction. - appreciate GI team assistance - pt is s/p ERCP - advance diet as pt able to tolerate   Active Problems:  Generalized ABD pain with N/V/D - provide analgesia, antiemetics as needed - also placed on pancreatic enzymes   Transaminitis - secondary to the above - trending down   Pancreatic mass - ? Adenocarcinoma  Atrial fibrillation (HCC) - rate controlled - no chest pain or shortness of breath - continue with Eliquis   Renal mass, right - s/p cryoablation this afternoon   Hyponatremia - continue to provide IVF, repeat BMP In AM  DM type II - hold metformin and place on SSI for now  DVT prophylaxis - on Eliquis   Code Status: Full.  Family Communication:  plan of care discussed with the patient Disposition Plan: Home when stable.   IV access:  Peripheral IV  Procedures and diagnostic studies:    Ct Abd Wo & W Cm 10/23/2015  4 cm pancreatic head mass causing diffuse biliary and pancreatic ductal dilatation. This is highly suspicious for primary pancreatic carcinoma. This mass appears to involve the superior mesenteric vein and portal venous confluence. Mild peripancreatic lymphadenopathy, suspicious for metastatic disease. Stable 3 cm indeterminate lesion in upper pole of right kidney, showing  equivocal contrast enhancement. Differential diagnosis remains complex renal cyst versus renal neoplasm. Stable 3.1 cm infrarenal abdominal aortic aneurysm.   Medical Consultants:  GI  Other Consultants:  None  IAnti-Infectives:   Cipro 11/17 -->   Faye Ramsay, MD  [facility] Pager [phone]  If 7PM-7AM, please contact night-coverage [email or web address] Password [email or web address] 10/24/2015, 8:06 AM   LOS: 1 day   HPI/Subjective: No events overnight.   Objective: Filed Vitals:   10/23/15 1508 10/23/15 2323 10/24/15 0559 10/24/15 0654  BP: 123/66 104/43 122/66 137/58  Pulse: 71 61 69 64  Temp: 98.2 F (36.8 C) 98.2 F (36.8 C) 98.1 F (36.7 C) 98.5 F (36.9 C)  TempSrc: Oral Oral Oral Oral  Resp: 16 18 18 20   Height:    5\' 11"  (1.803 m)  Weight:    73.483 kg (162 lb)  SpO2: 100% 98% 96% 99%    Intake/Output Summary (Last 24 hours) at 10/24/15 0806 Last data filed at 10/23/15 1940  Gross per 24 hour  Intake    240 ml  Output      0 ml  Net    240 ml    Exam:   General:  Pt is alert, not in acute distress  Cardiovascular: Regular rate and rhythm, S1/S2, no murmurs, no rubs, no gallops  Respiratory: Clear to auscultation bilaterally, no wheezing, no crackles, no rhonchi  Abdomen: Soft, non tender, non distended, bowel sounds present, no guarding  Data Reviewed: Basic Metabolic Panel:  Recent Labs Lab 10/23/15 0754 10/24/15 0414  NA 128* 127*  K 4.1 4.0  CL 97* 97*  CO2 21* 23  GLUCOSE 108* 87  BUN 13 12  CREATININE 1.01 1.06  CALCIUM 9.7 9.1   Liver Function Tests:  Recent Labs Lab 10/23/15 0754 10/24/15 0414  AST 139* 110*  ALT 179* 143*  ALKPHOS 591* 486*  BILITOT 9.1* 8.0*  PROT 7.6 6.5  ALBUMIN 3.4* 2.9*    Recent Labs Lab 10/23/15 0754  LIPASE 65*  AMYLASE 71   CBC:  Recent Labs Lab 10/24/15 0414  WBC 3.1*  HGB 10.1*  HCT 28.2*  MCV 86.0  PLT 217   CBG:  Recent Labs Lab 10/23/15 0812 10/23/15 1725 10/23/15 2047  GLUCAP  98 126* 120*   Scheduled Meds: . amLODipine  5 mg Oral Daily  . apixaban  5 mg Oral BID  . atorvastatin  10 mg Oral Daily  . ciprofloxacin  400 mg Intravenous Q12H  . insulin aspart  0-9 Units Subcutaneous TID WC  . irbesartan  150 mg Oral Daily  . multivitamin with minerals  1 tablet Oral Daily  . pantoprazole  40 mg Oral Daily   Continuous Infusions: . sodium chloride    . sodium chloride 20 mL/hr at 10/23/15 1300

## 2015-10-24 NOTE — Progress Notes (Addendum)
Patient ID: Antonio Hancock, male   DOB: 12-30-1940, 74 y.o.   MRN: [id number] CT abdomen and pelvis on 11/17 revealed 4 cm pancreatic head mass with biliary and pancreatic ductal dilatation as well as mild peripancreatic lymphadenopathy, stable 3 cm upper pole right renal lesion and stable 3.1 cm infrarenal AAA. Patient status post ERCP today noting extrinsic compression of common bile duct with a dilated duct. Patient currently denies significant abdominal pain, nausea/ vomiting. VSS;AF. Plan is for EUS with FNA by GI in the next 1-2 weeks. CA-19-9  67, total bilirubin 8.0, WBC 3.1, hemoglobin 10.1, platelet 217k. Right renal mass cryoablation will be put on hold until GI workup has been completed.

## 2015-10-24 NOTE — Anesthesia Procedure Notes (Signed)
Procedure Name: Intubation Date/Time: 10/24/2015 7:34 AM Performed by: Noralyn Pick D Pre-anesthesia Checklist: Patient identified, Emergency Drugs available, Suction available and Patient being monitored Patient Re-evaluated:Patient Re-evaluated prior to inductionOxygen Delivery Method: Circle System Utilized Preoxygenation: Pre-oxygenation with 100% oxygen Intubation Type: IV induction Ventilation: Mask ventilation without difficulty Grade View: Grade III Tube type: Oral Tube size: 7.5 mm Number of attempts: 1 Airway Equipment and Method: Stylet and Oral airway Placement Confirmation: ETT inserted through vocal cords under direct vision,  positive ETCO2 and breath sounds checked- equal and bilateral Secured at: 22 cm Tube secured with: Tape Dental Injury: Teeth and Oropharynx as per pre-operative assessment

## 2015-10-24 NOTE — Interval H&P Note (Signed)
History and Physical Interval Note:  10/24/2015 6:59 AM  Antonio Hancock  has presented today for surgery, with the diagnosis of Pancreatic head mass and biliary obstruction  The various methods of treatment have been discussed with the patient and family. After consideration of risks, benefits and other options for treatment, the patient has consented to  Procedure(s): ENDOSCOPIC RETROGRADE CHOLANGIOPANCREATOGRAPHY (ERCP) (N/A) BILIARY STENT PLACEMENT (N/A) as a surgical intervention .  The patient's history has been reviewed, patient examined, no change in status, stable for surgery.  I have reviewed the patient's chart and labs.  Questions were answered to the patient's satisfaction.    The patient was given his Eliquis last evening, even though I discussed the situation with his day shift nurse to hold his anticoagulation.  I will avoid a sphincterotomy or to minimize the cut.   Antonio Hancock

## 2015-10-24 NOTE — Op Note (Signed)
[facility] [place], [address]   ERCP PROCEDURE REPORT        EXAM DATE: 10/24/2015  PATIENT NAME:          Antonio Hancock, Antonio Hancock          MR #: [id number] BIRTHDATE:       January 21, 1941     VISIT #:     [id number] ATTENDING:     Carol Ada, MD     STATUS:     inpatient ASSISTANT:      Sharon Mt and Alinda Deem  INDICATIONS:  The patient is a 74 yr old male here for an ERCP due to pancreatic head mass with biliary obstructino. PROCEDURE PERFORMED:     ERCP with stent placement MEDICATIONS:     General anesthesia and 100 mg of Indomethacin PR.   CONSENT: The patient understands the risks and benefits of the procedure and understands that these risks include, but are not limited to: sedation, allergic reaction, infection, perforation and/or bleeding. Alternative means of evaluation and treatment include, among others: physical exam, x-rays, and/or surgical intervention. The patient elects to proceed with this endoscopic procedure.  DESCRIPTION OF PROCEDURE: During intra-op preparation period all mechanical & medical equipment was checked for proper function. Hand hygiene and appropriate measures for infection prevention was taken. After the risks, benefits and alternatives of the procedure were thoroughly explained, Informed was verified, confirmed and timeout was successfully executed by the treatment team. With the patient in left semi-prone position, medications were administered intravenously.The    was passed from the mouth into the esophagus and further advanced from the esophagus into the stomach. From stomach scope was directed to the second portion of the duodenum. Major papilla was aligned with the duodenoscope. The scope position was confirmed fluoroscopically. Rest of the findings/therapeutics are given below. The scope was then completely withdrawn from the patient and the procedure completed. The pulse, BP, and  O2 saturation were monitored and documented by the physician and the nursing staff throughout the entire procedure. The patient was cared for as planned according to standard protocol. The patient was then discharged to recovery in stable condition and with appropriate post procedure care. Estimated blood loss is zero unless otherwise noted in this procedure report.  The ampulla was located the second portion of the duodenum and there was some spontaneous drainage of bile.  Locating the ampulla was difficult as well as cannulation as the patient has an altered anatomy.  A typical en fosse position was not possible and I had to work from a distance from the CBD in the short position.  The long position only pushed the scope further away from the ampulla. There was also a small periampullary diverticulum.  Cannulation of the CBD was very difficult.  I was not certain if I had initially cannulated the PD versus the CBD.  Presuming the proximal PD was cannulated, the guidewire was looped in this area and then a secondary wire was employed.  It did not appear that this secondary wire left in the PD was beneficial, and in retrospect, I felt that the CBD was cannulated from the first attempt.  To confirm that the wire was in the CBD bile was aspirated from the sphincterotome. Contrast injection then revealed a dilated distal CBD and then an abrupt cut off.  After multiple attempts, the guidewire was able to looped, straightened, and then advanced into the right hepatic ducts.  Contrast injection revealed a 2 cm dilated  proximal and mid CBD.  An extrinsic compression of the CBD measured 3 cm.  An 8.5 Fr x 5 cm stent was successfully inserted and excellent drainage of the CBD was achieved.  At this point, the procedure was terminated.Marland Kitchen    ADVERSE EVENT:     No immediate. IMPRESSIONS:     1) Extrinsic compression of the CBD. 2) Dilated CBD.  RECOMMENDATIONS:     1) Follow liver panel. 2)  If the patient is well later today, he can be discharged home. 3) I will schedule the EUS with FNA in the next 1-2 weeks.  REPEAT EXAM:   ___________________________________ Carol Ada, MD eSigned:  Carol Ada, MD 11-07-2015 9:17 AM   cc:  CPT CODES: ICD9 CODES:  The ICD and CPT codes recommended by this software are interpretations from the data that the clinical staff has captured with the software.  The verification of the translation of this report to the ICD and CPT codes and modifiers is the sole responsibility of the health care institution and practicing physician where this report was generated.  [place] will not be held responsible for the validity of the ICD and CPT codes included on this report.  AMA assumes no liability for data contained or not contained herein. CPT is a Designer, television/film set of the [employer].   PATIENT NAME:  Antonio Hancock, Antonio Hancock MR#: [id number]

## 2015-10-24 NOTE — H&P (View-Only) (Signed)
Reason for Consult: Biliary obstruction and pancreatic head mass Referring Physician: [facility]  [name] HPI: This is a 74 year old male who is well-known to me for complaints of constipation, nausea, and vomiting.  Initially he required further evaluation of the right renal lesion, but his constipation precluded a clear view of the renal lesion for imaging.  Treatment was applied and his constipation resolved.  The colonoscopy was also negative for any luminal lesions.  Unfortunately he continued to have nausea.  A GES was performed, and it was negative.  I did not think that his symptoms were coming from his renal lesion, but I told him I would reassess after the cryoablation of the renal lesion.  Upon arrival for the cryoablation he was noted to be jaundiced.  Dr. Pascal Lux confirmed the hyperbilirubinemia and a repeat CT scan was performed.  Unlike the prior CT scans this summer, a new 3 cm pancreatic head mass was identified along with intrahepatic and extrahepatic biliary ductal dilation.  In retrospect he believes he started having dark urine over a month ago.  Also, he has lost weight and his diarrhea has worsened.  Past Medical History  Diagnosis Date  . Hypertension   . Diabetes mellitus     Diagnosed Fall 2012  . Mobitz type 2 second degree AV block     11/11/2011  . Tobacco abuse     1/2 ppd x 55 yrs  . Pacemaker 2012    Medtronic Adapta L pulse generator, serial number [id number]  . Chronotropic incompetence with sinus node dysfunction ([place]) 2012    s/p MDT PPM  . Hyperlipidemia   . HOH (hard of hearing)   . Anxiety     Past Surgical History  Procedure Laterality Date  . Back surgery    . Permanent pacemaker insertion N/A 11/12/2011    Procedure: PERMANENT PACEMAKER INSERTION;  Surgeon: Deboraha Sprang, MD;  Location: [facility] C[facility] LAB;  Service: Cardiovascular;  Laterality: Left; Medtronic Adapta L pulse generator, serial number [id number]    . Hernia repair  1996   left inguinal hernia repair     Family History  Problem Relation Age of Onset  . Brain cancer Mother     deceased 73  . Prostate cancer Father     deceased 31  . Deep vein thrombosis Father   . Hypertension Father   . Diabetes Father   . Heart attack Neg Hx   . Hypertension Sister   . Hypertension Brother   . Cancer Father     33  . Cancer Mother     55    Social History:  reports that he has been smoking Cigarettes.  He has a 50 pack-year smoking history. He has never used smokeless tobacco. He reports that he does not drink alcohol or use illicit drugs.  Allergies: No Known Allergies  Medications:  Scheduled: . amLODipine  5 mg Oral Daily  . apixaban  5 mg Oral BID  . atorvastatin  10 mg Oral Daily  . irbesartan  150 mg Oral Daily  . metFORMIN  500 mg Oral Q supper  . multivitamin with minerals  1 tablet Oral Daily  . pantoprazole  40 mg Oral Daily   Continuous: . sodium chloride    . sodium chloride      Results for orders placed or performed during the hospital encounter of 10/23/15 (from the past 24 hour(s))  Protime-INR     Status: None   Collection Time: 10/23/15  7:54 AM  Result Value Ref Range   Prothrombin Time 14.0 11.6 - 15.2 seconds   INR 1.06 0.00 - 1.49  Comprehensive metabolic panel     Status: Abnormal   Collection Time: 10/23/15  7:54 AM  Result Value Ref Range   Sodium 128 (L) 135 - 145 mmol/L   Potassium 4.1 3.5 - 5.1 mmol/L   Chloride 97 (L) 101 - 111 mmol/L   CO2 21 (L) 22 - 32 mmol/L   Glucose, Bld 108 (H) 65 - 99 mg/dL   BUN 13 6 - 20 mg/dL   Creatinine, Ser 1.01 0.61 - 1.24 mg/dL   Calcium 9.7 8.9 - 10.3 mg/dL   Total Protein 7.6 6.5 - 8.1 g/dL   Albumin 3.4 (L) 3.5 - 5.0 g/dL   AST 139 (H) 15 - 41 U/L   ALT 179 (H) 17 - 63 U/L   Alkaline Phosphatase 591 (H) 38 - 126 U/L   Total Bilirubin 9.1 (H) 0.3 - 1.2 mg/dL   GFR calc non Af Amer >60 >60 mL/min   GFR calc Af Amer >60 >60 mL/min   Anion gap 10 5 - 15  Amylase     Status:  None   Collection Time: 10/23/15  7:54 AM  Result Value Ref Range   Amylase 71 28 - 100 U/L  Lipase, blood     Status: Abnormal   Collection Time: 10/23/15  7:54 AM  Result Value Ref Range   Lipase 65 (H) 11 - 51 U/L  Glucose, capillary     Status: None   Collection Time: 10/23/15  8:12 AM  Result Value Ref Range   Glucose-Capillary 98 65 - 99 mg/dL   Comment 1 Notify RN      Ct Abd Wo & W Cm  10/23/2015  CLINICAL DATA:  Right upper quadrant abdominal pain and nausea for 5 months. Jaundice. EXAM: CT ABDOMEN WITHOUT AND WITH CONTRAST TECHNIQUE: Multidetector CT imaging of the abdomen was performed following the standard protocol before and following the bolus administration of intravenous contrast. CONTRAST:  178mL OMNIPAQUE IOHEXOL 300 MG/ML  SOLN COMPARISON:  06/11/2015 FINDINGS: Lower chest: No acute findings. Hepatobiliary: No liver masses identified. Gallbladder is distended but otherwise unremarkable in appearance. Diffuse intra and extrahepatic biliary ductal dilatation is seen with common bile duct measuring 17 mm in diameter. Pancreas: Pancreatic ductal dilatation seen throughout the body and tail. Soft tissue mass seen in the pancreatic head measuring 3.0 x 4.1 cm on image 49/series 3, highly suspicious for pancreatic carcinoma. Spleen:  Within normal limits in size and appearance. Adrenal Glands:  No masses identified. Kidneys: Subcapsular lesion in the upper pole of the right kidney remains stable in size, measuring 3.0 cm on image 27/series 5. This continues to show hyper density precontrast, with increase in attenuation values from 34 Hounsfield units precontrast to 47 Hounsfield units postcontrast, which is considered equivocal for contrast enhancement. No evidence of hydronephrosis. Stomach/Bowel/Peritoneum: No evidence of wall thickening, mass, or obstruction involving visualized abdominal bowel. Vascular/Lymphatic: The pancreatic head mass appears to involve the superior mesenteric  vein and portal venous confluence. There is no evidence of portal vein thrombus. There is image degradation from streak artifact from spinal fixation rods, however there is mild peripancreatic lymphadenopathy, with largest lymph node measuring 1.5 cm on image 26/series 5. 3.1 cm infrarenal abdominal aortic aneurysm shows no significant change. Other:  None. Musculoskeletal:  No suspicious bone lesions identified. IMPRESSION: 4 cm pancreatic head mass causing diffuse biliary and  pancreatic ductal dilatation. This is highly suspicious for primary pancreatic carcinoma. This mass appears to involve the superior mesenteric vein and portal venous confluence. Mild peripancreatic lymphadenopathy, suspicious for metastatic disease. Stable 3 cm indeterminate lesion in upper pole of right kidney, showing equivocal contrast enhancement. Differential diagnosis remains complex renal cyst versus renal neoplasm. Stable 3.1 cm infrarenal abdominal aortic aneurysm. Electronically Signed   By: Earle Gell M.D.   On: 10/23/2015 11:12    ROS:  As stated above in the HPI otherwise negative.  Blood pressure 129/61, pulse 63, temperature 98.2 F (36.8 C), temperature source Oral, resp. rate 18, height 5\' 11"  (1.803 m), weight 77.111 kg (170 lb), SpO2 100 %.    PE: Gen: NAD, Alert and Oriented, jaundice HEENT:  [place]/AT, EOMI Neck: Supple, no LAD Lungs: CTA Bilaterally CV: RRR without M/G/R ABM: Soft, NTND, +BS Ext: No C/C/E  Assessment/Plan: 1) Pancreatic head mass. 2) Biliary obstruction. 3) Jaundice.   I had a very long discussion with the patient and his family.  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  My first and foremost goal is to place a stent to allow for drainage.  He can be discharged home after the stent placement the following day to ensure no complications from the procedure.  I will start him on Cipro 400 mg IV  Q12 hours for prophylaxis and using Creon will be helpful.  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  Plan: 1) ERCP tomorrow with stent placment. 2) EUS/FNA in the near future. 3) Cirpo 400 mg IV q12 hours.  Seraiah Nowack D 10/23/2015, 1:38 PM

## 2015-10-24 NOTE — Anesthesia Preprocedure Evaluation (Signed)
Anesthesia Evaluation  Patient identified by MRN, date of birth, ID band Patient awake    Reviewed: Allergy & Precautions, NPO status , Patient's Chart, lab work & pertinent test results  Airway Mallampati: II  TM Distance: >3 FB Neck ROM: Full    Dental no notable dental hx.    Pulmonary sleep apnea , Current Smoker,    Pulmonary exam normal breath sounds clear to auscultation       Cardiovascular hypertension, Pt. on medications Normal cardiovascular exam+ pacemaker  Rhythm:Regular Rate:Normal     Neuro/Psych negative neurological ROS  negative psych ROS   GI/Hepatic negative GI ROS, Neg liver ROS, Pancreatic mass   Endo/Other  diabetes, Type 2, Oral Hypoglycemic Agents  Renal/GU negative Renal ROS  negative genitourinary   Musculoskeletal negative musculoskeletal ROS (+)   Abdominal   Peds negative pediatric ROS (+)  Hematology negative hematology ROS (+)   Anesthesia Other Findings   Reproductive/Obstetrics negative OB ROS                             Anesthesia Physical Anesthesia Plan  ASA: III  Anesthesia Plan: General   Post-op Pain Management:    Induction: Intravenous  Airway Management Planned: Oral ETT  Additional Equipment:   Intra-op Plan:   Post-operative Plan: Extubation in OR  Informed Consent: I have reviewed the patients History and Physical, chart, labs and discussed the procedure including the risks, benefits and alternatives for the proposed anesthesia with the patient or authorized representative who has indicated his/her understanding and acceptance.   Dental advisory given  Plan Discussed with: CRNA  Anesthesia Plan Comments:         Anesthesia Quick Evaluation

## 2015-10-25 DIAGNOSIS — E871 Hypo-osmolality and hyponatremia: Secondary | ICD-10-CM

## 2015-10-25 LAB — GLUCOSE, CAPILLARY: GLUCOSE-CAPILLARY: 90 mg/dL (ref 65–99)

## 2015-10-25 MED ORDER — ONDANSETRON HCL 4 MG PO TABS
4.0000 mg | ORAL_TABLET | Freq: Four times a day (QID) | ORAL | Status: DC | PRN
Start: 2015-10-25 — End: 2015-11-26

## 2015-10-25 MED ORDER — PANCRELIPASE (LIP-PROT-AMYL) 36000-114000 UNITS PO CPEP: 36000.0000 [IU] | ORAL_CAPSULE | Freq: Three times a day (TID) | ORAL | Status: DC

## 2015-10-25 MED ORDER — OXYCODONE HCL 5 MG PO TABS: 5.0000 mg | ORAL_TABLET | ORAL | Status: DC | PRN

## 2015-10-25 NOTE — Discharge Instructions (Signed)
Endoscopic Retrograde Cholangiopancreatography (ERCP) Endoscopic retrograde cholangiopancreatography (ERCP) is a procedure used to diagnosis many diseases of the pancreas, bile ducts, liver, and gallbladder. During ERCP a thin, lighted tube (endoscope) is passed through the mouth and down the back of the throat into the first part of the small intestine (duodenum). A small, plastic tube (cannula) is then passed through the endoscope and directed into the bile duct or pancreatic duct. Dye is then injected through the cannula and X-rays are taken to study the biliary and pancreatic passageways.  LET [facility] CARE PROVIDER KNOW ABOUT:   Any allergies you have.   All medicines you are taking, including vitamins, herbs, eyedrops, creams, and over-the-counter medicines.   Previous problems you or members of your family have had with the use of anesthetics.   Any blood disorders you have.   Previous surgeries you have had.   Medical conditions you have. RISKS AND COMPLICATIONS Generally, ERCP is a safe procedure. However, as with any procedure, complications can occur. A simple removal of gallstones has the lowest rate of complications. Higher rates of complication occur in people who have poorly functioning bile or pancreatic ducts. Possible complications include:   Pancreatitis.  Bleeding.  Accidental punctures in the bowel wall, pancreas, or gall bladder.  Gall bladder or bile duct infection. BEFORE THE PROCEDURE   Do not eat or drink anything, including water, for at least 8 hours before the procedure or as directed by your health care provider.   Ask your health care provider whether you should stop taking certain medicines prior to your procedure.   Arrange for someone to drive you home. You will not be allowed to drive for [id number] hours after the procedure. PROCEDURE   You will be given medicine through a vein (intravenously) to make you relaxed and sleepy.   You might  have a breathing tube placed to give you medicine that makes you sleep (general anesthetic).   Your throat may be sprayed with medicine that numbs the area and prevents gagging (local anesthetic), or you may gargle this medicine.   You will lie on your left side.   The endoscope will be inserted through your mouth and into the duodenum. The tube will not interfere with your breathing. Gagging is prevented by the anesthesia.   While X-rays are being taken, you may be positioned on your stomach.   A small sample of tissue (biopsy) may be removed for examination. AFTER THE PROCEDURE   You will rest in bed until you are fully conscious.   When you first wake up, your throat may feel slightly sore.   You will not be allowed to eat or drink until numbness subsides.   Once you are able to drink, urinate, and sit on the edge of the bed without feeling sick to your stomach (nauseous) or dizzy, you may be allowed to go home.   This information is not intended to replace advice given to you by your health care provider. Make sure you discuss any questions you have with your health care provider.   Document Released: 08/17/2001 Document Revised: 09/12/2013 Document Reviewed: 07/03/2013 Elsevier Interactive Patient Education [employer].

## 2015-10-25 NOTE — Progress Notes (Signed)
Pt discharged home with spouse in stable condition. Discharge instructions and scripts given. Pt verbalized understanding 

## 2015-10-25 NOTE — Discharge Summary (Addendum)
Physician Discharge Summary  Antonio Hancock [id number] DOB: [birth year] DOA: 10/23/2015  PCP: Woody Seller, MD  Admit date: 10/23/2015 Discharge date: 10/25/2015  Recommendations for Outpatient Follow-up:  1. Pt will need to follow up with PCP in 2-3 weeks post discharge 2. Please obtain CMP to evaluate electrolytes and kidney function, liver function  3. Pt is /p ERCP, needs outpatient follow up with Dr. Benson Norway  4. Pt also needs referral to oncology for ? Pancreatic cancer  5. Plan is for EUS with FNA by GI in the next 1-2 weeks. Right renal mass cryoablation will be put on hold until GI workup has been completed.  Discharge Diagnoses:  Principal Problem:   Jaundice Active Problems:   Transaminitis   Pancreatic mass   Atrial fibrillation (HCC)   Renal mass, right   Hyponatremia  Discharge Condition: Stable  Diet recommendation: Heart healthy diet discussed in details    Brief narrative:    74 year old male who with known right renal lesion and was earlier in interventional radiology getting cryoablation done. He was noted to be jaundiced and CT abd was requested, noted to have pancreatic mass and TR[facility] asked to admit firectly to [facility] for further evaluation. Pt denies chest pain or shortness of breath, no fevers, chills, wants to eat.   Assessment/Plan:    Principal Problem:  Jaundice - with noted Pancreatic head mass, Biliary obstruction. - this is highly suspicious for pancreatic cancer - pt wants to go home today and wants outpatient referral to oncology for further evaluation of pancreatic mass - appreciate GI team assistance - pt is s/p ERCP, will need EUS in 1-2 weeks by Dr. 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  Family Communication: plan of care discussed with the patient and wife at bedside  Disposition Plan: Home  IV access:  Peripheral IV  Procedures and diagnostic studies:   Ct Abd Wo & W Cm 10/23/2015 4 cm pancreatic head mass causing diffuse biliary and pancreatic ductal dilatation. This is highly suspicious for primary pancreatic carcinoma. This mass appears to involve the superior mesenteric vein and portal venous confluence. Mild peripancreatic lymphadenopathy, suspicious for metastatic disease. Stable 3 cm indeterminate lesion in upper pole of right kidney, showing equivocal contrast enhancement. Differential diagnosis remains complex renal cyst versus renal neoplasm. Stable 3.1 cm infrarenal abdominal aortic aneurysm.   Medical Consultants:  GI  Other Consultants:  None  IAnti-Infectives:   Cipro 11/17 -->        Discharge Exam: Filed Vitals:   10/25/15 0517  BP: 133/71  Pulse: 73  Temp: 97.9 F (36.6 C)  Resp: 14   Filed Vitals:   10/24/15 0911 10/24/15 1257 10/24/15 2054 10/25/15 0517  BP: 116/41 136/66 105/46 133/71  Pulse: 67 68 61 73  Temp: 98.6 F (  37 C) 98 F (36.7 C) 98.2 F (36.8 C) 97.9 F (36.6 C)  TempSrc: Oral Oral Oral Oral  Resp: 14 16 16 14   Height:      Weight:      SpO2: 99% 100% 98% 98%    General: Pt is alert, follows commands appropriately, not in acute distress Cardiovascular: Regular rate and rhythm, S1/S2 +, no murmurs, no rubs, no gallops Respiratory: Clear to auscultation bilaterally, no wheezing, no crackles, no  rhonchi Abdominal: Soft, non tender, non distended, bowel sounds +, no guarding Extremities: no edema, no cyanosis, pulses palpable bilaterally DP and PT Neuro: Grossly nonfocal  Discharge Instructions  Discharge Instructions    Diet - low sodium heart healthy    Complete by:  As directed      Increase activity slowly    Complete by:  As directed             Medication List    TAKE these medications        ALPRAZolam 0.25 MG tablet  Commonly known as:  XANAX  Take 0.25 mg by mouth at bedtime as needed for anxiety.     amLODipine 5 MG tablet  Commonly known as:  NORVASC  TAKE 1 TABLET DAILY     apixaban 5 MG Tabs tablet  Commonly known as:  ELIQUIS  Take 1 tablet (5 mg total) by mouth 2 (two) times daily.     atorvastatin 10 MG tablet  Commonly known as:  LIPITOR  Take 10 mg by mouth daily.     lipase/protease/amylase 36000 UNITS Cpep capsule  Commonly known as:  CREON  Take 1 capsule (36,000 Units total) by mouth 3 (three) times daily with meals.     metFORMIN 500 MG 24 hr tablet  Commonly known as:  GLUCOPHAGE-XR  Take 500 mg by mouth daily with supper.     multivitamins ther. w/minerals Tabs tablet  Take 1 tablet by mouth daily.     olmesartan 40 MG tablet  Commonly known as:  BENICAR  Take 40 mg by mouth daily.     ondansetron 4 MG tablet  Commonly known as:  ZOFRAN  Take 1 tablet (4 mg total) by mouth every 6 (six) hours as needed for nausea.     oxyCODONE 5 MG immediate release tablet  Commonly known as:  Oxy IR/ROXICODONE  Take 1 tablet (5 mg total) by mouth every 4 (four) hours as needed for moderate pain.     pantoprazole 40 MG tablet  Commonly known as:  PROTONIX  Take 40 mg by mouth daily.           Follow-up Information    Follow up with Woody Seller, MD.   Specialty:  [facility] Medicine   Contact information:   [address] [place] [address] [phone]        The results of significant diagnostics from this  hospitalization (including imaging, microbiology, ancillary and laboratory) are listed below for reference.     Microbiology: No results found for this or any previous visit (from the past 240 hour(s)).   Labs: Basic Metabolic Panel:  Recent Labs Lab 10/23/15 0754 10/24/15 0414  NA 128* 127*  K 4.1 4.0  CL 97* 97*  CO2 21* 23  GLUCOSE 108* 87  BUN 13 12  CREATININE 1.01 1.06  CALCIUM 9.7 9.1   Liver Function Tests:  Recent Labs Lab 10/23/15 0754 10/24/15 0414  AST 139* 110*  ALT 179* 143*  ALKPHOS  591* 486*  BILITOT 9.1* 8.0*  PROT 7.6 6.5  ALBUMIN 3.4* 2.9*    Recent Labs Lab 10/23/15 0754  LIPASE 65*  AMYLASE 71   CBC:  Recent Labs Lab 10/24/15 0414  WBC 3.1*  HGB 10.1*  HCT 28.2*  MCV 86.0  PLT 217   CBG:  Recent Labs Lab 10/24/15 1041 10/24/15 1216 10/24/15 1706 10/24/15 2058 10/25/15 0734  GLUCAP 97 102* 152* 111* 90   SIGNED: Time coordinating discharge: 30 minutes  MAGICK-MYERS, ISKRA, MD  [facility] 10/25/2015, 9:29 AM Pager [phone]  If 7PM-7AM, please contact night-coverage [email or web address] Password [email or web address]

## 2015-10-27 ENCOUNTER — Encounter (HOSPITAL_COMMUNITY): Payer: Self-pay | Admitting: Gastroenterology

## 2015-10-28 ENCOUNTER — Other Ambulatory Visit: Payer: Self-pay | Admitting: Gastroenterology

## 2015-10-29 ENCOUNTER — Encounter (HOSPITAL_COMMUNITY): Payer: Self-pay | Admitting: *Deleted

## 2015-11-03 ENCOUNTER — Telehealth: Payer: Self-pay | Admitting: Internal Medicine

## 2015-11-03 NOTE — Telephone Encounter (Signed)
Aris Georgia, [place], RN           Pt has a CHADS score of 2 and no TIA/Stroke. Ok to hold Eliquis x 48 hours prior to procedure.       Previous Messages     ----- Message -----   From: Emily Filbert, RN   Sent: 11/03/2015  8:20 AM    To: Aris Georgia, RPH, Leeroy Bock, RPH   Good morning!   I just pulled a fax out of Dr. Olin Pia box for this patient.  He is scheduled for an endoscopic ultrasound with Dr. Benson Norway on 11/07/15 and is on Eliquis.  Per Dr. Benson Norway- how long can he hold Eliquis prior?  Please review- Dr. Caryl Comes out all week.   Thanks!  Nira Conn

## 2015-11-06 NOTE — Interval H&P Note (Signed)
History and Physical Interval Note:  11/06/2015 3:42 PM  Antonio Hancock  has presented today for surgery, with the diagnosis of pancreatic mass  The various methods of treatment have been discussed with the patient and family. After consideration of risks, benefits and other options for treatment, the patient has consented to  Procedure(s): FULL UPPER ENDOSCOPIC ULTRASOUND (EUS) RADIAL (N/A) FINE NEEDLE ASPIRATION (FNA) LINEAR (N/A) ENDOSCOPIC RETROGRADE CHOLANGIOPANCREATOGRAPHY (ERCP) (N/A) as a surgical intervention .  The patient's history has been reviewed, patient examined, no change in status, stable for surgery.  I have reviewed the patient's chart and labs.  Questions were answered to the patient's satisfaction.     [name]

## 2015-11-06 NOTE — Telephone Encounter (Signed)
Valetta Fuller re-faxed this message to Dr. Ulyses Amor office on 11/04/15 after multiple failed attempts when I was last in the office on 11/03/15. Confirmation received.

## 2015-11-06 NOTE — H&P (View-Only) (Signed)
Patient ID: Antonio Hancock, male   DOB: 02/28/1941, 74 y.o.   MRN: [id number] CT abdomen and pelvis on 11/17 revealed 4 cm pancreatic head mass with biliary and pancreatic ductal dilatation as well as mild peripancreatic lymphadenopathy, stable 3 cm upper pole right renal lesion and stable 3.1 cm infrarenal AAA. Patient status post ERCP today noting extrinsic compression of common bile duct with a dilated duct. Patient currently denies significant abdominal pain, nausea/ vomiting. VSS;AF. Plan is for EUS with FNA by GI in the next 1-2 weeks. CA-19-9  67, total bilirubin 8.0, WBC 3.1, hemoglobin 10.1, platelet 217k. Right renal mass cryoablation will be put on hold until GI workup has been completed.

## 2015-11-07 ENCOUNTER — Ambulatory Visit (HOSPITAL_COMMUNITY)
Admission: RE | Admit: 2015-11-07 | Discharge: 2015-11-07 | Disposition: A | Discharge status: Home / Self Care | Payer: BLUE CROSS/BLUE SHIELD | Source: Ambulatory Visit | Attending: Gastroenterology | Admitting: Gastroenterology

## 2015-11-07 ENCOUNTER — Encounter (HOSPITAL_COMMUNITY)
Admission: RE | Disposition: A | Discharge status: Home / Self Care | Payer: Self-pay | Source: Ambulatory Visit | Attending: Gastroenterology

## 2015-11-07 ENCOUNTER — Ambulatory Visit (HOSPITAL_COMMUNITY): Payer: BLUE CROSS/BLUE SHIELD | Admitting: Anesthesiology

## 2015-11-07 ENCOUNTER — Ambulatory Visit (HOSPITAL_COMMUNITY): Payer: BLUE CROSS/BLUE SHIELD

## 2015-11-07 ENCOUNTER — Encounter (HOSPITAL_COMMUNITY): Payer: Self-pay | Admitting: Anesthesiology

## 2015-11-07 DIAGNOSIS — I10 Essential (primary) hypertension: Secondary | ICD-10-CM | POA: Diagnosis not present

## 2015-11-07 DIAGNOSIS — Z950 Presence of cardiac pacemaker: Secondary | ICD-10-CM | POA: Diagnosis not present

## 2015-11-07 DIAGNOSIS — K8689 Other specified diseases of pancreas: Secondary | ICD-10-CM | POA: Diagnosis not present

## 2015-11-07 DIAGNOSIS — Z79899 Other long term (current) drug therapy: Secondary | ICD-10-CM | POA: Insufficient documentation

## 2015-11-07 DIAGNOSIS — G4730 Sleep apnea, unspecified: Secondary | ICD-10-CM | POA: Diagnosis not present

## 2015-11-07 DIAGNOSIS — R17 Unspecified jaundice: Secondary | ICD-10-CM | POA: Insufficient documentation

## 2015-11-07 DIAGNOSIS — E785 Hyperlipidemia, unspecified: Secondary | ICD-10-CM | POA: Insufficient documentation

## 2015-11-07 DIAGNOSIS — Z7984 Long term (current) use of oral hypoglycemic drugs: Secondary | ICD-10-CM | POA: Insufficient documentation

## 2015-11-07 DIAGNOSIS — C250 Malignant neoplasm of head of pancreas: Secondary | ICD-10-CM | POA: Diagnosis not present

## 2015-11-07 DIAGNOSIS — F17210 Nicotine dependence, cigarettes, uncomplicated: Secondary | ICD-10-CM | POA: Insufficient documentation

## 2015-11-07 DIAGNOSIS — K831 Obstruction of bile duct: Secondary | ICD-10-CM

## 2015-11-07 DIAGNOSIS — E119 Type 2 diabetes mellitus without complications: Secondary | ICD-10-CM | POA: Insufficient documentation

## 2015-11-07 HISTORY — PX: FINE NEEDLE ASPIRATION: SHX5430

## 2015-11-07 HISTORY — PX: ERCP: SHX5425

## 2015-11-07 HISTORY — PX: EUS: SHX5427

## 2015-11-07 LAB — GLUCOSE, CAPILLARY
Glucose-Capillary: 80 mg/dL (ref 65–99)
Glucose-Capillary: 99 mg/dL (ref 65–99)

## 2015-11-07 SURGERY — ULTRASOUND, UPPER GI TRACT, ENDOSCOPIC
Anesthesia: General

## 2015-11-07 MED ORDER — ONDANSETRON HCL 4 MG/2ML IJ SOLN
INTRAMUSCULAR | Status: DC | PRN
  Administered 2015-11-07: 4 mg via INTRAVENOUS

## 2015-11-07 MED ORDER — SODIUM CHLORIDE 0.9 % IV SOLN: INTRAVENOUS | Status: DC

## 2015-11-07 MED ORDER — GLUCAGON HCL RDNA (DIAGNOSTIC) 1 MG IJ SOLR
INTRAMUSCULAR | Status: AC
  Filled 2015-11-07: qty 1

## 2015-11-07 MED ORDER — SODIUM CHLORIDE 0.9 % IV SOLN
INTRAVENOUS | Status: DC | PRN
  Administered 2015-11-07: 30 mL

## 2015-11-07 MED ORDER — PROPOFOL 10 MG/ML IV BOLUS
INTRAVENOUS | Status: DC | PRN
  Administered 2015-11-07: 150 mg via INTRAVENOUS

## 2015-11-07 MED ORDER — PHENYLEPHRINE 40 MCG/ML (10ML) SYRINGE FOR IV PUSH (FOR BLOOD PRESSURE SUPPORT)
PREFILLED_SYRINGE | INTRAVENOUS | Status: AC
  Filled 2015-11-07: qty 10

## 2015-11-07 MED ORDER — INDOMETHACIN 50 MG RE SUPP
RECTAL | Status: AC
  Filled 2015-11-07: qty 2

## 2015-11-07 MED ORDER — SUCCINYLCHOLINE CHLORIDE 20 MG/ML IJ SOLN
INTRAMUSCULAR | Status: DC | PRN
  Administered 2015-11-07: 80 mg via INTRAVENOUS

## 2015-11-07 MED ORDER — FENTANYL CITRATE (PF) 100 MCG/2ML IJ SOLN
INTRAMUSCULAR | Status: AC
  Filled 2015-11-07: qty 2

## 2015-11-07 MED ORDER — LACTATED RINGERS IV SOLN
INTRAVENOUS | Status: DC
  Administered 2015-11-07: 1000 mL via INTRAVENOUS

## 2015-11-07 MED ORDER — PROPOFOL 10 MG/ML IV BOLUS
INTRAVENOUS | Status: AC
  Filled 2015-11-07: qty 20

## 2015-11-07 MED ORDER — CIPROFLOXACIN IN D5W 400 MG/200ML IV SOLN
400.0000 mg | Freq: Once | INTRAVENOUS | Status: AC
  Administered 2015-11-07: 400 mg via INTRAVENOUS

## 2015-11-07 MED ORDER — CIPROFLOXACIN IN D5W 400 MG/200ML IV SOLN
INTRAVENOUS | Status: AC
  Filled 2015-11-07: qty 200

## 2015-11-07 MED ORDER — FENTANYL CITRATE (PF) 100 MCG/2ML IJ SOLN
INTRAMUSCULAR | Status: DC | PRN
  Administered 2015-11-07 (×2): 50 ug via INTRAVENOUS

## 2015-11-07 MED ORDER — CIPROFLOXACIN HCL 500 MG PO TABS
500.0000 mg | ORAL_TABLET | Freq: Two times a day (BID) | ORAL | Status: DC
  Filled 2015-11-07 (×3): qty 1

## 2015-11-07 MED ORDER — PHENYLEPHRINE HCL 10 MG/ML IJ SOLN
INTRAMUSCULAR | Status: DC | PRN
  Administered 2015-11-07 (×2): 80 ug via INTRAVENOUS
  Administered 2015-11-07: 40 ug via INTRAVENOUS
  Administered 2015-11-07: 80 ug via INTRAVENOUS
  Administered 2015-11-07: 40 ug via INTRAVENOUS

## 2015-11-07 SURGICAL SUPPLY — 1 items: Covered Biliary Stent ×3 IMPLANT

## 2015-11-07 NOTE — Anesthesia Postprocedure Evaluation (Signed)
Anesthesia Post Note  Patient: Antonio Hancock  Procedure(s) Performed: Procedure(s) (LRB): FULL UPPER ENDOSCOPIC ULTRASOUND (EUS) RADIAL (N/A) FINE NEEDLE ASPIRATION (FNA) LINEAR (N/A) ENDOSCOPIC RETROGRADE CHOLANGIOPANCREATOGRAPHY (ERCP) (N/A)  Patient location during evaluation: PACU Anesthesia Type: General Level of consciousness: awake and alert Pain management: pain level controlled Vital Signs Assessment: post-procedure vital signs reviewed and stable Respiratory status: spontaneous breathing Cardiovascular status: blood pressure returned to baseline Postop Assessment: no signs of nausea or vomiting Anesthetic complications: no    Last Vitals:  Filed Vitals:   11/07/15 1115 11/07/15 1120  BP: 108/39 103/46  Pulse: 62 62  Temp:    Resp: 15 13    Last Pain:  Filed Vitals:   11/07/15 1126  PainSc: 3                  Tiajuana Amass

## 2015-11-07 NOTE — Anesthesia Preprocedure Evaluation (Signed)
Anesthesia Evaluation  Patient identified by MRN, date of birth, ID band Patient awake    Reviewed: Allergy & Precautions, NPO status , Patient's Chart, lab work & pertinent test results  Airway Mallampati: II  TM Distance: >3 FB Neck ROM: Full    Dental no notable dental hx.    Pulmonary sleep apnea , Current Smoker,    Pulmonary exam normal breath sounds clear to auscultation       Cardiovascular hypertension, Pt. on medications Normal cardiovascular exam+ pacemaker  Rhythm:Regular Rate:Normal     Neuro/Psych negative neurological ROS  negative psych ROS   GI/Hepatic negative GI ROS, Neg liver ROS, Pancreatic mass   Endo/Other  diabetes, Type 2, Oral Hypoglycemic Agents  Renal/GU negative Renal ROS  negative genitourinary   Musculoskeletal negative musculoskeletal ROS (+)   Abdominal   Peds negative pediatric ROS (+)  Hematology negative hematology ROS (+)   Anesthesia Other Findings   Reproductive/Obstetrics negative OB ROS                             Lab Results  Component Value Date   WBC 3.1* 10/24/2015   HGB 10.1* 10/24/2015   HCT 28.2* 10/24/2015   MCV 86.0 10/24/2015   PLT 217 10/24/2015   Lab Results  Component Value Date   CREATININE 1.06 10/24/2015   BUN 12 10/24/2015   NA 127* 10/24/2015   K 4.0 10/24/2015   CL 97* 10/24/2015   CO2 23 10/24/2015    Anesthesia Physical  Anesthesia Plan  ASA: III  Anesthesia Plan: General   Post-op Pain Management:    Induction: Intravenous  Airway Management Planned: Oral ETT  Additional Equipment:   Intra-op Plan:   Post-operative Plan: Extubation in OR  Informed Consent: I have reviewed the patients History and Physical, chart, labs and discussed the procedure including the risks, benefits and alternatives for the proposed anesthesia with the patient or authorized representative who has indicated his/her  understanding and acceptance.   Dental advisory given  Plan Discussed with: CRNA  Anesthesia Plan Comments:         Anesthesia Quick Evaluation

## 2015-11-07 NOTE — H&P (View-Only) (Signed)
Reason for Consult: Biliary obstruction and pancreatic head mass Referring Physician: [facility]  Antonio Hancock HPI: This is a 74 year old male who is well-known to me for complaints of constipation, nausea, and vomiting.  Initially he required further evaluation of the right renal lesion, but his constipation precluded a clear view of the renal lesion for imaging.  Treatment was applied and his constipation resolved.  The colonoscopy was also negative for any luminal lesions.  Unfortunately he continued to have nausea.  A GES was performed, and it was negative.  I did not think that his symptoms were coming from his renal lesion, but I told him I would reassess after the cryoablation of the renal lesion.  Upon arrival for the cryoablation he was noted to be jaundiced.  Dr. Pascal Lux confirmed the hyperbilirubinemia and a repeat CT scan was performed.  Unlike the prior CT scans this summer, a new 3 cm pancreatic head mass was identified along with intrahepatic and extrahepatic biliary ductal dilation.  In retrospect he believes he started having dark urine over a month ago.  Also, he has lost weight and his diarrhea has worsened.  Past Medical History  Diagnosis Date  . Hypertension   . Diabetes mellitus     Diagnosed Fall 2012  . Mobitz type 2 second degree AV block     11/11/2011  . Tobacco abuse     1/2 ppd x 55 yrs  . Pacemaker 2012    Medtronic Adapta L pulse generator, serial number [id number]  . 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 Hyperlipidemia   . HOH (hard of hearing)   . Anxiety     Past Surgical History  Procedure Laterality Date  . Back surgery    . Permanent pacemaker insertion N/A 11/12/2011    Procedure: PERMANENT PACEMAKER INSERTION;  Surgeon: Deboraha Sprang, MD;  Location: [facility] CATH LAB;  Service: Cardiovascular;  Laterality: Left; Medtronic Adapta L pulse generator, serial number [id number]    . Hernia repair  1996   left inguinal hernia repair     Family History  Problem Relation Age of Onset  . Brain cancer Mother     deceased 36  . Prostate cancer Father     deceased 14  . Deep vein thrombosis Father   . Hypertension Father   . Diabetes Father   . Heart attack Neg Hx   . Hypertension Sister   . Hypertension Brother   . Cancer Father     32  . Cancer Mother     37    Social History:  reports that he has been smoking Cigarettes.  He has a 50 pack-year smoking history. He has never used smokeless tobacco. He reports that he does not drink alcohol or use illicit drugs.  Allergies: No Known Allergies  Medications:  Scheduled: . amLODipine  5 mg Oral Daily  . apixaban  5 mg Oral BID  . atorvastatin  10 mg Oral Daily  . irbesartan  150 mg Oral Daily  . metFORMIN  500 mg Oral Q supper  . multivitamin with minerals  1 tablet Oral Daily  . pantoprazole  40 mg Oral Daily   Continuous: . sodium chloride    . sodium chloride      Results for orders placed or performed during the hospital encounter of 10/23/15 (from the past 24 hour(s))  Protime-INR     Status: None   Collection Time: 10/23/15  7:54 AM  Result Value Ref Range   Prothrombin Time 14.0 11.6 - 15.2 seconds   INR 1.06 0.00 - 1.49  Comprehensive metabolic panel     Status: Abnormal   Collection Time: 10/23/15  7:54 AM  Result Value Ref Range   Sodium 128 (L) 135 - 145 mmol/L   Potassium 4.1 3.5 - 5.1 mmol/L   Chloride 97 (L) 101 - 111 mmol/L   CO2 21 (L) 22 - 32 mmol/L   Glucose, Bld 108 (H) 65 - 99 mg/dL   BUN 13 6 - 20 mg/dL   Creatinine, Ser 1.01 0.61 - 1.24 mg/dL   Calcium 9.7 8.9 - 10.3 mg/dL   Total Protein 7.6 6.5 - 8.1 g/dL   Albumin 3.4 (L) 3.5 - 5.0 g/dL   AST 139 (H) 15 - 41 U/L   ALT 179 (H) 17 - 63 U/L   Alkaline Phosphatase 591 (H) 38 - 126 U/L   Total Bilirubin 9.1 (H) 0.3 - 1.2 mg/dL   GFR calc non Af Amer >60 >60 mL/min   GFR calc Af Amer >60 >60 mL/min   Anion gap 10 5 - 15  Amylase     Status:  None   Collection Time: 10/23/15  7:54 AM  Result Value Ref Range   Amylase 71 28 - 100 U/L  Lipase, blood     Status: Abnormal   Collection Time: 10/23/15  7:54 AM  Result Value Ref Range   Lipase 65 (H) 11 - 51 U/L  Glucose, capillary     Status: None   Collection Time: 10/23/15  8:12 AM  Result Value Ref Range   Glucose-Capillary 98 65 - 99 mg/dL   Comment 1 Notify RN      Ct Abd Wo & W Cm  10/23/2015  CLINICAL DATA:  Right upper quadrant abdominal pain and nausea for 5 months. Jaundice. EXAM: CT ABDOMEN WITHOUT AND WITH CONTRAST TECHNIQUE: Multidetector CT imaging of the abdomen was performed following the standard protocol before and following the bolus administration of intravenous contrast. CONTRAST:  116mL OMNIPAQUE IOHEXOL 300 MG/ML  SOLN COMPARISON:  06/11/2015 FINDINGS: Lower chest: No acute findings. Hepatobiliary: No liver masses identified. Gallbladder is distended but otherwise unremarkable in appearance. Diffuse intra and extrahepatic biliary ductal dilatation is seen with common bile duct measuring 17 mm in diameter. Pancreas: Pancreatic ductal dilatation seen throughout the body and tail. Soft tissue mass seen in the pancreatic head measuring 3.0 x 4.1 cm on image 49/series 3, highly suspicious for pancreatic carcinoma. Spleen:  Within normal limits in size and appearance. Adrenal Glands:  No masses identified. Kidneys: Subcapsular lesion in the upper pole of the right kidney remains stable in size, measuring 3.0 cm on image 27/series 5. This continues to show hyper density precontrast, with increase in attenuation values from 34 Hounsfield units precontrast to 47 Hounsfield units postcontrast, which is considered equivocal for contrast enhancement. No evidence of hydronephrosis. Stomach/Bowel/Peritoneum: No evidence of wall thickening, mass, or obstruction involving visualized abdominal bowel. Vascular/Lymphatic: The pancreatic head mass appears to involve the superior mesenteric  vein and portal venous confluence. There is no evidence of portal vein thrombus. There is image degradation from streak artifact from spinal fixation rods, however there is mild peripancreatic lymphadenopathy, with largest lymph node measuring 1.5 cm on image 26/series 5. 3.1 cm infrarenal abdominal aortic aneurysm shows no significant change. Other:  None. Musculoskeletal:  No suspicious bone lesions identified. IMPRESSION: 4 cm pancreatic head mass causing diffuse biliary and  pancreatic ductal dilatation. This is highly suspicious for primary pancreatic carcinoma. This mass appears to involve the superior mesenteric vein and portal venous confluence. Mild peripancreatic lymphadenopathy, suspicious for metastatic disease. Stable 3 cm indeterminate lesion in upper pole of right kidney, showing equivocal contrast enhancement. Differential diagnosis remains complex renal cyst versus renal neoplasm. Stable 3.1 cm infrarenal abdominal aortic aneurysm. Electronically Signed   By: Earle Gell M.D.   On: 10/23/2015 11:12    ROS:  As stated above in the HPI otherwise negative.  Blood pressure 129/61, pulse 63, temperature 98.2 F (36.8 C), temperature source Oral, resp. rate 18, height 5\' 11"  (1.803 m), weight 77.111 kg (170 lb), SpO2 100 %.    PE: Gen: NAD, Alert and Oriented, jaundice HEENT:  [place]/AT, EOMI Neck: Supple, no LAD Lungs: CTA Bilaterally CV: RRR without M/G/R ABM: Soft, NTND, +BS Ext: No C/C/E  Assessment/Plan: 1) Pancreatic head mass. 2) Biliary obstruction. 3) Jaundice.   I had a very long discussion with the patient and his family.  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  My first and foremost goal is to place a stent to allow for drainage.  He can be discharged home after the stent placement the following day to ensure no complications from the procedure.  I will start him on Cipro 400 mg IV  Q12 hours for prophylaxis and using Creon will be helpful.  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  Plan: 1) ERCP tomorrow with stent placment. 2) EUS/FNA in the near future. 3) Cirpo 400 mg IV q12 hours.  Shaneka Efaw D 10/23/2015, 1:38 PM

## 2015-11-07 NOTE — Discharge Instructions (Signed)
General Anesthesia, Adult, Care After °Refer to this sheet in the next few weeks. These instructions provide you with information on caring for yourself after your procedure. Your health care provider may also give you more specific instructions. Your treatment has been planned according to current medical practices, but problems sometimes occur. Call your health care provider if you have any problems or questions after your procedure. °WHAT TO EXPECT AFTER THE PROCEDURE °After the procedure, it is typical to experience: °· Sleepiness. °· Nausea and vomiting. °HOME CARE INSTRUCTIONS °· For the first 24 hours after general anesthesia: °¨ Have a responsible person with you. °¨ Do not drive a car. If you are alone, do not take public transportation. °¨ Do not drink alcohol. °¨ Do not take medicine that has not been prescribed by your health care provider. °¨ Do not sign important papers or make important decisions. °¨ You may resume a normal diet and activities as directed by your health care provider. °· Change bandages (dressings) as directed. °· If you have questions or problems that seem related to general anesthesia, call the hospital and ask for the anesthetist or anesthesiologist on call. °SEEK MEDICAL CARE IF: °· You have nausea and vomiting that continue the day after anesthesia. °· You develop a rash. °SEEK IMMEDIATE MEDICAL CARE IF:  °· You have difficulty breathing. °· You have chest pain. °· You have any allergic problems. °  °This information is not intended to replace advice given to you by your health care provider. Make sure you discuss any questions you have with your health care provider. °  °Document Released: 02/28/2001 Document Revised: 12/13/2014 Document Reviewed: 03/22/2012 °Elsevier Interactive Patient Education ©2016 [employer] °Endoscopic Retrograde Cholangiopancreatography (ERCP), Care After °Refer to this sheet in the next few weeks. These instructions provide you with information on  caring for yourself after your procedure. Your health care provider may also give you more specific instructions. Your treatment has been planned according to current medical practices, but problems sometimes occur. Call your health care provider if you have any problems or questions after your procedure.  °WHAT TO EXPECT AFTER THE PROCEDURE  °After your procedure, it is typical to feel:  °· Soreness in your throat.   °· Sick to your stomach (nauseous).   °· Bloated. °· Dizzy.   °· Fatigued. °HOME CARE INSTRUCTIONS °· Have a friend or family member stay with you for the first 24 hours after your procedure. °· Start taking your usual medicines and eating normally as soon as you feel well enough to do so or as directed by your health care provider. °SEEK MEDICAL CARE IF: °· You have abdominal pain.   °· You develop signs of infection, such as:   °¨ Chills.   °¨ Feeling unwell.   °SEEK IMMEDIATE MEDICAL CARE IF: °· You have difficulty swallowing. °· You have worsening throat, chest, or abdominal pain. °· You vomit. °· You have bloody or very black stools. °· You have a fever. °  °This information is not intended to replace advice given to you by your health care provider. Make sure you discuss any questions you have with your health care provider. °  °Document Released: 09/12/2013 Document Reviewed: 09/12/2013 °Elsevier Interactive Patient Education ©2016 [employer] ° °

## 2015-11-07 NOTE — Anesthesia Procedure Notes (Signed)
Procedure Name: Intubation Date/Time: 11/07/2015 9:09 AM Performed by: Anne Fu Pre-anesthesia Checklist: Patient identified, Emergency Drugs available, Suction available, Patient being monitored and Timeout performed Patient Re-evaluated:Patient Re-evaluated prior to inductionOxygen Delivery Method: Circle system utilized Preoxygenation: Pre-oxygenation with 100% oxygen Intubation Type: IV induction Ventilation: Mask ventilation without difficulty Laryngoscope Size: Mac and 4 Grade View: Grade I Tube type: Oral Tube size: 7.5 mm Number of attempts: 1 Airway Equipment and Method: Stylet Placement Confirmation: ETT inserted through vocal cords under direct vision,  positive ETCO2,  CO2 detector and breath sounds checked- equal and bilateral Secured at: 20 cm Tube secured with: Tape Dental Injury: Teeth and Oropharynx as per pre-operative assessment

## 2015-11-07 NOTE — Transfer of Care (Signed)
Immediate Anesthesia Transfer of Care Note  Patient: Antonio Hancock  Procedure(s) Performed: Procedure(s): FULL UPPER ENDOSCOPIC ULTRASOUND (EUS) RADIAL (N/A) FINE NEEDLE ASPIRATION (FNA) LINEAR (N/A) ENDOSCOPIC RETROGRADE CHOLANGIOPANCREATOGRAPHY (ERCP) (N/A)  Patient Location: PACU  Anesthesia Type:General  Level of Consciousness:  sedated, patient cooperative and responds to stimulation  Airway & Oxygen Therapy:Patient Spontanous Breathing and Patient connected to face mask oxgen  Post-op Assessment:  Report given to PACU RN and Post -op Vital signs reviewed and stable  Post vital signs:  Reviewed and stable  Last Vitals:  Filed Vitals:   11/07/15 0747 11/07/15 1054  BP: 110/46 112/43  Pulse: 87 69  Temp: 36.8 C 36.5 C  Resp: 18 15    Complications: No apparent anesthesia complications

## 2015-11-07 NOTE — Op Note (Signed)
[facility] [place], [address]   UPPER ENDOSCOPIC ULTRASOUND PROCEDURE REPORT     EXAM DATE: 11/07/2015  PATIENT NAME:          Antonio Hancock, Antonio Hancock          MR#: [id number] BIRTHDATE:       May 03, 1941     VISIT #:     [id number] ATTENDING:     Carol Ada, MD     STATUS:     outpatient ASSISTANT:      Cletis Athens and Laverta Baltimore REFERRING MD: ASA CLASS:        Class III  INDICATIONS:  The patient is a 74 yr old male here for a lower endoscopic ultrasound due to pancreatic head mass. PROCEDURE PERFORMED:     Upper EUS and ERCP with stent placement   MEDICATIONS:     General Anesthesia  CONSENT: The patient understands the risks and benefits of the procedure and understands that these risks include, but are not limited to: sedation, allergic reaction, infection, perforation and/or bleeding. Alternative means of evaluation and treatment include, among others: physical exam, x-rays, and/or surgical intervention. The patient elects to proceed with this endoscopic procedure.  DESCRIPTION OF PROCEDURE: During intra-op preparation period all mechanical & medical equipment was checked for proper function. Hand hygiene and appropriate measures for infection prevention was taken. After the risks, benefits and alternatives of the procedure were thoroughly explained, Informed consent was verified, confirmed and timeout was successfully executed by the treatment team. The patient was then placed in the left, lateral, decubitus position and IV sedation was administered. Throughout the procedure, the patients blood pressure, pulse and oxygen saturations were monitored continuously. Under direct visualization, the    was introduced through the mouth and advanced to the second portion of the duodenum.  Water was used as necessary to provide an acoustic interface. The pulse, BP, and O2 saturation were monitored and documented by the physician and  nursing staff throughout the procedure. Upon completion of the imaging, water was removed and the patient was then discharged to recovery in stable condition with the appropriate post procedure care. Estimated blood loss is zero unless otherwise noted in this procedure report.   PANCREAS:  An irregularly bordered and very ill-defined hypoechoic pancreatic head mass was identifed.  The mass measured between 1.8 - 2.5 cm.  The PV was not involved with the mass and the celiac axis was negative for any overt evidence of LAD.  In fact, no other LAD was noted.  The CBD was marekdly dilated at 2 cm and there was material floating in the CBD.  Right hepatic biliary ductal dilation was also identified.  The CBD stent was noted to be in position and the proximal end of the stent was above the proximal end of the mass.  Five passes with the 25 gauge FNA needle was performed with reportedly good sampling.  After the EUS/FNA, the procedure was converted to an ERCP.  The guidewire was inserted through the stent and it appeared clogged.  Once the guidewire was secured in the right intrahepatic ducts a copious amount of pus drained from the stent.  Attempts to place the wire along side of the stent failed.  With care, the guidewire was again inserted through the stent and then the stent was removed over the guidewire with a snare.  A 10 cm x 6 cm stent was successfully inserted. After final deployment a massive amount of pus drained.  The stent was cannulated with the sphincterotome and contrast injection revealed excellent placement of the stent across the stricture. The contrast rapidly drained.    ADVERSE EVENTS:     There were no immediate complications. IMPRESSIONS:     1) Pancreatic head mass with CBD impingement.   RECOMMENDATIONS:     1) Follow up in the office on Monday. 2) Ciprofloxacin 500 mg BID x 10 days.  ___________________________________ Carol Ada, MD eSigned:  Carol Ada, MD 11/07/2015 11:12 AM   cc:      PATIENT NAME:  Antonio Hancock, Antonio Hancock MR#: [id number]

## 2015-11-07 NOTE — Interval H&P Note (Signed)
History and Physical Interval Note:  11/07/2015 8:24 AM  Antonio Hancock  has presented today for surgery, with the diagnosis of pancreatic mass  The various methods of treatment have been discussed with the patient and family. After consideration of risks, benefits and other options for treatment, the patient has consented to  Procedure(s): FULL UPPER ENDOSCOPIC ULTRASOUND (EUS) RADIAL (N/A) FINE NEEDLE ASPIRATION (FNA) LINEAR (N/A) ENDOSCOPIC RETROGRADE CHOLANGIOPANCREATOGRAPHY (ERCP) (N/A) as a surgical intervention .  The patient's history has been reviewed, patient examined, no change in status, stable for surgery.  I have reviewed the patient's chart and labs.  Questions were answered to the patient's satisfaction.     Kye Silverstein D

## 2015-11-10 ENCOUNTER — Encounter (HOSPITAL_COMMUNITY): Payer: Self-pay | Admitting: Gastroenterology

## 2015-11-13 ENCOUNTER — Other Ambulatory Visit: Payer: Self-pay | Admitting: Gastroenterology

## 2015-11-13 DIAGNOSIS — C250 Malignant neoplasm of head of pancreas: Secondary | ICD-10-CM

## 2015-11-14 ENCOUNTER — Other Ambulatory Visit: Payer: Self-pay | Admitting: Gastroenterology

## 2015-11-14 ENCOUNTER — Ambulatory Visit
Admission: RE | Admit: 2015-11-14 | Discharge: 2015-11-14 | Disposition: A | Discharge status: Home / Self Care | Payer: BLUE CROSS/BLUE SHIELD | Source: Ambulatory Visit | Attending: Gastroenterology | Admitting: Gastroenterology

## 2015-11-14 DIAGNOSIS — C250 Malignant neoplasm of head of pancreas: Secondary | ICD-10-CM

## 2015-11-19 ENCOUNTER — Telehealth: Payer: Self-pay | Admitting: *Deleted

## 2015-11-19 NOTE — Telephone Encounter (Signed)
  Oncology Nurse Navigator Documentation  Referral date to RadOnc/MedOnc: 11/19/15 (11/19/15 1623) Navigator Encounter Type: Introductory phone call (11/19/15 1623): Received referral from Dr. Benson Norway for medical oncology. Attempted to give patient appointment on 12/21 at 11:00 with Dr. Alen Blew. He was not comfortable with this, reporting his wife had spoken with someone about an appointment this Friday with a "male cancer doctor". Inquired if he was going to [facility] or [place] said [place]. He will have his wife call nurse navigator on Friday to discuss his appointment. Provided my name and direct phone #.

## 2015-11-20 ENCOUNTER — Telehealth: Payer: Self-pay | Admitting: *Deleted

## 2015-11-20 NOTE — Telephone Encounter (Signed)
  Oncology Nurse Navigator Documentation    Navigator Encounter Type: Telephone (11/20/15 1500): Wife returned call to confirm the appointment with Dr. Alen Blew on 11/26/15 at 11:00. Informed of location of [facility], valet service, and registration process. Reminded to bring insurance cards and a current medication list, including supplements. Wife verbalizes understanding. Welcome Packet mailed to home.

## 2015-11-21 ENCOUNTER — Encounter: Payer: Self-pay | Admitting: *Deleted

## 2015-11-21 ENCOUNTER — Other Ambulatory Visit (HOSPITAL_COMMUNITY): Payer: Self-pay | Admitting: General Surgery

## 2015-11-21 ENCOUNTER — Ambulatory Visit: Payer: BLUE CROSS/BLUE SHIELD | Admitting: Nutrition

## 2015-11-21 ENCOUNTER — Encounter: Payer: BLUE CROSS/BLUE SHIELD | Admitting: Internal Medicine

## 2015-11-21 ENCOUNTER — Other Ambulatory Visit: Payer: Self-pay | Admitting: General Surgery

## 2015-11-21 DIAGNOSIS — C250 Malignant neoplasm of head of pancreas: Secondary | ICD-10-CM

## 2015-11-21 NOTE — Progress Notes (Addendum)
74 year old male diagnosed with cancer of the pancreas.  He is a patient of Dr. Alen Blew.  Past medical history includes hypertension, diabetes, tobacco, pacemaker, hyperlipidemia, anxiety, and hard of hearing.  Medications include Zofran, Xanax, Lipitor, Creon, Glucophage, multivitamin, and Protonix.  Labs include glucose 99.  Height: 5 feet 11 inches. Weight: 159 pounds on December 2.  Patient reports he weighed 152 pounds today and M.D. Office. Usual body weight: 198-200 pounds. BMI: 22.19.  Patient reports severe nausea and vomiting.  States he is unable to eat anything or drink anything without throwing up Patient reports he was given new nausea medicine today.  He has not yet picked up the prescription. Reports very poor appetite and taste alterations. Patient states he did not tolerate ensure and does not want to try supplements today. Patient states he may have radiation therapy and surgery. Understands he needs to gain weight.  Nutrition diagnosis:  Severe malnutrition secondary to inadequate oral intake with new diagnosis of pancreas cancer as evidenced by 20% weight loss from usual body weight and depletion of fat and muscle stores.  Intervention:  I educated patient on importance of taking new nausea medication as prescribed. Reviewed diet for nausea and vomiting and encouraged bland, salty foods in very small amounts every hour to 2 hours. Encouraged increased hydration. Provided additional samples of oral nutrition supplements and recommended patient begin these after nausea medicine is effective. Provided fact sheets and my contact information.  Questions were answered.  Teach back method was used.  Monitoring, evaluation, goals: Patient will tolerate increased calories and protein to minimize weight loss.  Next visit: Wednesday, December 21.  After M.D. Visit  .**Disclaimer: This note was dictated with voice recognition software. Similar sounding words can  inadvertently be transcribed and this note may contain transcription errors which may not have been corrected upon publication of note.**

## 2015-11-21 NOTE — Progress Notes (Signed)
Oncology Nurse Navigator Documentation  Oncology Nurse Navigator Flowsheets 11/21/2015  Referral date to RadOnc/MedOnc -  Navigator Encounter Type Telephone with Dr. Barry Dienes at [facility]  Barriers/Navigation Needs Family concerns--poor nutrition  Interventions Coordination of Care  Coordination of Care Other-spoke with Ernestene Kiel, nutritionist: will work him to be seen today at 2:15

## 2015-11-25 ENCOUNTER — Encounter (HOSPITAL_COMMUNITY): Payer: Self-pay

## 2015-11-25 ENCOUNTER — Ambulatory Visit (HOSPITAL_COMMUNITY): Payer: BLUE CROSS/BLUE SHIELD

## 2015-11-25 ENCOUNTER — Other Ambulatory Visit (HOSPITAL_COMMUNITY): Payer: Self-pay | Admitting: General Surgery

## 2015-11-25 ENCOUNTER — Ambulatory Visit (HOSPITAL_COMMUNITY)
Admission: RE | Admit: 2015-11-25 | Discharge: 2015-11-25 | Disposition: A | Discharge status: Home / Self Care | Payer: BLUE CROSS/BLUE SHIELD | Source: Ambulatory Visit | Attending: General Surgery | Admitting: General Surgery

## 2015-11-25 DIAGNOSIS — I700 Atherosclerosis of aorta: Secondary | ICD-10-CM | POA: Insufficient documentation

## 2015-11-25 DIAGNOSIS — C250 Malignant neoplasm of head of pancreas: Secondary | ICD-10-CM | POA: Diagnosis present

## 2015-11-25 DIAGNOSIS — I2510 Atherosclerotic heart disease of native coronary artery without angina pectoris: Secondary | ICD-10-CM | POA: Insufficient documentation

## 2015-11-25 MED ORDER — IOHEXOL 300 MG/ML  SOLN
75.0000 mL | Freq: Once | INTRAMUSCULAR | Status: AC | PRN
  Administered 2015-11-25: 75 mL via INTRAVENOUS

## 2015-11-26 ENCOUNTER — Encounter: Payer: Self-pay | Admitting: *Deleted

## 2015-11-26 ENCOUNTER — Other Ambulatory Visit: Payer: Self-pay | Admitting: *Deleted

## 2015-11-26 ENCOUNTER — Encounter: Payer: BLUE CROSS/BLUE SHIELD | Admitting: Nutrition

## 2015-11-26 ENCOUNTER — Ambulatory Visit (HOSPITAL_BASED_OUTPATIENT_CLINIC_OR_DEPARTMENT_OTHER): Payer: BLUE CROSS/BLUE SHIELD | Admitting: Oncology

## 2015-11-26 VITALS — BP 83/41 | HR 92 | Temp 97.5°F | Resp 16 | Ht 71.0 in | Wt 154.3 lb

## 2015-11-26 DIAGNOSIS — C250 Malignant neoplasm of head of pancreas: Secondary | ICD-10-CM

## 2015-11-26 DIAGNOSIS — C259 Malignant neoplasm of pancreas, unspecified: Secondary | ICD-10-CM

## 2015-11-26 DIAGNOSIS — R17 Unspecified jaundice: Secondary | ICD-10-CM | POA: Diagnosis not present

## 2015-11-26 DIAGNOSIS — I959 Hypotension, unspecified: Secondary | ICD-10-CM

## 2015-11-26 NOTE — Progress Notes (Addendum)
GI Location of Tumor / Histology: Pancreas  Antonio Hancock presented  months ago with symptoms of:  Right sided flank pain, abdominal pain, jaundice   Biopsies of  (if applicable) revealed: Diagnosis 11/07/2015 FINE NEEDLE ASPIRATION, ENDOSCOPIC, PANCREAS (SPECIMEN 1 OF 1 COLLECTED 11/07/15):MALIGNANT CELLS CONSISTENT WITH ADENOCARCINOMA  Past/Anticipated interventions by surgeon, if any:  Dr. Benson Norway,  EUS and ERCP, 11/07/15,  Biliarystent placed  10/24/15  Past/Anticipated interventions by medical oncology, if any: Dr. Alen Blew seen 11/26/15  Weight changes, if any: 46 lb loss   Bowel/Bladder complaints, if any: constipation,  Normal bowel movement this am, difficulty starting his urine,  Nocturia 4-5x  Nausea / Vomiting, if any: yes,  &anorexia  Pain issues, if any:  inetrmittent abdominal pain  Any blood per rectum:   NO  SAFETY ISSUES:yes, weak, ambulating short distances  Prior radiation?  NO  Pacemaker/ICD? YES   Is the patient on methotrexate? 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 , no smokeless tobacco, no alcohol or drug use ,HTN, Anxiety ,Back surgery,8 Mother Brain ca, deceased age 22, Prostate ca, deceased age 33, patient is jaundiced   BP 83/51 mmHg  Pulse 97  Temp(Src) 97.8 F (36.6 C) (Oral)  Resp 20  Ht 5\' 11"  (1.803 m)  Wt 154 lb 12.8 oz (70.217 kg)  BMI 21.60 kg/m2 sitting vitals BP 73/38 mmHg  Pulse 95  Temp(Src) 97.8 F (36.6 C) (Oral)  Resp 20  Ht 5\' 11"  (1.803 m)  Wt 154 lb 12.8 oz (70.217 kg)  BMI 21.60 kg/m2 standing vitals Wt Readings from Last 3 Encounters:  11/27/15 154 lb 12.8 oz (70.217 kg)  11/26/15 154 lb 4.8 oz (69.99 kg)  11/07/15 159 lb (72.122 kg)   Allergies:NKA  Very HOH wears hearing aids smokes 11-12 cigarettes daily, light headed  And dizzy took orthostatic , is taking blood pressure  medication

## 2015-11-26 NOTE — Progress Notes (Signed)
Please see consult note.  

## 2015-11-26 NOTE — Progress Notes (Signed)
Oncology Nurse Navigator Documentation  Oncology Nurse Navigator Flowsheets 11/26/2015  Referral date to RadOnc/MedOnc -  Navigator Encounter Type Initial MedOnc  Patient Visit Type Medonc  Treatment Phase Treatment planning  Barriers/Navigation Needs Family concerns;Education  Education Newly Diagnosed Cancer Education;Preparing for Upcoming Treatment;Other  Interventions Referrals;Coordination of Care  Referrals Social Work  [place]  Time Spent with Patient 42  Wife and patient have good understanding of treatment plan: radiation therapy M-F with oral Xeloda. Scheduled for chemo class on 12/16/15 when he sees Dr. Alen Blew. Will give him revised calendar on 12/22 visit with Dr. Lisbeth Renshaw. Wife concerned about if he needs assistance in the home later, since she needs to work. Will have CSW contact her with agencies in his area. [facility] will call wife to advise of any assistance they can provide. Will follow up on his Xeloda co pay.

## 2015-11-26 NOTE — Consult Note (Signed)
Reason for Referral: Pancreatic cancer.   HPI: 74 year old gentleman currently of [place], [place] where he lived the majority of his life. He is a gentleman with history of heavy tobacco use, hypertension, diabetes and cardiac arrhythmia. He was in his usual state of health until he started developing right-sided flank pain and his evaluation back in July 2016 including a CT scan of the abdomen which showed 3.0 x 2.7 cm exophytic mass in the anterior interpolar of the right kidney. He was evaluated by interventional radiology for possible ablation and underwent a preoperative cardiac evaluation for clearance purposes. He subsequently started developing abdominal pain, weight loss and subsequently jaundice. Prior to undergoing his ablation he was noted to have a bilirubin over 9 on 10/23/2015. Cryoablation was aborted at that time. Repeat a CT scan at that time showed a 4 cm pancreatic head mass causing diffuse periampullar Caddick ductal dilation suspicious for malignancy. His CA-19-9 at that time was up to 67. He was subsequently evaluated by Dr. Benson Norway and underwent an EUS and ERCP which showed the pancreatic mass does not have any local vascular invasion and no lymphadenopathy detected. Stent was placed at that time on 11/07/2015. 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 On 11/12/2015 his bilirubin was 10.2 but sounds appear to be declining. The rest of his staging workup did not reveal any evidence of metastatic disease including CT scan of the chest.  He was evaluated by Dr. Barry Dienes last week and felt that he might not be a surgical candidate given to his overall poor health and weight loss. Clinically, he have reported rather continuous decline in the last 4 months. He reported above 46 pound weight loss and intermittent chronic abdominal pain. He also reported some nausea and poor by mouth intake. He did also reports some constipation. His performance status is limited and is not been  able to drive as of late. He has poor hearing and dependent on his wife for most activities of daily living. Has not reported any falls or syncope and still ambulating short distances.  He does not report any headaches, blurry vision, syncope or seizures. He does not report any fevers, chills, sweats does report weight loss of appetite changes as mentioned. Does not report any chest pain, palpitation, orthopnea, leg edema. Does not report any cough, wheezing or hemoptysis. Does not report any hematochezia, melena but does report early satiety. Does not report any frequency, urgency or hesitancy. He does report discoloration of the color of his urine. He does not report any skeletal plane such as arthralgias or myalgias. Does not report any lymphadenopathy or petechiae. Remaining review of systems unremarkable.   Past Medical History  Diagnosis Date  . Hypertension   . Diabetes mellitus     Diagnosed Fall 2012  . Mobitz type 2 second degree AV block     11/11/2011  . Tobacco abuse     1/2 ppd x 55 yrs  . Pacemaker 2012    Medtronic Adapta L pulse generator, serial number [id number]  . Chronotropic incompetence with sinus node dysfunction ([place]) 2012    s/p MDT PPM  . Hyperlipidemia   . HOH (hard of hearing)   . Anxiety   :  Past Surgical History  Procedure Laterality Date  . Back surgery    . Permanent pacemaker insertion N/A 11/12/2011    Procedure: PERMANENT PACEMAKER INSERTION;  Surgeon: Deboraha Sprang, MD;  Location: [facility] LAB;  Service: Cardiovascular;  Laterality: Left; Medtronic Adapta L  pulse generator, serial number [id number]    . Hernia repair  1996    left inguinal hernia repair   . Ercp N/A 10/24/2015    Procedure: ENDOSCOPIC RETROGRADE CHOLANGIOPANCREATOGRAPHY (ERCP);  Surgeon: Carol Ada, MD;  Location: [facility];  Service: Endoscopy;  Laterality: N/A;  . Biliary stent placement N/A 10/24/2015    Procedure: BILIARY STENT PLACEMENT;  Surgeon: Carol Ada, MD;   Location: [facility] ENDOSCOPY;  Service: Endoscopy;  Laterality: N/A;  . Eus N/A 11/07/2015    Procedure: FULL UPPER ENDOSCOPIC ULTRASOUND (EUS) RADIAL;  Surgeon: Carol Ada, MD;  Location: [facility] ENDOSCOPY;  Service: Endoscopy;  Laterality: N/A;  . Fine needle aspiration N/A 11/07/2015    Procedure: FINE NEEDLE ASPIRATION (FNA) LINEAR;  Surgeon: Carol Ada, MD;  Location: [facility] ENDOSCOPY;  Service: Endoscopy;  Laterality: N/A;  . Ercp N/A 11/07/2015    Procedure: ENDOSCOPIC RETROGRADE CHOLANGIOPANCREATOGRAPHY (ERCP);  Surgeon: Carol Ada, MD;  Location: [facility];  Service: Endoscopy;  Laterality: N/A;  :   Current outpatient prescriptions:  .  ALPRAZolam (XANAX) 0.25 MG tablet, Take 0.25 mg by mouth at bedtime. , Disp: , Rfl:  .  amLODipine (NORVASC) 5 MG tablet, TAKE 1 TABLET DAILY, Disp: , Rfl: 3 .  apixaban (ELIQUIS) 5 MG TABS tablet, Take 1 tablet (5 mg total) by mouth 2 (two) times daily., Disp: 60 tablet, Rfl: 5 .  atorvastatin (LIPITOR) 10 MG tablet, Take 10 mg by mouth daily., Disp: , Rfl:  .  lipase/protease/amylase (CREON) 36000 UNITS CPEP capsule, Take 1 capsule (36,000 Units total) by mouth 3 (three) times daily with meals., Disp: 270 capsule, Rfl: 1 .  metFORMIN (GLUCOPHAGE-XR) 500 MG 24 hr tablet, Take 500 mg by mouth daily with breakfast. , Disp: , Rfl:  .  morphine (MS CONTIN) 30 MG 12 hr tablet, Take 30 mg by mouth every 12 (twelve) hours., Disp: , Rfl: 0 .  Multiple Vitamins-Minerals (MULTIVITAMINS THER. W/MINERALS) TABS, Take 1 tablet by mouth daily.  , Disp: , Rfl:  .  olmesartan (BENICAR) 40 MG tablet, Take 40 mg by mouth daily., Disp: , Rfl:  .  oxyCODONE (OXY IR/ROXICODONE) 5 MG immediate release tablet, Take 1 tablet (5 mg total) by mouth every 4 (four) hours as needed for moderate pain., Disp: 45 tablet, Rfl: 0 .  pantoprazole (PROTONIX) 40 MG tablet, Take 40 mg by mouth daily., Disp: , Rfl:  .  promethazine (PHENERGAN) 12.5 MG tablet, Take 12.5 mg by mouth every 6 (six)  hours as needed., Disp: , Rfl: 1:  No Known Allergies:  Family History  Problem Relation Age of Onset  . Brain cancer Mother     deceased 25  . Prostate cancer Father     deceased 82  . Deep vein thrombosis Father   . Hypertension Father   . Diabetes Father   . Heart attack Neg Hx   . Hypertension Sister   . Hypertension Brother   . Cancer Father     47  . Cancer Mother     89  :  Social History   Social History  . Marital Status: Married    Spouse Name: N/A  . Number of Children: N/A  . Years of Education: N/A   Occupational History  . Not on file.   Social History Main Topics  . Smoking status: Current Every Day Smoker -- 1.00 packs/day for 50 years    Types: Cigarettes  . Smokeless tobacco: Never Used  . Alcohol Use: No  . Drug  Use: No  . Sexual Activity: No   Other Topics Concern  . Not on file   Social History Narrative   Gambles on the Internet  :  Pertinent items are noted in HPI.  Exam: Blood pressure 83/41, pulse 92, temperature 97.5 F (36.4 C), temperature source Oral, resp. rate 16, height 5\' 11"  (1.803 m), weight 154 lb 4.8 oz (69.99 kg), SpO2 100 %. General appearance: alert and cooperative. Cachectic-appearing gentleman without distress today. Head: Normocephalic, without obvious abnormality. Sclerae are mildly icteric. Throat: lips, mucosa, and tongue normal; teeth and gums normal oral ulcers or lesions. Neck: no adenopathy Back: negative no tenderness upon palpation or inspection. Resp: clear to auscultation bilaterally no wheezes or dullness to percussion. Chest wall: no tenderness no pain upon palpation. Cardio: regular rate and rhythm, S1, S2 normal, no murmur, click, rub or gallop no leg edema. GI: soft; bowel sounds normal; no masses,  no organomegaly no shifting dullness or ascites. Tender on deep palpation. Extremities: extremities normal, atraumatic Pulses: 2+ and symmetric Skin: Skin color, texture, turgor normal. No rashes or  lesions Lymph nodes: Cervical, supraclavicular, and axillary nodes normal.  CBC    Component Value Date/Time   WBC 3.1* 10/24/2015 0414   RBC 3.28* 10/24/2015 0414   HGB 10.1* 10/24/2015 0414   HCT 28.2* 10/24/2015 0414   PLT 217 10/24/2015 0414   MCV 86.0 10/24/2015 0414   MCH 30.8 10/24/2015 0414   MCHC 35.8 10/24/2015 0414   RDW 16.3* 10/24/2015 0414   LYMPHSABS 0.5* 10/16/2015 0910   MONOABS 0.7 10/16/2015 0910   EOSABS 0.2 10/16/2015 0910   BASOSABS 0.0 10/16/2015 0910      Chemistry      Component Value Date/Time   NA 127* 10/24/2015 0414   K 4.0 10/24/2015 0414   CL 97* 10/24/2015 0414   CO2 23 10/24/2015 0414   BUN 12 10/24/2015 0414   CREATININE 1.06 10/24/2015 0414      Component Value Date/Time   CALCIUM 9.1 10/24/2015 0414   ALKPHOS 486* 10/24/2015 0414   AST 110* 10/24/2015 0414   ALT 143* 10/24/2015 0414   BILITOT 8.0* 10/24/2015 0414       Ct Abdomen Wo Contrast  11/14/2015  CLINICAL DATA:  Evaluate stent placement. Patient has pancreatic cancer. EXAM: CT ABDOMEN WITHOUT CONTRAST TECHNIQUE: Multidetector CT imaging of the abdomen was performed following the standard protocol without IV contrast. COMPARISON:  CT abdomen 10/23/2015 FINDINGS: A biliary stent has been placed across the pancreatic mass. Pneumobilia is present. Improved intrahepatic ductal dilatation. Mild perihepatic ascites. Moderate gallbladder distention. No significant change in pancreatic head mass. Mildly prominent small bowel loops suggesting ascites. Previous lumbar instrumentation for T12 compression fracture. Pacemaker. No lung base nodules. IMPRESSION: Satisfactory biliary stent placement. Improved intrahepatic biliary ductal dilatation. Pneumobilia. Electronically Signed   By: Staci Righter M.D.   On: 11/14/2015 15:57   Ct Chest W Contrast  11/25/2015  CLINICAL DATA:  Pancreatic cancer diagnosed November, 2016. Staging examination. EXAM: CT CHEST WITH CONTRAST TECHNIQUE:  Multidetector CT imaging of the chest was performed during intravenous contrast administration. CONTRAST:  75 mL OMNIPAQUE IOHEXOL 300 MG/ML  SOLN COMPARISON:  CT abdomen 10/23/2015. Scratch the CT abdomen 11/14/2015 10/23/2015. FINDINGS: There is no axillary, hilar or mediastinal lymphadenopathy. Calcific aortic and coronary atherosclerosis is identified. No pleural or pericardial effusion. No pulmonary nodule, mass or consolidative process is identified. A small focus of linear atelectasis or scar is seen in the left lung base. Visualized upper  abdomen demonstrates pneumobilia with some air in the gallbladder noted. The patient's common bile duct stent is incompletely visualized. The visualized pancreas appears somewhat atrophic with a prominent pancreatic duct. Small volume of perihepatic ascites is noted. No lytic or sclerotic bony lesion is seen. Thoracolumbar fusion hardware for T12 compression fracture is partially visualized. IMPRESSION: Negative for metastatic disease to the chest. Calcific aortic and coronary atherosclerosis. Electronically Signed   By: Inge Rise M.D.   On: 11/25/2015 15:22   Dg Ercp Biliary & Pancreatic Ducts  11/07/2015  CLINICAL DATA:  Clogged common bile duct stent. EXAM: ERCP TECHNIQUE: Multiple spot images obtained with the fluoroscopic device and submitted for interpretation post-procedure. FLUOROSCOPY TIME:  If the device does not provide the exposure index: Fluoroscopy Time:  4 minutes and 6 seconds Number of Acquired Images:  5 COMPARISON:  10/24/2015 FINDINGS: Common bile duct was cannulated. Wire advanced into the intrahepatic biliary system. A metallic biliary stent was placed. There is contrast in the metallic biliary stent and it appears to be patent. IMPRESSION: Placement of a metallic biliary stent. These images were submitted for radiologic interpretation only. Please see the procedural report for the amount of contrast and the fluoroscopy time utilized.  Electronically Signed   By: Markus Daft M.D.   On: 11/07/2015 12:19    Assessment and Plan:   74 year old gentleman with the following issues:  1. Pancreatic cancer diagnosed in November 2016 after presenting with a 4 cm mass at the head of the pancreas with pancreatic ductal dilation. He underwent ERCP as well as EUS/FNA which showed no evidence of vascular invasion or lymphadenopathy and biopsy-proven to be adenocarcinoma with a CA-19-9 of 67. Rest of history workup did not reveal any metastatic disease to the liver or the lung. He has been evaluated for surgical resection and felt that he might be a borderline candidate.  The natural course of disease was discussed with the patient and his wife as well as treatment options. Although his tumor appears to be resectable he might not be a great candidate for a Whipple procedure given his age, poor health and cardiac status. I would be reasonable to consider alternative options in this setting an attempt to shrink the tumor further and potentially consider surgical resection of a later date.  Different options to be used in the neoadjuvant setting were reviewed today including systemic chemotherapy alone, chemoradiation and stereotactic body radiotherapy. The rationale of using these agents were reviewed today. The risks and benefits as well as discussed. I do not think he is a candidate for multi-agent systemic chemotherapy utilizing 5-FU, oxaliplatin and CPT-11. I do not think he is a candidate for gemcitabine and Abraxane combination. He is very hesitant to proceed with any chemotherapy at this time as he feels that he already falling poorly in any case.  I discussed with him alternatively to use chemoradiation as a modality for a borderline resectable pancreatic cancer. He will certainly be a reasonable candidate to try oral Xeloda with radiation therapy and subsequently use single agent gemcitabine for systemic therapy if surgical resection is no  longer an option. He is in favor of the spine at this time and I will refer him to radiation oncology for an opinion.  Risks and benefits of Xeloda oral chemotherapy were reviewed. Written information was given to the patient. I anticipate using this medication concomitantly with radiation therapy if he is felt to be radiation therapy candidate. The start of this therapy will be dictated by  his radiation appointments.  2. Abdominal pain: He is currently on long-acting morphine twice a day with breakthrough oxycodone and seems to be controlling his pain for the time being. This will be adjusted in the future. 2.  3. Jaundice: He is status post ERCP and stent placement on 11/12/2015. Jaundice appears to be improving at this time with improvement in the discoloration of his sclera and urine. We will continue to monitor this moving forward.  4. Anorexia: Appointment with the dietitians already been set up for further counseling regarding his nutritional status. Appetite stimulant can be used in the future.  5. Thrombosis prophylaxis: His risk of thrombosis will be high given the pancreatic malignancy but this will be negated given the fact that he is on a request for cardiac reasons. In no  6. Hypotension: His blood pressure is borderline I asked him to hold his amlodipine. He will probably need his blood pressure medication adjusted given the massive weight loss. He'll follow-up with cardiology and his primary care in the near future.  7. Diabetes: Given his massive weight loss he might no longer need his diabetic medication moving forward. I will defer that to Dr. Redmond Pulling regarding this issue.  8. Prognosis: He understands that he is a very difficult malignancy with poor prognosis regardless of the treatment modality. The chances of long term disease control is rather small given the limitation in his health to undergo aggressive therapy. He is aware of this and will continue to address CODE STATUS with  him going forward.  9. Follow-up: Will be in the next few weeks to follow-up on his progress once his therapy commences.

## 2015-11-27 ENCOUNTER — Ambulatory Visit
Admission: RE | Admit: 2015-11-27 | Discharge: 2015-11-27 | Disposition: A | Discharge status: Home / Self Care | Payer: BLUE CROSS/BLUE SHIELD | Source: Ambulatory Visit | Attending: Radiation Oncology | Admitting: Radiation Oncology

## 2015-11-27 ENCOUNTER — Other Ambulatory Visit: Payer: Self-pay | Admitting: *Deleted

## 2015-11-27 ENCOUNTER — Encounter: Payer: Self-pay | Admitting: Radiation Oncology

## 2015-11-27 VITALS — BP 73/38 | HR 95 | Temp 97.8°F | Resp 20 | Ht 71.0 in | Wt 154.8 lb

## 2015-11-27 DIAGNOSIS — E785 Hyperlipidemia, unspecified: Secondary | ICD-10-CM | POA: Insufficient documentation

## 2015-11-27 DIAGNOSIS — F17210 Nicotine dependence, cigarettes, uncomplicated: Secondary | ICD-10-CM | POA: Insufficient documentation

## 2015-11-27 DIAGNOSIS — I10 Essential (primary) hypertension: Secondary | ICD-10-CM | POA: Diagnosis not present

## 2015-11-27 DIAGNOSIS — C250 Malignant neoplasm of head of pancreas: Secondary | ICD-10-CM | POA: Diagnosis present

## 2015-11-27 DIAGNOSIS — I441 Atrioventricular block, second degree: Secondary | ICD-10-CM | POA: Diagnosis not present

## 2015-11-27 DIAGNOSIS — Z809 Family history of malignant neoplasm, unspecified: Secondary | ICD-10-CM | POA: Diagnosis not present

## 2015-11-27 DIAGNOSIS — F419 Anxiety disorder, unspecified: Secondary | ICD-10-CM | POA: Insufficient documentation

## 2015-11-27 DIAGNOSIS — Z510 Encounter for antineoplastic radiation therapy: Secondary | ICD-10-CM | POA: Diagnosis not present

## 2015-11-27 DIAGNOSIS — Z950 Presence of cardiac pacemaker: Secondary | ICD-10-CM | POA: Diagnosis not present

## 2015-11-27 DIAGNOSIS — E119 Type 2 diabetes mellitus without complications: Secondary | ICD-10-CM | POA: Diagnosis not present

## 2015-11-27 DIAGNOSIS — C259 Malignant neoplasm of pancreas, unspecified: Secondary | ICD-10-CM

## 2015-11-27 DIAGNOSIS — K8689 Other specified diseases of pancreas: Secondary | ICD-10-CM

## 2015-11-27 NOTE — Progress Notes (Signed)
Please see the Nurse Progress Note in the MD Initial Consult Encounter for this patient. 

## 2015-11-28 ENCOUNTER — Ambulatory Visit
Admission: RE | Admit: 2015-11-28 | Discharge: 2015-11-28 | Disposition: A | Discharge status: Home / Self Care | Payer: BLUE CROSS/BLUE SHIELD | Source: Ambulatory Visit | Attending: Radiation Oncology | Admitting: Radiation Oncology

## 2015-11-28 ENCOUNTER — Other Ambulatory Visit: Payer: Self-pay | Admitting: *Deleted

## 2015-11-28 DIAGNOSIS — C250 Malignant neoplasm of head of pancreas: Secondary | ICD-10-CM

## 2015-11-28 DIAGNOSIS — Z510 Encounter for antineoplastic radiation therapy: Secondary | ICD-10-CM | POA: Diagnosis not present

## 2015-11-28 LAB — COMPREHENSIVE METABOLIC PANEL
ALBUMIN: 2.7 g/dL — AB (ref 3.5–5.0)
ALK PHOS: 270 U/L — AB (ref 40–150)
ALT: 51 U/L (ref 0–55)
AST: 69 U/L — AB (ref 5–34)
Anion Gap: 8 mEq/L (ref 3–11)
BILIRUBIN TOTAL: 4.66 mg/dL — AB (ref 0.20–1.20)
BUN: 29.2 mg/dL — AB (ref 7.0–26.0)
CO2: 20 meq/L — AB (ref 22–29)
Calcium: 8.9 mg/dL (ref 8.4–10.4)
Chloride: 101 mEq/L (ref 98–109)
Creatinine: 2 mg/dL — ABNORMAL HIGH (ref 0.7–1.3)
EGFR: 31 mL/min/{1.73_m2} — ABNORMAL LOW (ref >90)
GLUCOSE: 89 mg/dL (ref 70–140)
Potassium: 5.3 mEq/L — ABNORMAL HIGH (ref 3.5–5.1)
SODIUM: 130 meq/L — AB (ref 136–145)
TOTAL PROTEIN: 7.1 g/dL (ref 6.4–8.3)

## 2015-11-28 NOTE — Progress Notes (Signed)
Results of  CMP" BUN=29.2, CR=2.0, Bili riubin =4.66, MD informed, his last BIlirubin ws 9.1 is coming down, but patient is still dehydrated, unable to do IV contrast, informed CT Sim and patient,  Patient to restart his metformin tonight and push fluids, drink plenty water,gatorade,juices etc, patient gave verbal understanding of both 2:44 PM

## 2015-12-01 NOTE — Progress Notes (Addendum)
Radiation Oncology         [phone] ________________________________  Name: Antonio Hancock MRN: [id number]  Date: 11/27/2015  DOB: 01-01-1941  [id number] Mallie Mussel, MD  Nicholas Lose, MD     REFERRING PHYSICIAN: Nicholas Lose, MD   DIAGNOSIS: The primary encounter diagnosis was Malignant neoplasm of head of pancreas ([place]). Diagnoses of Pancreatic mass and Adenocarcinoma of pancreas [facility]) were also pertinent to this visit.     ICD-9-CM ICD-10-CM   1. Malignant neoplasm of head of pancreas (HCC) 157.0 C25.0 Comprehensive metabolic panel  2. Pancreatic mass 577.9 K86.9 Ambulatory referral to Social Work  3. Adenocarcinoma of pancreas (HCC) 157.9 C25.9       HISTORY OF PRESENT ILLNESS::Antonio Hancock is a 74 y.o. male who is seen for an initial consultation visit regarding the patient's diagnosis of pancreatic cancer.  The patient initially developed some right-sided flank pain in July 2016. A CT scan of the abdomen demonstrated a 3.0 cm mass in the anterior aspect of the right kidney. Interventional radiology evaluated the patient for possible ablation and he underwent pre-treatment further evaluation. He subsequently began developing abdominal pain, weight loss, and subsequently jaundice. Prior to undergoing the ablation he was noted to have significantly increased ileal Ruben. Cryoablation was aborted at that time.  The patient therefore proceeded with a CT scan of the abdomen that demonstrated a 4 cm pancreatic mass which was concerning for possible malignancy. The patient underwent endoscopic ultrasound and ERCP procedure that showed no significant lymphadenopathy or local invasion. A stent was placed and a fine needle aspiration of the pancreatic mass was performed that returned positive for adenocarcinoma. Further CT scan of the chest did not reveal any metastatic disease.  The patient has been evaluated by Dr. Barry Dienes but is not felt to be a good candidate for surgical resection  currently given his health status and significant weight loss. He has lost approximately 45 pounds over the last several months. He notes some nausea and decreased appetite on a continuing basis. The patient has been seen by medical oncology and I have been asked to see the patient for consideration of radiation treatment.    PREVIOUS RADIATION THERAPY: No   PAST MEDICAL HISTORY:  has a past medical history of Hypertension; Diabetes mellitus; Mobitz type 2 second degree AV block; Tobacco abuse; Pacemaker (2012); Chronotropic incompetence with sinus node dysfunction ([place]) (2012); Hyperlipidemia; HOH (hard of hearing); and Anxiety.     PAST SURGICAL HISTORY: Past Surgical History  Procedure Laterality Date  . Back surgery    . Permanent pacemaker insertion N/A 11/12/2011    Procedure: PERMANENT PACEMAKER INSERTION;  Surgeon: Deboraha Sprang, MD;  Location: [facility] CATH LAB;  Service: Cardiovascular;  Laterality: Left; Medtronic Adapta L pulse generator, serial number [id number]    . Hernia repair  1996    left inguinal hernia repair   . Ercp N/A 10/24/2015    Procedure: ENDOSCOPIC RETROGRADE CHOLANGIOPANCREATOGRAPHY (ERCP);  Surgeon: Carol Ada, MD;  Location: [facility] ENDOSCOPY;  Service: Endoscopy;  Laterality: N/A;  . Biliary stent placement N/A 10/24/2015    Procedure: BILIARY STENT PLACEMENT;  Surgeon: Carol Ada, MD;  Location: [facility] ENDOSCOPY;  Service: Endoscopy;  Laterality: N/A;  . Eus N/A 11/07/2015    Procedure: FULL UPPER ENDOSCOPIC ULTRASOUND (EUS) RADIAL;  Surgeon: Carol Ada, MD;  Location: [facility] ENDOSCOPY;  Service: Endoscopy;  Laterality: N/A;  . Fine needle aspiration N/A 11/07/2015    Procedure: FINE NEEDLE ASPIRATION (FNA) LINEAR;  Surgeon: Carol Ada, MD;  Location:  [facility] ENDOSCOPY;  Service: Endoscopy;  Laterality: N/A;  . Ercp N/A 11/07/2015    Procedure: ENDOSCOPIC RETROGRADE CHOLANGIOPANCREATOGRAPHY (ERCP);  Surgeon: Carol Ada, MD;  Location: [facility] ENDOSCOPY;  Service: Endoscopy;   Laterality: N/A;     FAMILY HISTORY: family history includes Brain cancer in his mother; Cancer in his father and mother; Deep vein thrombosis in his father; Diabetes in his father; Hypertension in his brother, father, and sister; Prostate cancer in his father. There is no history of Heart attack.   SOCIAL HISTORY:  reports that he has been smoking Cigarettes.  He has a 50 pack-year smoking history. He has never used smokeless tobacco. He reports that he does not drink alcohol or use illicit drugs.   ALLERGIES: Review of patient's allergies indicates no known allergies.   MEDICATIONS:  Current Outpatient Prescriptions  Medication Sig Dispense Refill  . ALPRAZolam (XANAX) 0.25 MG tablet Take 0.25 mg by mouth at bedtime.     Marland Kitchen apixaban (ELIQUIS) 5 MG TABS tablet Take 1 tablet (5 mg total) by mouth 2 (two) times daily. 60 tablet 5  . atorvastatin (LIPITOR) 10 MG tablet Take 10 mg by mouth daily.    . metFORMIN (GLUCOPHAGE-XR) 500 MG 24 hr tablet Take 500 mg by mouth daily with breakfast.     . morphine (MS CONTIN) 30 MG 12 hr tablet Take 30 mg by mouth every 12 (twelve) hours.  0  . Multiple Vitamins-Minerals (MULTIVITAMINS THER. W/MINERALS) TABS Take 1 tablet by mouth daily.      Marland Kitchen olmesartan (BENICAR) 40 MG tablet Take 40 mg by mouth daily.    Marland Kitchen oxyCODONE (OXY IR/ROXICODONE) 5 MG immediate release tablet Take 1 tablet (5 mg total) by mouth every 4 (four) hours as needed for moderate pain. 45 tablet 0  . pantoprazole (PROTONIX) 40 MG tablet Take 40 mg by mouth daily.    . promethazine (PHENERGAN) 12.5 MG tablet Take 12.5 mg by mouth every 6 (six) hours as needed.  1  . amLODipine (NORVASC) 5 MG tablet Reported on 11/27/2015  3  . lipase/protease/amylase (CREON) 36000 UNITS CPEP capsule Take 1 capsule (36,000 Units total) by mouth 3 (three) times daily with meals. (Patient not taking: Reported on 11/27/2015) 270 capsule 1   No current facility-administered medications for this encounter.      REVIEW OF SYSTEMS:  A 15 point review of systems is documented in the electronic medical record. This was obtained by the nursing staff. However, I reviewed this with the patient to discuss relevant findings and make appropriate changes.  Pertinent items are noted in HPI.    PHYSICAL EXAM:  height is 5\' 11"  (1.803 m) and weight is 154 lb 12.8 oz (70.217 kg). His oral temperature is 97.8 F (36.6 C). His blood pressure is 73/38 and his pulse is 95. His respiration is 20.   ECOG = 1  0 - Asymptomatic (Fully active, able to carry on all predisease activities without restriction)  1 - Symptomatic but completely ambulatory (Restricted in physically strenuous activity but ambulatory and able to carry out work of a light or sedentary nature. For example, light housework, office work)  2 - Symptomatic, <50% in bed during the day (Ambulatory and capable of all self care but unable to carry out any work activities. Up and about more than 50% of waking hours)  3 - Symptomatic, >50% in bed, but not bedbound (Capable of only limited self-care, confined to bed or chair 50% or more of waking hours)  4 - Bedbound (Completely disabled. Cannot carry on any self-care. Totally confined to bed or chair)  5 - Death   Eustace Pen MM, Creech RH, Tormey DC, et al. [phone]). "Toxicity and response criteria of the [facility]". [place] [facility] 5 (6): 649-55  General: Well-developed, in no acute distress HEENT: Normocephalic, atraumatic; oral cavity clear Neck: Supple without any lymphadenopathy Cardiovascular: Regular rate and rhythm Respiratory: Clear to auscultation bilaterally GI: Soft, nontender, normal bowel sounds Extremities: No edema present Neuro: No focal deficits    LABORATORY DATA:  Lab Results  Component Value Date   WBC 3.1* 10/24/2015   HGB 10.1* 10/24/2015   HCT 28.2* 10/24/2015   MCV 86.0 10/24/2015   PLT 217 10/24/2015   Lab Results  Component Value Date    NA 130* 11/28/2015   K 5.3* 11/28/2015   CL 97* 10/24/2015   CO2 20* 11/28/2015   Lab Results  Component Value Date   ALT 51 11/28/2015   AST 69* 11/28/2015   ALKPHOS 270* 11/28/2015   BILITOT 4.66* 11/28/2015      RADIOGRAPHY: Ct Abdomen Wo Contrast  11/14/2015  CLINICAL DATA:  Evaluate stent placement. Patient has pancreatic cancer. EXAM: CT ABDOMEN WITHOUT CONTRAST TECHNIQUE: Multidetector CT imaging of the abdomen was performed following the standard protocol without IV contrast. COMPARISON:  CT abdomen 10/23/2015 FINDINGS: A biliary stent has been placed across the pancreatic mass. Pneumobilia is present. Improved intrahepatic ductal dilatation. Mild perihepatic ascites. Moderate gallbladder distention. No significant change in pancreatic head mass. Mildly prominent small bowel loops suggesting ascites. Previous lumbar instrumentation for T12 compression fracture. Pacemaker. No lung base nodules. IMPRESSION: Satisfactory biliary stent placement. Improved intrahepatic biliary ductal dilatation. Pneumobilia. Electronically Signed   By: Staci Righter M.D.   On: 11/14/2015 15:57   Ct Chest W Contrast  11/25/2015  CLINICAL DATA:  Pancreatic cancer diagnosed November, 2016. Staging examination. EXAM: CT CHEST WITH CONTRAST TECHNIQUE: Multidetector CT imaging of the chest was performed during intravenous contrast administration. CONTRAST:  75 mL OMNIPAQUE IOHEXOL 300 MG/ML  SOLN COMPARISON:  CT abdomen 10/23/2015. Scratch the CT abdomen 11/14/2015 10/23/2015. FINDINGS: There is no axillary, hilar or mediastinal lymphadenopathy. Calcific aortic and coronary atherosclerosis is identified. No pleural or pericardial effusion. No pulmonary nodule, mass or consolidative process is identified. A small focus of linear atelectasis or scar is seen in the left lung base. Visualized upper abdomen demonstrates pneumobilia with some air in the gallbladder noted. The patient's common bile duct stent is  incompletely visualized. The visualized pancreas appears somewhat atrophic with a prominent pancreatic duct. Small volume of perihepatic ascites is noted. No lytic or sclerotic bony lesion is seen. Thoracolumbar fusion hardware for T12 compression fracture is partially visualized. IMPRESSION: Negative for metastatic disease to the chest. Calcific aortic and coronary atherosclerosis. Electronically Signed   By: Inge Rise M.D.   On: 11/25/2015 15:22   Dg Ercp Biliary & Pancreatic Ducts  11/07/2015  CLINICAL DATA:  Clogged common bile duct stent. EXAM: ERCP TECHNIQUE: Multiple spot images obtained with the fluoroscopic device and submitted for interpretation post-procedure. FLUOROSCOPY TIME:  If the device does not provide the exposure index: Fluoroscopy Time:  4 minutes and 6 seconds Number of Acquired Images:  5 COMPARISON:  10/24/2015 FINDINGS: Common bile duct was cannulated. Wire advanced into the intrahepatic biliary system. A metallic biliary stent was placed. There is contrast in the metallic biliary stent and it appears to be patent. IMPRESSION: Placement of a metallic biliary stent.  These images were submitted for radiologic interpretation only. Please see the procedural report for the amount of contrast and the fluoroscopy time utilized. Electronically Signed   By: Markus Daft M.D.   On: 11/07/2015 12:19       IMPRESSION:  The patient has a diagnosis of pancreatic cancer of the head of the pancreas. At this time, I would clinically stage the patient as having T3 N1 M0 adenocarcinoma of the head of the pancreas. He is not felt to be a good candidate for surgical resection currently given his health status and also is not felt to be an ideal candidate for multi-agent aggressive chemotherapy. He does appear to be a good candidate for initial chemoradiation treatment.  I discussed this with the patient. We discussed a potential 5-1/2 week course of treatment. We also discussed the rationale of  such a treatment as well as the possible side effects and risks. All of his questions were answered.   PLAN: The patient will proceed with a simulation tomorrow such that we can proceed with treatment planning. I anticipate treating the patient with 5 and half weeks of radiation treatment, beginning on 12/10/2015. The patient will begin concurrent Xeloda chemotherapy coordinated through medical oncology.      ________________________________   Jodelle Gross, MD, PhD   **Disclaimer: This note was dictated with voice recognition software. Similar sounding words can inadvertently be transcribed and this note may contain transcription errors which may not have been corrected upon publication of note.**

## 2015-12-01 NOTE — Addendum Note (Signed)
Encounter addended by: Kyung Rudd, MD on: 12/01/2015  6:08 PM<BR>     Documentation filed: Follow-up Section, LOS Section

## 2015-12-02 ENCOUNTER — Other Ambulatory Visit: Payer: Self-pay | Admitting: Oncology

## 2015-12-02 DIAGNOSIS — C250 Malignant neoplasm of head of pancreas: Secondary | ICD-10-CM

## 2015-12-02 MED ORDER — CAPECITABINE 500 MG PO TABS: ORAL_TABLET | ORAL | Status: DC

## 2015-12-02 NOTE — Progress Notes (Signed)
Quick Note:  Please let patient know chest CT is negative. ______

## 2015-12-05 ENCOUNTER — Ambulatory Visit (INDEPENDENT_AMBULATORY_CARE_PROVIDER_SITE_OTHER): Payer: BLUE CROSS/BLUE SHIELD | Admitting: Physician Assistant

## 2015-12-05 ENCOUNTER — Telehealth: Payer: Self-pay | Admitting: Pharmacist

## 2015-12-05 ENCOUNTER — Encounter: Payer: Self-pay | Admitting: Physician Assistant

## 2015-12-05 VITALS — BP 100/50 | HR 80 | Ht 71.5 in | Wt 156.2 lb

## 2015-12-05 DIAGNOSIS — I442 Atrioventricular block, complete: Secondary | ICD-10-CM

## 2015-12-05 DIAGNOSIS — I480 Paroxysmal atrial fibrillation: Secondary | ICD-10-CM

## 2015-12-05 DIAGNOSIS — I9589 Other hypotension: Secondary | ICD-10-CM

## 2015-12-05 DIAGNOSIS — I4891 Unspecified atrial fibrillation: Secondary | ICD-10-CM | POA: Diagnosis not present

## 2015-12-05 DIAGNOSIS — I10 Essential (primary) hypertension: Secondary | ICD-10-CM

## 2015-12-05 NOTE — Progress Notes (Signed)
Cardiology Office Note Date:  12/05/2015  Patient ID:  Antonio Hancock, DOB Dec 28, 1940, MRN [id number] PCP:  Woody Seller, MD   Electrophysiologist: Dr. Caryl Comes IRad Onc: Dr. Lisbeth Renshaw    Chief Complaint: routinr annual EP visit  History of Present Illness: Antonio Hancock is a 74 y.o. male with history of 2nd degree AVblock II s/p PPM, recently found with PAF started on Eliquis September 2016, he was found with a renal mass and seen and cleared from cardiology for interventional radiology procedure to further evaluate this, unfortunately became ill and eventually diagnosed with pancreatic cancer/adenocarcinoma last month without evidence of mets, notes remark he is likely not felt a surgical candidate due to comorbid conditions and overall poor health, he state they will revisit surgical option pending how much the mass decreases in size.  Pending initiation of chemoradiation therapy, seen by rad onc 11/27/15, he also has DM, HTN.  He has significant functional decline and onngoing weight loss with reported loss of 46 pounds in 4 months with poor appetite and intake. He is morphine and oxycodone for pain management.  His BP has been found borderline by his oncologist and his amlodipine held earlier this month.  At his 11/26/15 visit with oncology his jaundice was reported to be improving.  His wife states is much improved.  She mentions that the renal mass evaluation has been put on hold for now.  He comes in today feeling tired, no appetite, unable to keep any weight on, continues to lose.  Unfortunately still smoking, though he expresses that he is motivated to smoke less and has been in the last few days, but difficult with the anxiety of his cancer diagnosis.  He will occasionally feel lightheaded upon standing, no near syncope or syncope, no CP no palpitations.  Past Medical History  Diagnosis Date  . Hypertension   . Diabetes mellitus     Diagnosed Fall 2012  . Mobitz type 2 second  degree AV block     11/11/2011  . Tobacco abuse     1/2 ppd x 55 yrs  . Pacemaker 2012    Medtronic Adapta L pulse generator, serial number [id number]  . Chronotropic incompetence with sinus node dysfunction ([place]) 2012    s/p MDT PPM  . Hyperlipidemia   . HOH (hard of hearing)   . Anxiety     Past Surgical History  Procedure Laterality Date  . Back surgery    . Permanent pacemaker insertion N/A 11/12/2011    Procedure: PERMANENT PACEMAKER INSERTION;  Surgeon: Deboraha Sprang, MD;  Location: [facility] CATH LAB;  Service: Cardiovascular;  Laterality: Left; Medtronic Adapta L pulse generator, serial number [id number]    . Hernia repair  1996    left inguinal hernia repair   . Ercp N/A 10/24/2015    Procedure: ENDOSCOPIC RETROGRADE CHOLANGIOPANCREATOGRAPHY (ERCP);  Surgeon: Carol Ada, MD;  Location: [facility];  Service: Endoscopy;  Laterality: N/A;  . Biliary stent placement N/A 10/24/2015    Procedure: BILIARY STENT PLACEMENT;  Surgeon: Carol Ada, MD;  Location: [facility] ENDOSCOPY;  Service: Endoscopy;  Laterality: N/A;  . Eus N/A 11/07/2015    Procedure: FULL UPPER ENDOSCOPIC ULTRASOUND (EUS) RADIAL;  Surgeon: Carol Ada, MD;  Location: [facility] ENDOSCOPY;  Service: Endoscopy;  Laterality: N/A;  . Fine needle aspiration N/A 11/07/2015    Procedure: FINE NEEDLE ASPIRATION (FNA) LINEAR;  Surgeon: Carol Ada, MD;  Location: [facility] ENDOSCOPY;  Service: Endoscopy;  Laterality: N/A;  . Ercp N/A 11/07/2015    Procedure:  ENDOSCOPIC RETROGRADE CHOLANGIOPANCREATOGRAPHY (ERCP);  Surgeon: Carol Ada, MD;  Location: [facility] ENDOSCOPY;  Service: Endoscopy;  Laterality: N/A;    Current Outpatient Prescriptions  Medication Sig Dispense Refill  . ALPRAZolam (XANAX) 0.25 MG tablet Take 0.25 mg by mouth at bedtime.     Marland Kitchen apixaban (ELIQUIS) 5 MG TABS tablet Take 1 tablet (5 mg total) by mouth 2 (two) times daily. 60 tablet 5  . atorvastatin (LIPITOR) 10 MG tablet Take 10 mg by mouth daily.    . capecitabine (XELODA) 500  MG tablet Take one tablet twice a day Monday to Friday with radiation. 50 tablet 0  . metFORMIN (GLUCOPHAGE-XR) 500 MG 24 hr tablet Take 500 mg by mouth daily.     Marland Kitchen morphine (MS CONTIN) 30 MG 12 hr tablet Take 30 mg by mouth every 12 (twelve) hours.  0  . Multiple Vitamins-Minerals (MULTIVITAMINS THER. W/MINERALS) TABS Take 1 tablet by mouth daily.      Marland Kitchen olmesartan (BENICAR) 40 MG tablet Take 40 mg by mouth daily.    Marland Kitchen oxyCODONE (OXY IR/ROXICODONE) 5 MG immediate release tablet Take 1 tablet (5 mg total) by mouth every 4 (four) hours as needed for moderate pain. 45 tablet 0  . pantoprazole (PROTONIX) 40 MG tablet Take 40 mg by mouth daily.    . promethazine (PHENERGAN) 12.5 MG tablet Take 12.5 mg by mouth every 6 (six) hours as needed for nausea or vomiting.   1   No current facility-administered medications for this visit.    Allergies:   Review of patient's allergies indicates no known allergies.   Social History:  The patient  reports that he has been smoking Cigarettes.  He has a 50 pack-year smoking history. He has never used smokeless tobacco. He reports that he does not drink alcohol or use illicit drugs.   Family History:  The patient's family history includes Brain cancer in his mother; Cancer in his father and mother; Deep vein thrombosis in his father; Diabetes in his father; Hypertension in his brother, father, and sister; Prostate cancer in his father. There is no history of Heart attack.  ROS:  Please see the history of present illness.  All other systems are reviewed and otherwise negative.   PHYSICAL EXAM:  VS:  BP 100/50 mmHg  Pulse 80  Ht 5' 11.5" (1.816 m)  Wt 156 lb 3.2 oz (70.852 kg)  BMI 21.48 kg/m2 BMI: Body mass index is 21.48 kg/(m^2). Thin, looks older then his age, in no acute distress HEENT: normocephalic, atraumatic, poor/missing dentitian Neck: no JVD, carotid bruits or masses Cardiac:  normal S1, S2; RRR; no significant murmurs, no rubs, or  gallops Lungs:  clear to auscultation bilaterally, soft end-exp wheezing b/l, no rhonchi or rales Abd: soft, nontender MS: no deformity or atrophy Ext: no edema Skin: warm and dry, no rash Neuro:  No gross deficits appreciated Psych: euthymic mood, full affect  PPMsite is stable, no tethering or discomfort   EKG:  Done today shows SR, RBBB, LAD/LAFB PPM check today: shows normal function, V pacing only 0.2%  08/26/15: Echocardiogram Study Conclusions - Left ventricle: The cavity size was normal. Wall thickness was increased in a pattern of moderate LVH. Systolic function was normal. The estimated ejection fraction was in the range of 60% to 65%. Wall motion was normal; there were no regional wall motion abnormalities. Doppler parameters are consistent with abnormal left ventricular relaxation (grade 1 diastolic dysfunction). - Right atrium: The atrium was mildly dilated.  Recent Labs:  10/24/2015: Hemoglobin 10.1*; Platelets 217 11/28/2015: ALT 51; BUN 29.2*; Creatinine 2.0*; Potassium 5.3*; Sodium 130*  No results found for requested labs within last 365 days.   Estimated Creatinine Clearance: 32.5 mL/min (by C-G formula based on Cr of 2).   Wt Readings from Last 3 Encounters:  12/05/15 156 lb 3.2 oz (70.852 kg)  11/27/15 154 lb 12.8 oz (70.217 kg)  11/26/15 154 lb 4.8 oz (69.99 kg)     Other studies reviewed: Additional studies/records reviewed today include: summarized above  DEVICE information: MDT PPM implanted 11/12/11 by Dr. Caryl Comes  ASSESSMENT AND PLAN:  1. PAF, heart block/PPM CHADS2Vasc is at least 3 on Eliquis [facility] remotes q 3 months  Creat was 2.0 on 11/28/15, 10/24/15 H/H 10/28 (10/16/15 H/H 12/34), we will defer to his oncologist if he remains a good full a/c candidate, especially as his cancer treatments get started. Discussed with the patient and his wife they will discuss with them further.  2. HTN Hypotension noted at his oncology  visit off amlodipine his BP is on lower side asymptomatic.  We discussed standing slowly, safety, avoid long periods of standing and encouraged adequate water intake/PO intake, he is on pain meds that may be contributing as well.  3. Pancreatic cancer Pending initiation of rad tx adn Xeloda Functional decline, anorexia, significant weight loss  4. ARI  Appears RN (from oncology?) has been in contact with this patient regarding his labs He will continue f/u with his PMD, oncology team   Disposition: F/u with in-clinic EP, Dr. Caryl Comes in 1 year, continue Q 3 month remotes. He will continue with his oncology team and PMD.  Current medicines are reviewed at length with the patient today.  The patient did not have any concerns regarding medicines.  Haywood Lasso, PA-C 12/05/2015 4:08 PM     [facility] [address] [place] [address] [phone] (office)  [phone] (fax)

## 2015-12-05 NOTE — Patient Instructions (Addendum)
 Medication Instructions:   Your physician recommends that you continue on your current medications as directed. Please refer to the Current Medication list given to you today.  If you need a refill on your cardiac medications before your next appointment, please call your pharmacy.  Labwork: NONE ORDER TODAY   Testing/Procedures:   NONE ORDER TODAY    Follow-Up: Your physician wants you to follow-up in: [place] will receive a reminder letter in the mail two months in advance. If you don't receive a letter, please call our office to schedule the follow-up appointment.   Remote monitoring is used to monitor your Pacemaker of ICD from home. This monitoring reduces the number of office visits required to check your device to one time per year. It allows [place] to keep an eye on the functioning of your device to ensure it is working properly. You are scheduled for a device check from home on   You may send your transmission at any time that day. If you have a wireless device, the transmission will be sent automatically. After your physician reviews your transmission, you will receive a postcard with your next transmission date.    Any Other Special Instructions Will Be Listed Below (If Applicable).

## 2015-12-05 NOTE — Telephone Encounter (Signed)
12/30 - Called patient to discuss new oral medication: Xeloda. Unable to reach patient. Left VM for patient to call back. Rx is  at [facility] copay is $0.   Thank you,  Montel Clock, PharmD, [facility]

## 2015-12-09 ENCOUNTER — Other Ambulatory Visit: Payer: Self-pay | Admitting: *Deleted

## 2015-12-09 DIAGNOSIS — Z510 Encounter for antineoplastic radiation therapy: Secondary | ICD-10-CM | POA: Diagnosis not present

## 2015-12-10 ENCOUNTER — Ambulatory Visit: Payer: BLUE CROSS/BLUE SHIELD | Admitting: Radiation Oncology

## 2015-12-10 ENCOUNTER — Telehealth: Payer: Self-pay | Admitting: Oncology

## 2015-12-10 NOTE — Telephone Encounter (Signed)
Aware of chemo class on 1/5

## 2015-12-11 ENCOUNTER — Ambulatory Visit
Admission: RE | Admit: 2015-12-11 | Discharge: 2015-12-11 | Disposition: A | Discharge status: Home / Self Care | Payer: BLUE CROSS/BLUE SHIELD | Source: Ambulatory Visit | Attending: Radiation Oncology | Admitting: Radiation Oncology

## 2015-12-11 ENCOUNTER — Other Ambulatory Visit: Payer: BLUE CROSS/BLUE SHIELD

## 2015-12-11 ENCOUNTER — Ambulatory Visit: Payer: BLUE CROSS/BLUE SHIELD

## 2015-12-11 ENCOUNTER — Encounter: Payer: Self-pay | Admitting: *Deleted

## 2015-12-11 ENCOUNTER — Encounter: Payer: Self-pay | Admitting: Pharmacist

## 2015-12-11 DIAGNOSIS — C250 Malignant neoplasm of head of pancreas: Secondary | ICD-10-CM

## 2015-12-11 DIAGNOSIS — Z510 Encounter for antineoplastic radiation therapy: Secondary | ICD-10-CM | POA: Diagnosis not present

## 2015-12-11 MED ORDER — SONAFINE EX EMUL
1.0000 "application " | Freq: Once | CUTANEOUS | Status: AC
  Administered 2015-12-11: 1 via TOPICAL
  Filled 2015-12-11: qty 45

## 2015-12-11 MED ORDER — SONAFINE EX EMUL
1.0000 "application " | Freq: Once | CUTANEOUS | Status: DC
  Filled 2015-12-11: qty 45

## 2015-12-11 MED FILL — CAPECITABINE 500 MG TABLET: 500 | 34 days supply | Qty: 50 | Fill #0

## 2015-12-11 NOTE — Progress Notes (Signed)
Oral Chemotherapy Pharmacist Encounter   I spoke with patient for overview of new oral chemotherapy medication: Xeloda. Pt is doing well. The prescriptions have been sent to the [facility] for benefit analysis and approval. Copay is $0. Mr. Kenworthy has not picked up the medication yet. But will pick it up today.   Counseled patient on administration, dosing, side effects, safe handling, and monitoring. Side effects include but not limited to: Diarrhea, fatigue, hand/foot syndrome, and stomatitis.  Mr. Ruschak voiced understanding and appreciation.   All questions answered.  Will follow up with patient regarding insurance and pharmacy. Will follow up in 1 week for adherence and toxicity management.   Thank you,  Montel Clock, PharmD, [facility]

## 2015-12-11 NOTE — Progress Notes (Signed)
Pt here for patient teaching.  Pt given Radiation and You booklet, skin care instructions and Sonafine. Pt reports they have not watched the Radiation Therapy Education video and were given the link to watch at home.  Reviewed areas of pertinence such as diarrhea, fatigue, nausea and vomiting, skin changes, urinary and bladder changes, breast swelling, cough, shortness of breath, earaches and taste changes . Pt able to give teach back of to pat skin, use unscented/gentle soap, have Imodium on hand and drink plenty of water,apply Sonafine bid and avoid applying anything to skin within 4 hours of treatment. Pt verbalizes understanding of information given and will contact nursing with any questions or concerns.     [email or web address]

## 2015-12-12 ENCOUNTER — Ambulatory Visit
Admission: RE | Admit: 2015-12-12 | Discharge: 2015-12-12 | Disposition: A | Discharge status: Home / Self Care | Payer: BLUE CROSS/BLUE SHIELD | Source: Ambulatory Visit | Attending: Radiation Oncology | Admitting: Radiation Oncology

## 2015-12-12 ENCOUNTER — Encounter: Payer: Self-pay | Admitting: Radiation Oncology

## 2015-12-12 VITALS — BP 104/57 | HR 93 | Ht 71.5 in | Wt 154.4 lb

## 2015-12-12 DIAGNOSIS — C250 Malignant neoplasm of head of pancreas: Secondary | ICD-10-CM

## 2015-12-12 DIAGNOSIS — Z510 Encounter for antineoplastic radiation therapy: Secondary | ICD-10-CM | POA: Diagnosis not present

## 2015-12-12 NOTE — Progress Notes (Signed)
Department of Radiation Oncology  Phone:  [phone] Fax:        [phone]  Weekly Treatment Note    Name: Antonio Hancock Date: 12/12/2015 MRN: [id number] DOB: [birth year]   Diagnosis:     ICD-9-CM ICD-10-CM   1. 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 ALPRAZolam (XANAX) 0.25 MG tablet Take 0.25 mg by mouth at bedtime.     Marland Kitchen apixaban (ELIQUIS) 5 MG TABS tablet Take 1 tablet (5 mg total) by mouth 2 (two) times daily. 60 tablet 5  . atorvastatin (LIPITOR) 10 MG tablet Take 10 mg by mouth daily.    . capecitabine (XELODA) 500 MG tablet Take one tablet twice a day Monday to Friday with radiation. 50 tablet 0  . metFORMIN (GLUCOPHAGE-XR) 500 MG 24 hr tablet Take 500 mg by mouth daily.     Marland Kitchen morphine (MS CONTIN) 30 MG 12 hr tablet Take 30 mg by mouth every 12 (twelve) hours.  0  . Multiple Vitamins-Minerals (MULTIVITAMINS THER. W/MINERALS) TABS Take 1 tablet by mouth daily.      Marland Kitchen oxyCODONE (OXY IR/ROXICODONE) 5 MG immediate release tablet Take 1 tablet (5 mg total) by mouth every 4 (four) hours as needed for moderate pain. 45 tablet 0  . pantoprazole (PROTONIX) 40 MG tablet Take 40 mg by mouth daily.    . promethazine (PHENERGAN) 12.5 MG tablet Take 12.5 mg by mouth every 6 (six) hours as needed for nausea or vomiting.   1  . olmesartan (BENICAR) 40 MG tablet Take 40 mg by mouth daily. Reported on 12/12/2015     No current facility-administered medications for this encounter.     ALLERGIES: Review of patient's allergies indicates no known allergies.   LABORATORY DATA:  Lab Results  Component Value Date   WBC 3.1* 10/24/2015   HGB 10.1* 10/24/2015   HCT 28.2* 10/24/2015   MCV 86.0 10/24/2015   PLT 217 10/24/2015   Lab Results  Component Value Date   NA 130* 11/28/2015   K 5.3* 11/28/2015   CL 97* 10/24/2015   CO2  20* 11/28/2015   Lab Results  Component Value Date   ALT 51 11/28/2015   AST 69* 11/28/2015   ALKPHOS 270* 11/28/2015   BILITOT 4.66* 11/28/2015     NARRATIVE: Antonio Hancock was seen today for weekly treatment management. The chart was checked and the patient's films were reviewed.  Antonio Hancock presents for his 2nd fraction of radiation to his Pancreas. He denies any fatigue. He does report some nausea which comes and goes, and he does vomit at times. He states he does have diarrhea about every 3 days which alternates with constipation, and he does not take anything to relieve this, as it makes it worse per his report. He reports food "tastes terrible and does not smell right". He is eating vegetable soup frequently and applesauce. He is sprinkling protein powder on his soup and applesauce. He has tried ensure and boost supplements but state they make him throw up. He is taking phenergan daily in the morning to help combat his nausea.   BP 104/57 mmHg  Pulse 93  Ht 5' 11.5" (1.816 m)  Wt 154 lb 6.4 oz (70.035 kg)  BMI 21.24 kg/m2   Wt Readings from Last 3 Encounters:  12/12/15 154 lb 6.4 oz (70.035 kg)  12/05/15 156 lb 3.2 oz (70.852 kg)  11/27/15 154 lb 12.8 oz (70.217 kg)    PHYSICAL EXAMINATION: height is 5' 11.5" (1.816 m) and weight is 154 lb 6.4 oz (70.035 kg). His blood pressure is 104/57 and his pulse is 93.        ASSESSMENT: The patient is doing satisfactorily with treatment.  PLAN: We will continue with the patient's radiation treatment as planned. The patient will let us know if his nausea worsens. He does have medication currently for this which is working satisfactorily.

## 2015-12-12 NOTE — Progress Notes (Signed)
Antonio Hancock presents for his 2nd fraction of radiation to his Pancreas. He denies any fatigue. He does report some nausea which comes and goes, and he does vomit at times. He states he does have diarrhea about every 3 days which alternates with constipation, and he does not take anything to relieve this, as it makes it worse per his report. He reports food "tastes terrible and does not smell right". He is eating vegetable soup frequently and applesauce. He is sprinkling protein powder on his soup and applesauce. He has tried ensure and boost supplements but state they make him throw up. He is taking phenergan daily in the morning to help combat his nausea.   BP 104/57 mmHg  Pulse 93  Ht 5' 11.5" (1.816 m)  Wt 154 lb 6.4 oz (70.035 kg)  BMI 21.24 kg/m2   Wt Readings from Last 3 Encounters:  12/12/15 154 lb 6.4 oz (70.035 kg)  12/05/15 156 lb 3.2 oz (70.852 kg)  11/27/15 154 lb 12.8 oz (70.217 kg)

## 2015-12-15 ENCOUNTER — Ambulatory Visit
Admission: RE | Admit: 2015-12-15 | Discharge: 2015-12-15 | Disposition: A | Discharge status: Home / Self Care | Payer: BLUE CROSS/BLUE SHIELD | Source: Ambulatory Visit | Attending: Radiation Oncology | Admitting: Radiation Oncology

## 2015-12-15 DIAGNOSIS — Z510 Encounter for antineoplastic radiation therapy: Secondary | ICD-10-CM | POA: Diagnosis not present

## 2015-12-16 ENCOUNTER — Telehealth: Payer: Self-pay | Admitting: Oncology

## 2015-12-16 ENCOUNTER — Other Ambulatory Visit (HOSPITAL_BASED_OUTPATIENT_CLINIC_OR_DEPARTMENT_OTHER): Payer: BLUE CROSS/BLUE SHIELD

## 2015-12-16 ENCOUNTER — Ambulatory Visit
Admission: RE | Admit: 2015-12-16 | Discharge: 2015-12-16 | Disposition: A | Discharge status: Home / Self Care | Payer: BLUE CROSS/BLUE SHIELD | Source: Ambulatory Visit | Attending: Radiation Oncology | Admitting: Radiation Oncology

## 2015-12-16 ENCOUNTER — Ambulatory Visit (HOSPITAL_BASED_OUTPATIENT_CLINIC_OR_DEPARTMENT_OTHER): Payer: BLUE CROSS/BLUE SHIELD | Admitting: Oncology

## 2015-12-16 ENCOUNTER — Other Ambulatory Visit: Payer: BLUE CROSS/BLUE SHIELD

## 2015-12-16 VITALS — BP 103/53 | HR 92 | Temp 97.7°F | Resp 18 | Ht 71.5 in | Wt 155.9 lb

## 2015-12-16 DIAGNOSIS — I959 Hypotension, unspecified: Secondary | ICD-10-CM

## 2015-12-16 DIAGNOSIS — Z510 Encounter for antineoplastic radiation therapy: Secondary | ICD-10-CM | POA: Diagnosis not present

## 2015-12-16 DIAGNOSIS — C250 Malignant neoplasm of head of pancreas: Secondary | ICD-10-CM

## 2015-12-16 DIAGNOSIS — R634 Abnormal weight loss: Secondary | ICD-10-CM

## 2015-12-16 DIAGNOSIS — C259 Malignant neoplasm of pancreas, unspecified: Secondary | ICD-10-CM

## 2015-12-16 DIAGNOSIS — E119 Type 2 diabetes mellitus without complications: Secondary | ICD-10-CM | POA: Diagnosis not present

## 2015-12-16 LAB — CBC WITH DIFFERENTIAL/PLATELET
BASO%: 0.4 % (ref 0.0–2.0)
Basophils Absolute: 0 10*3/uL (ref 0.0–0.1)
EOS%: 3.7 % (ref 0.0–7.0)
Eosinophils Absolute: 0.1 10*3/uL (ref 0.0–0.5)
HCT: 29.1 % — ABNORMAL LOW (ref 38.4–49.9)
HGB: 10.1 g/dL — ABNORMAL LOW (ref 13.0–17.1)
LYMPH%: 17.4 % (ref 14.0–49.0)
MCH: 30 pg (ref 27.2–33.4)
MCHC: 34.7 g/dL (ref 32.0–36.0)
MCV: 86.4 fL (ref 79.3–98.0)
MONO#: 0.4 10*3/uL (ref 0.1–0.9)
MONO%: 16.5 % — ABNORMAL HIGH (ref 0.0–14.0)
NEUT#: 1.5 10*3/uL (ref 1.5–6.5)
NEUT%: 62 % (ref 39.0–75.0)
Platelets: 170 10*3/uL (ref 140–400)
RBC: 3.37 10*6/uL — AB (ref 4.20–5.82)
RDW: 17.1 % — AB (ref 11.0–14.6)
WBC: 2.4 10*3/uL — AB (ref 4.0–10.3)
lymph#: 0.4 10*3/uL — ABNORMAL LOW (ref 0.9–3.3)

## 2015-12-16 LAB — COMPREHENSIVE METABOLIC PANEL
ALT: 32 U/L (ref 0–55)
ANION GAP: 8 meq/L (ref 3–11)
AST: 36 U/L — AB (ref 5–34)
Albumin: 2.6 g/dL — ABNORMAL LOW (ref 3.5–5.0)
Alkaline Phosphatase: 325 U/L — ABNORMAL HIGH (ref 40–150)
BUN: 17.6 mg/dL (ref 7.0–26.0)
CHLORIDE: 101 meq/L (ref 98–109)
CO2: 19 meq/L — AB (ref 22–29)
CREATININE: 1.5 mg/dL — AB (ref 0.7–1.3)
Calcium: 8.5 mg/dL (ref 8.4–10.4)
EGFR: 44 mL/min/{1.73_m2} — ABNORMAL LOW (ref >90)
Glucose: 104 mg/dl (ref 70–140)
POTASSIUM: 4.3 meq/L (ref 3.5–5.1)
Sodium: 128 mEq/L — ABNORMAL LOW (ref 136–145)
Total Bilirubin: 2.48 mg/dL — ABNORMAL HIGH (ref 0.20–1.20)
Total Protein: 6.8 g/dL (ref 6.4–8.3)

## 2015-12-16 NOTE — Progress Notes (Signed)
Hematology and Oncology Follow Up Visit  Antonio Hancock [id number] 12/30/1940 75 y.o. 12/16/2015 12:00 PM Woody Seller, MDWilson, Jama Flavors, MD   Principle Diagnosis: 75 year old gentleman with locally advanced pancreatic cancer. He presented with 4 cm mass in the head of the pancreas, jaundice and elevated CA-19-9 up to 67. Diagnosed in November of 2016   Prior Therapy: He is status post EUS/FNA that confirmed the presence of adenocarcinoma. He also had an ERCP and stent placement in December 2016.  Current therapy: Chemotherapy with radiation started in January 2017. He is currently receiving daily radiation therapy with Xeloda at 1000 mg daily.  Interim History: Antonio Hancock presents today for a follow-up visit. Since his last visit, he started radiation therapy in the first week of January and have tolerated it with Xeloda fairly well. He did develop grade 1 diarrhea and was instructed to use Imodium but has not done so yet. His appetite is slightly improved and his weight is stable. His pain is reasonably controlled at this time with morphine and breakthrough pain medication of oxycodone. He is ambulating with very little difficulties at this time. His performance status is improving slightly.  He does not report any headaches, blurry vision, syncope or seizures. He does not report any fevers, chills, sweats does report weight loss of appetite changes as mentioned. Does not report any chest pain, palpitation, orthopnea, leg edema. Does not report any cough, wheezing or hemoptysis. Does not report any hematochezia, melena but does report early satiety. Does not report any frequency, urgency or hesitancy. He does report discoloration of the color of his urine. He does not report any skeletal plane such as arthralgias or myalgias. Does not report any lymphadenopathy or petechiae. Remaining review of systems unremarkable.   Medications: I have reviewed the patient's current medications.  Current  Outpatient Prescriptions  Medication Sig Dispense Refill  . ALPRAZolam (XANAX) 0.25 MG tablet Take 0.25 mg by mouth at bedtime.     Marland Kitchen apixaban (ELIQUIS) 5 MG TABS tablet Take 1 tablet (5 mg total) by mouth 2 (two) times daily. 60 tablet 5  . atorvastatin (LIPITOR) 10 MG tablet Take 10 mg by mouth daily.    . capecitabine (XELODA) 500 MG tablet Take one tablet twice a day Monday to Friday with radiation. 50 tablet 0  . metFORMIN (GLUCOPHAGE-XR) 500 MG 24 hr tablet Take 500 mg by mouth daily.     Marland Kitchen morphine (MS CONTIN) 30 MG 12 hr tablet Take 30 mg by mouth every 12 (twelve) hours.  0  . Multiple Vitamins-Minerals (MULTIVITAMINS THER. W/MINERALS) TABS Take 1 tablet by mouth daily.      Marland Kitchen olmesartan (BENICAR) 40 MG tablet Take 40 mg by mouth daily. Reported on 12/12/2015    . oxyCODONE (OXY IR/ROXICODONE) 5 MG immediate release tablet Take 1 tablet (5 mg total) by mouth every 4 (four) hours as needed for moderate pain. 45 tablet 0  . pantoprazole (PROTONIX) 40 MG tablet Take 40 mg by mouth daily.    . promethazine (PHENERGAN) 12.5 MG tablet Take 12.5 mg by mouth every 6 (six) hours as needed for nausea or vomiting.   1   No current facility-administered medications for this visit.     Allergies: No Known Allergies  Past Medical History, Surgical history, Social history, and Family History were reviewed and updated.   Physical Exam: Blood pressure 103/53, pulse 92, temperature 97.7 F (36.5 C), temperature source Oral, resp. rate 18, height 5' 11.5" (1.816 m), weight 155 lb  14.4 oz (70.716 kg), SpO2 100 %. ECOG: 1 General appearance: alert and cooperative chronically ill-appearing but without distress. Head: Normocephalic, without obvious abnormality no oral ulcers or lesions. Sclerae anicteric. Neck: no adenopathy Lymph nodes: Cervical, supraclavicular, and axillary nodes normal. Heart:regular rate and rhythm, S1, S2 normal, no murmur, click, rub or gallop Lung:chest clear, no wheezing,  rales, normal symmetric air entry Abdomin: soft, non-tender, without masses or organomegaly diffusely tender upon deep palpation. No rebound or guarding. EXT:no erythema, induration, or nodules   Lab Results: Lab Results  Component Value Date   WBC 2.4* 12/16/2015   HGB 10.1* 12/16/2015   HCT 29.1* 12/16/2015   MCV 86.4 12/16/2015   PLT 170 12/16/2015     Chemistry      Component Value Date/Time   NA 130* 11/28/2015 1351   NA 127* 10/24/2015 0414   K 5.3* 11/28/2015 1351   K 4.0 10/24/2015 0414   CL 97* 10/24/2015 0414   CO2 20* 11/28/2015 1351   CO2 23 10/24/2015 0414   BUN 29.2* 11/28/2015 1351   BUN 12 10/24/2015 0414   CREATININE 2.0* 11/28/2015 1351   CREATININE 1.06 10/24/2015 0414      Component Value Date/Time   CALCIUM 8.9 11/28/2015 1351   CALCIUM 9.1 10/24/2015 0414   ALKPHOS 270* 11/28/2015 1351   ALKPHOS 486* 10/24/2015 0414   AST 69* 11/28/2015 1351   AST 110* 10/24/2015 0414   ALT 51 11/28/2015 1351   ALT 143* 10/24/2015 0414   BILITOT 4.66* 11/28/2015 1351   BILITOT 8.0* 10/24/2015 0414       Impression and Plan:  75 year old gentleman with the following issues:  1. Pancreatic cancer diagnosed in November 2016 after presenting with a 4 cm mass at the head of the pancreas with pancreatic ductal dilation. He underwent ERCP as well as EUS/FNA which showed no evidence of vascular invasion or lymphadenopathy and biopsy-proven to be adenocarcinoma with a CA-19-9 of 67. Rest of history workup did not reveal any metastatic disease to the liver or the lung. He has been evaluated for surgical resection and felt that he might be a borderline candidate.  He is currently receiving  Xeloda with radiation therapy and have tolerated it well. He did have some mild diarrhea but otherwise no other complications. The plan is to continue with the current dose and schedule until he completes chemoradiation. Systemic chemotherapy would be used if no surgical resection is  planned.  2. Abdominal pain: He is currently on long-acting morphine twice a day with breakthrough oxycodone and seems to be controlling his pain for the time being. No changes needed at this time.  3. Jaundice: Status post ERCP and stent placement in December 2016. His jaundice appears to be improving and his bilirubin will be repeated today. It has decreased from 8.0 to 4.66 between November and December 2016.  4. Weight loss: Appears to be stable at this time but we'll continue to monitor him regarding this issue. He is at risk of developing malnutrition.  5. Thrombosis prophylaxis: His risk of thrombosis will be high given the pancreatic malignancy. He is currently on Eliquis for cardiac reasons but  will decrease his risk certainly.  6. Hypotension: His blood pressure is borderline I asked him to hold his amlodipine. His blood pressure much improved at this time.  7. Diabetes: Given his massive weight loss he might no longer need his diabetic medication moving forward. I will defer that to Dr. Redmond Pulling regarding this issue.  8.  Prognosis: He understands that he is a very difficult malignancy with poor prognosis regardless of the treatment modality. The chances of long term disease control is rather small given the limitation in his health to undergo aggressive therapy.   9. Follow-up: Will be in 2 weeks to continue follow his progress.   Zola Button, MD 1/10/201712:00 PM

## 2015-12-16 NOTE — Telephone Encounter (Signed)
Talked to patient here in office. Scheduled appt.       [facility]. °

## 2015-12-17 ENCOUNTER — Other Ambulatory Visit: Payer: Self-pay | Admitting: *Deleted

## 2015-12-17 ENCOUNTER — Ambulatory Visit
Admission: RE | Admit: 2015-12-17 | Discharge: 2015-12-17 | Disposition: A | Discharge status: Home / Self Care | Payer: BLUE CROSS/BLUE SHIELD | Source: Ambulatory Visit | Attending: Radiation Oncology | Admitting: Radiation Oncology

## 2015-12-17 DIAGNOSIS — Z510 Encounter for antineoplastic radiation therapy: Secondary | ICD-10-CM | POA: Diagnosis not present

## 2015-12-18 ENCOUNTER — Other Ambulatory Visit: Payer: Self-pay | Admitting: Oncology

## 2015-12-18 ENCOUNTER — Ambulatory Visit
Admission: RE | Admit: 2015-12-18 | Discharge: 2015-12-18 | Disposition: A | Discharge status: Home / Self Care | Payer: BLUE CROSS/BLUE SHIELD | Source: Ambulatory Visit | Attending: Radiation Oncology | Admitting: Radiation Oncology

## 2015-12-18 DIAGNOSIS — C259 Malignant neoplasm of pancreas, unspecified: Secondary | ICD-10-CM

## 2015-12-18 DIAGNOSIS — Z510 Encounter for antineoplastic radiation therapy: Secondary | ICD-10-CM | POA: Diagnosis not present

## 2015-12-18 MED ORDER — PROMETHAZINE HCL 12.5 MG PO TABS: 12.5000 mg | ORAL_TABLET | Freq: Four times a day (QID) | ORAL | Status: DC | PRN

## 2015-12-19 ENCOUNTER — Encounter: Payer: Self-pay | Admitting: Internal Medicine

## 2015-12-19 ENCOUNTER — Encounter: Payer: Self-pay | Admitting: Pharmacist

## 2015-12-19 ENCOUNTER — Ambulatory Visit
Admission: RE | Admit: 2015-12-19 | Discharge: 2015-12-19 | Disposition: A | Discharge status: Home / Self Care | Payer: BLUE CROSS/BLUE SHIELD | Source: Ambulatory Visit | Attending: Radiation Oncology | Admitting: Radiation Oncology

## 2015-12-19 ENCOUNTER — Encounter: Payer: Self-pay | Admitting: Radiation Oncology

## 2015-12-19 VITALS — BP 109/55 | HR 74 | Temp 97.7°F | Resp 20 | Wt 157.2 lb

## 2015-12-19 DIAGNOSIS — Z510 Encounter for antineoplastic radiation therapy: Secondary | ICD-10-CM | POA: Diagnosis not present

## 2015-12-19 DIAGNOSIS — C250 Malignant neoplasm of head of pancreas: Secondary | ICD-10-CM

## 2015-12-19 NOTE — Progress Notes (Addendum)
Weekly rad  txs abdomen, takes xeloda bid after meals,  Distended  right lower labdomen, pain  There, none at present after pain med taken, nausea comes and goes, has gained 1 lb this week, fatigued, eyes look yellow labs 12/16/15, bilirubin down from 4.66 to 2.48, BP 109/55 mmHg  Pulse 74  Temp(Src) 97.7 F (36.5 C) (Oral)  Resp 20  Wt 157 lb 3.2 oz (71.305 kg)  Wt Readings from Last 3 Encounters:  12/19/15 157 lb 3.2 oz (71.305 kg)  12/16/15 155 lb 14.4 oz (70.716 kg)  12/12/15 154 lb 6.4 oz (70.035 kg)

## 2015-12-19 NOTE — Progress Notes (Signed)
Oral Chemotherapy Follow-Up Form  Original Start date of oral chemotherapy: 1/5/2017__   Called patient today to follow up regarding patient's oral chemotherapy medication: _Xeloda + radiation___  Pt is doing well today. He has recently had some nausea/vomiting the last day. He has phenergan prescription and he is taking this and says it helps. He will let us know if nausea continues and may need to adjust anti-emetic regimen. Instructed Antonio Hancock to take the phenergan 12.5 mg every 6 hours for the next few days.   Pt reports _0_ tablets/doses missed in the last week/month.    Pt reports the following side effects: _Nasuea/Vomting_____    Will follow up and call patient again in _1 week___   Thank you,  Montel Clock, PharmD, [facility]

## 2015-12-19 NOTE — Progress Notes (Signed)
Department of Radiation Oncology  Phone:  [phone] Fax:        [phone]  Weekly Treatment Note    Name: Antonio Hancock Date: 12/19/2015 MRN: [id number] DOB: 06-03-1941   Diagnosis:     ICD-9-CM ICD-10-CM   1. 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 ALPRAZolam (XANAX) 0.25 MG tablet Take 0.25 mg by mouth at bedtime.     Marland Kitchen apixaban (ELIQUIS) 5 MG TABS tablet Take 1 tablet (5 mg total) by mouth 2 (two) times daily. 60 tablet 5  . atorvastatin (LIPITOR) 10 MG tablet Take 10 mg by mouth daily.    . capecitabine (XELODA) 500 MG tablet Take one tablet twice a day Monday to Friday with radiation. 50 tablet 0  . metFORMIN (GLUCOPHAGE-XR) 500 MG 24 hr tablet Take 500 mg by mouth daily.     Marland Kitchen morphine (MS CONTIN) 30 MG 12 hr tablet Take 30 mg by mouth every 12 (twelve) hours.  0  . Multiple Vitamins-Minerals (MULTIVITAMINS THER. W/MINERALS) TABS Take 1 tablet by mouth daily.      Marland Kitchen olmesartan (BENICAR) 40 MG tablet Take 40 mg by mouth daily. Reported on 12/12/2015    . oxyCODONE (OXY IR/ROXICODONE) 5 MG immediate release tablet Take 1 tablet (5 mg total) by mouth every 4 (four) hours as needed for moderate pain. 45 tablet 0  . pantoprazole (PROTONIX) 40 MG tablet Take 40 mg by mouth daily.    . promethazine (PHENERGAN) 12.5 MG tablet Take 1 tablet (12.5 mg total) by mouth every 6 (six) hours as needed for nausea or vomiting. 30 tablet 3   No current facility-administered medications for this encounter.     ALLERGIES: Review of patient's allergies indicates no known allergies.   LABORATORY DATA:  Lab Results  Component Value Date   WBC 2.4* 12/16/2015   HGB 10.1* 12/16/2015   HCT 29.1* 12/16/2015   MCV 86.4 12/16/2015   PLT 170 12/16/2015   Lab Results  Component Value Date   NA 128* 12/16/2015   K 4.3 12/16/2015   CL 97* 10/24/2015   CO2 19* 12/16/2015   Lab Results  Component Value Date   ALT 32 12/16/2015   AST 36* 12/16/2015   ALKPHOS 325* 12/16/2015   BILITOT 2.48* 12/16/2015     NARRATIVE: Antonio Hancock was seen today for weekly treatment management. The chart was checked and the patient's films were reviewed.  Weekly rad  txs abdomen, takes xeloda bid after meals,  Distended  right lower labdomen, pain  There, none at present after pain med taken, nausea comes and goes, has gained 1 lb this week, fatigued, eyes look yellow labs 12/16/15, bilirubin down from 4.66 to 2.48, BP 109/55 mmHg  Pulse 74  Temp(Src) 97.7 F (36.5 C) (Oral)  Resp 20  Wt 157 lb 3.2 oz (71.305 kg)  Wt Readings from Last 3 Encounters:  12/19/15 157 lb 3.2 oz (71.305 kg)  12/16/15 155 lb 14.4 oz (70.716 kg)  12/12/15 154 lb 6.4 oz (70.035 kg)    PHYSICAL EXAMINATION: weight is 157 lb 3.2 oz (71.305 kg). His oral temperature is 97.7 F (36.5 C). His blood pressure is 109/55 and his pulse is 74. His respiration is 20.        ASSESSMENT: The patient is doing satisfactorily with treatment. The patient's loose stools are  well controlled with Imodium. The patient has had some abdominal distention without any weight change or other difficulties. We will have physics evaluate his current plan in light of this change which appears to be stable versus his simulation scan.  PLAN: We will continue with the patient's radiation treatment as planned.

## 2015-12-21 NOTE — Addendum Note (Signed)
Encounter addended by: Kyung Rudd, MD on: 12/21/2015  2:54 PM<BR>     Documentation filed: Notes Section

## 2015-12-22 ENCOUNTER — Ambulatory Visit
Admission: RE | Admit: 2015-12-22 | Discharge: 2015-12-22 | Disposition: A | Discharge status: Home / Self Care | Payer: BLUE CROSS/BLUE SHIELD | Source: Ambulatory Visit | Attending: Radiation Oncology | Admitting: Radiation Oncology

## 2015-12-22 DIAGNOSIS — Z510 Encounter for antineoplastic radiation therapy: Secondary | ICD-10-CM | POA: Diagnosis not present

## 2015-12-23 ENCOUNTER — Ambulatory Visit
Admission: RE | Admit: 2015-12-23 | Discharge: 2015-12-23 | Disposition: A | Discharge status: Home / Self Care | Payer: BLUE CROSS/BLUE SHIELD | Source: Ambulatory Visit | Attending: Radiation Oncology | Admitting: Radiation Oncology

## 2015-12-23 DIAGNOSIS — Z510 Encounter for antineoplastic radiation therapy: Secondary | ICD-10-CM | POA: Diagnosis not present

## 2015-12-24 ENCOUNTER — Encounter: Payer: Self-pay | Admitting: Radiation Oncology

## 2015-12-24 ENCOUNTER — Ambulatory Visit
Admission: RE | Admit: 2015-12-24 | Discharge: 2015-12-24 | Disposition: A | Discharge status: Home / Self Care | Payer: BLUE CROSS/BLUE SHIELD | Source: Ambulatory Visit | Attending: Radiation Oncology | Admitting: Radiation Oncology

## 2015-12-24 DIAGNOSIS — Z510 Encounter for antineoplastic radiation therapy: Secondary | ICD-10-CM | POA: Diagnosis not present

## 2015-12-25 ENCOUNTER — Ambulatory Visit
Admission: RE | Admit: 2015-12-25 | Discharge: 2015-12-25 | Disposition: A | Discharge status: Home / Self Care | Payer: BLUE CROSS/BLUE SHIELD | Source: Ambulatory Visit | Attending: Radiation Oncology | Admitting: Radiation Oncology

## 2015-12-25 DIAGNOSIS — Z510 Encounter for antineoplastic radiation therapy: Secondary | ICD-10-CM | POA: Diagnosis not present

## 2015-12-26 ENCOUNTER — Ambulatory Visit
Admission: RE | Admit: 2015-12-26 | Discharge: 2015-12-26 | Disposition: A | Discharge status: Home / Self Care | Payer: BLUE CROSS/BLUE SHIELD | Source: Ambulatory Visit | Attending: Radiation Oncology | Admitting: Radiation Oncology

## 2015-12-26 ENCOUNTER — Encounter: Payer: Self-pay | Admitting: Radiation Oncology

## 2015-12-26 VITALS — BP 108/63 | HR 77 | Temp 97.6°F | Resp 20 | Wt 153.6 lb

## 2015-12-26 DIAGNOSIS — Z510 Encounter for antineoplastic radiation therapy: Secondary | ICD-10-CM | POA: Diagnosis not present

## 2015-12-26 DIAGNOSIS — C250 Malignant neoplasm of head of pancreas: Secondary | ICD-10-CM

## 2015-12-26 MED ORDER — ONDANSETRON HCL 8 MG PO TABS: 8.0000 mg | ORAL_TABLET | Freq: Three times a day (TID) | ORAL | Status: DC | PRN

## 2015-12-26 MED ORDER — OXYCODONE HCL 5 MG PO TABS: 5.0000 mg | ORAL_TABLET | ORAL | Status: DC | PRN

## 2015-12-26 NOTE — Addendum Note (Signed)
Encounter addended by: Kyung Rudd, MD on: 12/26/2015  5:29 PM<BR>     Documentation filed: Notes Section, Visit Diagnoses

## 2015-12-26 NOTE — Progress Notes (Signed)
Weekly rad txs pancreas 12/28 completed, c/o throwing up after 3 hours  Of eating, says, his abdomen still swollen, has lost 4 lbs since last week, denys pain at present,  [facility], has hearing aids in, b/p high, stoped taking his b/p medication will take it when he gets home, has diarrhea and takes imodium,, throws up gator ade also BP 148/130 mmHg  Pulse 86  Temp(Src) 97.6 F (36.4 C) (Oral)  Resp 20  Wt 153 lb 9.6 oz (69.673 kg)  Wt Readings from Last 3 Encounters:  12/26/15 153 lb 9.6 oz (69.673 kg)  12/19/15 157 lb 3.2 oz (71.305 kg)  12/16/15 155 lb 14.4 oz (70.716 kg)

## 2015-12-26 NOTE — Progress Notes (Signed)
Department of Radiation Oncology  Phone:  [phone] Fax:        [phone]  Weekly Treatment Note    Name: Antonio Hancock Date: 12/26/2015 MRN: [id number] DOB: Mar 09, 1941   Diagnosis:     ICD-9-CM ICD-10-CM   1. 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 ALPRAZolam (XANAX) 0.25 MG tablet Take 0.25 mg by mouth at bedtime.     Marland Kitchen apixaban (ELIQUIS) 5 MG TABS tablet Take 1 tablet (5 mg total) by mouth 2 (two) times daily. 60 tablet 5  . atorvastatin (LIPITOR) 10 MG tablet Take 10 mg by mouth daily.    . capecitabine (XELODA) 500 MG tablet Take one tablet twice a day Monday to Friday with radiation. 50 tablet 0  . metFORMIN (GLUCOPHAGE-XR) 500 MG 24 hr tablet Take 500 mg by mouth daily.     Marland Kitchen morphine (MS CONTIN) 30 MG 12 hr tablet Take 30 mg by mouth every 12 (twelve) hours.  0  . olmesartan (BENICAR) 40 MG tablet Take 40 mg by mouth daily. Reported on 12/12/2015    . oxyCODONE (OXY IR/ROXICODONE) 5 MG immediate release tablet Take 1 tablet (5 mg total) by mouth every 4 (four) hours as needed for moderate pain. 60 tablet 0  . pantoprazole (PROTONIX) 40 MG tablet Take 40 mg by mouth daily.    . promethazine (PHENERGAN) 12.5 MG tablet Take 1 tablet (12.5 mg total) by mouth every 6 (six) hours as needed for nausea or vomiting. 30 tablet 3  . Multiple Vitamins-Minerals (MULTIVITAMINS THER. W/MINERALS) TABS Take 1 tablet by mouth daily. Reported on 12/26/2015    . ondansetron (ZOFRAN) 8 MG tablet Take 1 tablet (8 mg total) by mouth every 8 (eight) hours as needed for nausea or vomiting. 30 tablet 1   No current facility-administered medications for this encounter.     ALLERGIES: Review of patient's allergies indicates no known allergies.   LABORATORY DATA:  Lab Results  Component Value Date   WBC 2.4* 12/16/2015   HGB 10.1*  12/16/2015   HCT 29.1* 12/16/2015   MCV 86.4 12/16/2015   PLT 170 12/16/2015   Lab Results  Component Value Date   NA 128* 12/16/2015   K 4.3 12/16/2015   CL 97* 10/24/2015   CO2 19* 12/16/2015   Lab Results  Component Value Date   ALT 32 12/16/2015   AST 36* 12/16/2015   ALKPHOS 325* 12/16/2015   BILITOT 2.48* 12/16/2015     NARRATIVE: Antonio Hancock was seen today for weekly treatment management. The chart was checked and the patient's films were reviewed.  He complains of throwing up 3 hours after eating. He reports his abdomen still swollen. He lost 4 lbs since last week. Denies pain at this time. He has diarrhea and takes imodium. He also throws up gatorade. He reports the phenergan doesn't work well. He also ran out of Oxicodone 5 mg IR.  Current Outpatient Prescriptions on File Prior to Encounter  Medication Sig Dispense Refill  . ALPRAZolam (XANAX) 0.25 MG tablet Take 0.25 mg by mouth at bedtime.     Marland Kitchen apixaban (ELIQUIS) 5 MG TABS tablet Take 1 tablet (5 mg total) by mouth 2 (two) times daily. 60 tablet 5  . atorvastatin (LIPITOR) 10 MG tablet Take 10 mg by mouth daily.    . capecitabine (XELODA) 500  MG tablet Take one tablet twice a day Monday to Friday with radiation. 50 tablet 0  . metFORMIN (GLUCOPHAGE-XR) 500 MG 24 hr tablet Take 500 mg by mouth daily.     Marland Kitchen morphine (MS CONTIN) 30 MG 12 hr tablet Take 30 mg by mouth every 12 (twelve) hours.  0  . olmesartan (BENICAR) 40 MG tablet Take 40 mg by mouth daily. Reported on 12/12/2015    . pantoprazole (PROTONIX) 40 MG tablet Take 40 mg by mouth daily.    . promethazine (PHENERGAN) 12.5 MG tablet Take 1 tablet (12.5 mg total) by mouth every 6 (six) hours as needed for nausea or vomiting. 30 tablet 3  . Multiple Vitamins-Minerals (MULTIVITAMINS THER. W/MINERALS) TABS Take 1 tablet by mouth daily. Reported on 12/26/2015     No current facility-administered medications on file prior to encounter.      BP 108/63 mmHg   Pulse 77  Temp(Src) 97.6 F (36.4 C) (Oral)  Resp 20  Wt 153 lb 9.6 oz (69.673 kg)  Wt Readings from Last 3 Encounters:  12/26/15 153 lb 9.6 oz (69.673 kg)  12/19/15 157 lb 3.2 oz (71.305 kg)  12/16/15 155 lb 14.4 oz (70.716 kg)    PHYSICAL EXAMINATION: weight is 153 lb 9.6 oz (69.673 kg). His oral temperature is 97.6 F (36.4 C). His blood pressure is 108/63 and his pulse is 77. His respiration is 20.        ASSESSMENT: The patient is doing satisfactorily with treatment.  PLAN: We will continue with the patient's radiation treatment as planned. I refilled his Oxicodone 5mg  IR. I filled Zofran 8 mg for his nausea. If needed, he could take the Zofran and Phenergan.  This document serves as a record of services personally performed by Kyung Rudd, MD. It was created on his behalf by Darcus Austin, a trained medical scribe. The creation of this record is based on the scribe's personal observations and the provider's statements to them. This document has been checked and approved by the attending provider.

## 2015-12-26 NOTE — Progress Notes (Signed)
  Radiation Oncology         [phone] ________________________________  Name: Antonio Hancock MRN: [id number]  Date: 12/24/2015  DOB: February 15, 1941  SIMULATION AND TREATMENT PLANNING NOTE  DIAGNOSIS:   Pancreatic cancer  Site:  Abdomen  NARRATIVE:  The patient has experienced some swelling in the abdominal region of uncertain etiology. This has been consistent. An analysis reveals that this will impact the patient's treatment and therefore another simulation is necessary. The patient therefore has undergone a second simulation such that the treatment plan can be altered given the change in the patient's anatomy.  Identity was confirmed.  All relevant records and images related to the planned course of therapy were reviewed.   Written consent to proceed with treatment was confirmed which was freely given after reviewing the details related to the planned course of therapy had been reviewed with the patient.  Then, the patient was set-up in a stable reproducible  supine position for radiation therapy.  CT images were obtained.  Surface markings were placed.    The CT images were loaded into the planning software.  Then the target and avoidance structures were contoured.  Treatment planning then occurred.  The radiation prescription was entered and confirmed.  A total of 6 complex treatment devices were fabricated which relate to the designed radiation treatment fields. Each of these customized fields/ complex treatment devices will be used on a daily basis during the radiation course. I have requested : Isodose Plan.   PLAN:  The patient will receive 45 Gy in 25 fractions followed by a 5.4 gray boost..  ________________________________   Jodelle Gross, MD, PhD

## 2015-12-26 NOTE — Progress Notes (Signed)
  Radiation Oncology         [phone] ________________________________  Name: Antonio Hancock MRN: [id number]  Date: 11/28/2015  DOB: 1941-01-15  SIMULATION AND TREATMENT PLANNING NOTE  DIAGNOSIS:     ICD-9-CM ICD-10-CM   1. Malignant neoplasm of head of pancreas ([place]) 157.0 C25.0      Site:  Abdomen  NARRATIVE:  The patient was brought to the [facility].  Identity was confirmed.  All relevant records and images related to the planned course of therapy were reviewed.   Written consent to proceed with treatment was confirmed which was freely given after reviewing the details related to the planned course of therapy had been reviewed with the patient.  Then, the patient was set-up in a stable reproducible  supine position for radiation therapy.  CT images were obtained.  Surface markings were placed.    Medically necessary complex treatment device(s) for immobilization:  Customized vac lock bag.   The CT images were loaded into the planning software.  Then the target and avoidance structures were contoured.  Treatment planning then occurred.  The radiation prescription was entered and confirmed.  A total of 6 complex treatment devices were fabricated which relate to the designed radiation treatment fields based on the patient's sinogram from the tomotherapy unit. Each of these customized fields/ complex treatment devices will be used on a daily basis during the radiation course. I have requested : 3D Simulation  I have requested a DVH of the following structures: Target volume, spinal cord, left kidney, right kidney, liver.   The patient will undergo daily image guidance to ensure accurate localization of the target, and adequate minimize dose to the normal surrounding structures in close proximity to the target.   PLAN:  The patient will receive 45 Gy in 25 fractions initially. The patient then will receive a 5.4 gray boost to yield a final total dose of 50.4  gray.  Special treatment procedure The patient will also receive concurrent chemotherapy during the treatment. The patient may therefore experience increased toxicity or side effects and the patient will be monitored for such problems. This may require extra lab work as necessary. This therefore constitutes a special treatment procedure.   ________________________________   Jodelle Gross, MD, PhD

## 2015-12-29 ENCOUNTER — Encounter: Payer: Self-pay | Admitting: Pharmacist

## 2015-12-29 ENCOUNTER — Ambulatory Visit
Admission: RE | Admit: 2015-12-29 | Discharge: 2015-12-29 | Disposition: A | Discharge status: Home / Self Care | Payer: BLUE CROSS/BLUE SHIELD | Source: Ambulatory Visit | Attending: Radiation Oncology | Admitting: Radiation Oncology

## 2015-12-29 DIAGNOSIS — Z510 Encounter for antineoplastic radiation therapy: Secondary | ICD-10-CM | POA: Diagnosis not present

## 2015-12-29 NOTE — Progress Notes (Signed)
Oral Chemotherapy Follow-Up Form  Original Start date of oral chemotherapy: _1/5/17_   Called patient today to follow up regarding patient's oral chemotherapy medication: _Xeloda + radiation___  Pt is doing well today. He has had some issues with nausea and diarrhea. He has prescriptions for zofran and phenergan and is taking these if needed. They are helping. He states he had 4 loose stools yesterday and took 1 tablet of imodium and has not had any diarrhea since. Counseled patient that if diarrhea continues he can take 4 mg of imodium followed by 2 mg every 4 hours or after each loose stool (max 16 mg per 24 hours). Patient voiced understanding. Will continue to monitor this and patient will call with any issues  Pt reports __0__ tablets/doses missed in the last week/month.    Pt reports the following side effects: _Nausea/diarrhea___     Will follow up and call patient again in __2 weeks___   Thank you,  Montel Clock, PharmD, [facility]

## 2015-12-30 ENCOUNTER — Other Ambulatory Visit (HOSPITAL_BASED_OUTPATIENT_CLINIC_OR_DEPARTMENT_OTHER): Payer: BLUE CROSS/BLUE SHIELD

## 2015-12-30 ENCOUNTER — Telehealth: Payer: Self-pay | Admitting: Oncology

## 2015-12-30 ENCOUNTER — Ambulatory Visit (HOSPITAL_BASED_OUTPATIENT_CLINIC_OR_DEPARTMENT_OTHER): Payer: BLUE CROSS/BLUE SHIELD | Admitting: Oncology

## 2015-12-30 ENCOUNTER — Ambulatory Visit
Admission: RE | Admit: 2015-12-30 | Discharge: 2015-12-30 | Disposition: A | Discharge status: Home / Self Care | Payer: BLUE CROSS/BLUE SHIELD | Source: Ambulatory Visit | Attending: Radiation Oncology | Admitting: Radiation Oncology

## 2015-12-30 VITALS — BP 83/43 | HR 70 | Temp 97.9°F | Resp 18 | Wt 150.9 lb

## 2015-12-30 DIAGNOSIS — C250 Malignant neoplasm of head of pancreas: Secondary | ICD-10-CM

## 2015-12-30 DIAGNOSIS — I959 Hypotension, unspecified: Secondary | ICD-10-CM | POA: Diagnosis not present

## 2015-12-30 DIAGNOSIS — E119 Type 2 diabetes mellitus without complications: Secondary | ICD-10-CM | POA: Diagnosis not present

## 2015-12-30 DIAGNOSIS — Z510 Encounter for antineoplastic radiation therapy: Secondary | ICD-10-CM | POA: Diagnosis not present

## 2015-12-30 LAB — CBC WITH DIFFERENTIAL/PLATELET
BASO%: 0 % (ref 0.0–2.0)
Basophils Absolute: 0 10*3/uL (ref 0.0–0.1)
EOS ABS: 0.1 10*3/uL (ref 0.0–0.5)
EOS%: 4.4 % (ref 0.0–7.0)
HEMATOCRIT: 29.7 % — AB (ref 38.4–49.9)
HGB: 10.4 g/dL — ABNORMAL LOW (ref 13.0–17.1)
LYMPH#: 0.2 10*3/uL — AB (ref 0.9–3.3)
LYMPH%: 10.9 % — AB (ref 14.0–49.0)
MCH: 30.4 pg (ref 27.2–33.4)
MCHC: 35 g/dL (ref 32.0–36.0)
MCV: 86.8 fL (ref 79.3–98.0)
MONO#: 0.2 10*3/uL (ref 0.1–0.9)
MONO%: 10.9 % (ref 0.0–14.0)
NEUT%: 73.8 % (ref 39.0–75.0)
NEUTROS ABS: 1.4 10*3/uL — AB (ref 1.5–6.5)
PLATELETS: 156 10*3/uL (ref 140–400)
RBC: 3.42 10*6/uL — ABNORMAL LOW (ref 4.20–5.82)
RDW: 16.9 % — ABNORMAL HIGH (ref 11.0–14.6)
WBC: 1.8 10*3/uL — ABNORMAL LOW (ref 4.0–10.3)

## 2015-12-30 LAB — COMPREHENSIVE METABOLIC PANEL
ALK PHOS: 161 U/L — AB (ref 40–150)
ALT: 17 U/L (ref 0–55)
ANION GAP: 8 meq/L (ref 3–11)
AST: 28 U/L (ref 5–34)
Albumin: 2.6 g/dL — ABNORMAL LOW (ref 3.5–5.0)
BILIRUBIN TOTAL: 1.71 mg/dL — AB (ref 0.20–1.20)
BUN: 19.7 mg/dL (ref 7.0–26.0)
CALCIUM: 8.2 mg/dL — AB (ref 8.4–10.4)
CO2: 19 mEq/L — ABNORMAL LOW (ref 22–29)
CREATININE: 1.5 mg/dL — AB (ref 0.7–1.3)
Chloride: 100 mEq/L (ref 98–109)
EGFR: 44 mL/min/{1.73_m2} — AB (ref >90)
Glucose: 86 mg/dl (ref 70–140)
Potassium: 4.4 mEq/L (ref 3.5–5.1)
Sodium: 127 mEq/L — ABNORMAL LOW (ref 136–145)
TOTAL PROTEIN: 6.5 g/dL (ref 6.4–8.3)

## 2015-12-30 MED ORDER — MEGESTROL ACETATE 400 MG/10ML PO SUSP: 400.0000 mg | Freq: Two times a day (BID) | ORAL | Status: AC

## 2015-12-30 NOTE — Telephone Encounter (Signed)
Pt confirmed labs/ov per 01/24 POF, gave pt AVS and Calendar... KJ °

## 2015-12-30 NOTE — Progress Notes (Signed)
Hematology and Oncology Follow Up Visit  Antonio Hancock [id number] Apr 14, 1941 75 y.o. 12/30/2015 11:54 AM Antonio Hancock, MDWilson, Jama Flavors, MD   Principle Diagnosis: 75 year old gentleman with locally advanced pancreatic cancer. He presented with 4 cm mass in the head of the pancreas, jaundice and elevated CA-19-9 up to 67. Diagnosed in November of 2016   Prior Therapy: He is status post EUS/FNA that confirmed the presence of adenocarcinoma. He also had an ERCP and stent placement in December 2016.  Current therapy: Chemotherapy with radiation started in January 2017. He is currently receiving daily radiation therapy with Xeloda at 1000 mg daily.  Interim History: Antonio Hancock presents today for a follow-up visit. Since his last visit, he reports feeling complaints related to therapy. He reports some nausea and occasional diarrhea and lost 5 pounds since the last visit. He also has stopped all his blood pressure medication given his hypotension. He also reports that his pain is much improved with long-acting morphine. His breakthrough pain medication is limited at this time.  He still able to ambulate without any major difficulties. Has not reported any falls or syncope. He reports of Xeloda has been well tolerated with very few side effects.   He does not report any headaches, blurry vision, syncope or seizures. He does not report any fevers, chills, sweats does report weight loss of appetite changes as mentioned. Does not report any chest pain, palpitation, orthopnea, leg edema. Does not report any cough, wheezing or hemoptysis. Does not report any hematochezia, melena but does report early satiety. Does not report any frequency, urgency or hesitancy. He does report discoloration of the color of his urine. He does not report any skeletal plane such as arthralgias or myalgias. Does not report any lymphadenopathy or petechiae. Remaining review of systems unremarkable.   Medications: I have  reviewed the patient's current medications.  Current Outpatient Prescriptions  Medication Sig Dispense Refill  . ALPRAZolam (XANAX) 0.25 MG tablet Take 0.25 mg by mouth at bedtime.     Marland Kitchen apixaban (ELIQUIS) 5 MG TABS tablet Take 1 tablet (5 mg total) by mouth 2 (two) times daily. 60 tablet 5  . atorvastatin (LIPITOR) 10 MG tablet Take 10 mg by mouth daily.    . capecitabine (XELODA) 500 MG tablet Take one tablet twice a day Monday to Friday with radiation. 50 tablet 0  . metFORMIN (GLUCOPHAGE-XR) 500 MG 24 hr tablet Take 500 mg by mouth daily.     Marland Kitchen morphine (MS CONTIN) 30 MG 12 hr tablet Take 30 mg by mouth every 12 (twelve) hours.  0  . Multiple Vitamins-Minerals (MULTIVITAMINS THER. W/MINERALS) TABS Take 1 tablet by mouth daily. Reported on 12/26/2015    . olmesartan (BENICAR) 40 MG tablet Take 40 mg by mouth daily. Reported on 12/12/2015    . ondansetron (ZOFRAN) 8 MG tablet Take 1 tablet (8 mg total) by mouth every 8 (eight) hours as needed for nausea or vomiting. 30 tablet 1  . oxyCODONE (OXY IR/ROXICODONE) 5 MG immediate release tablet Take 1 tablet (5 mg total) by mouth every 4 (four) hours as needed for moderate pain. 60 tablet 0  . pantoprazole (PROTONIX) 40 MG tablet Take 40 mg by mouth daily.    . promethazine (PHENERGAN) 12.5 MG tablet Take 1 tablet (12.5 mg total) by mouth every 6 (six) hours as needed for nausea or vomiting. 30 tablet 3  . megestrol (MEGACE) 400 MG/10ML suspension Take 10 mLs (400 mg total) by mouth 2 (two) times daily. [place]  mL 0   No current facility-administered medications for this visit.     Allergies: No Known Allergies  Past Medical History, Surgical history, Social history, and Family History were reviewed and updated.   Physical Exam: Blood pressure 83/43, pulse 70, temperature 97.9 F (36.6 C), temperature source Oral, resp. rate 18, weight 150 lb 14.4 oz (68.448 kg), SpO2 100 %. ECOG: 1 General appearance: alert and cooperative chronically  ill-appearing appeared comfortable. Head: Normocephalic, without obvious abnormality. Sclerae anicteric. Neck: no adenopathy Lymph nodes: Cervical, supraclavicular, and axillary nodes normal. Heart:regular rate and rhythm, S1, S2 normal, no murmur, click, rub or gallop Lung:chest clear, no wheezing, rales, normal symmetric air entry Abdomin: soft, non-tender, without masses or organomegaly no rebound or guarding. EXT:no erythema, induration, or nodules   Lab Results: Lab Results  Component Value Date   WBC 1.8* 12/30/2015   HGB 10.4* 12/30/2015   HCT 29.7* 12/30/2015   MCV 86.8 12/30/2015   PLT 156 12/30/2015     Chemistry      Component Value Date/Time   NA 128* 12/16/2015 1120   NA 127* 10/24/2015 0414   K 4.3 12/16/2015 1120   K 4.0 10/24/2015 0414   CL 97* 10/24/2015 0414   CO2 19* 12/16/2015 1120   CO2 23 10/24/2015 0414   BUN 17.6 12/16/2015 1120   BUN 12 10/24/2015 0414   CREATININE 1.5* 12/16/2015 1120   CREATININE 1.06 10/24/2015 0414      Component Value Date/Time   CALCIUM 8.5 12/16/2015 1120   CALCIUM 9.1 10/24/2015 0414   ALKPHOS 325* 12/16/2015 1120   ALKPHOS 486* 10/24/2015 0414   AST 36* 12/16/2015 1120   AST 110* 10/24/2015 0414   ALT 32 12/16/2015 1120   ALT 143* 10/24/2015 0414   BILITOT 2.48* 12/16/2015 1120   BILITOT 8.0* 10/24/2015 0414       Impression and Plan:  75 year old gentleman with the following issues:  1. Pancreatic cancer diagnosed in November 2016 after presenting with a 4 cm mass at the head of the pancreas with pancreatic ductal dilation. He underwent ERCP as well as EUS/FNA which showed no evidence of vascular invasion or lymphadenopathy and biopsy-proven to be adenocarcinoma with a CA-19-9 of 67. Rest of history workup did not reveal any metastatic disease to the liver or the lung. He has been evaluated for surgical resection and felt that he might be a borderline candidate.  He is currently receiving  Xeloda with radiation  therapy and have tolerated it well. He did have some mild diarrhea and nausea but for the most part therapy have been well-tolerated. The plan is to finish his therapy and repeat imaging studies in 4-6 weeks upon the conclusion of radiation.  2. Abdominal pain: He is currently on long-acting morphine twice a day with breakthrough oxycodone and seems to be controlling his pain for the time being. No changes needed at this time.  3. Jaundice: Status post ERCP and stent placement in December 2016. His bilirubin have been declining and his jaundice clinically appear to have resolved.  4. Weight loss: I counseled him about ways to boost his nutritional intake. Prescription for Megace have been: To his pharmacy with instructions how to use it.  5. Thrombosis prophylaxis: His risk of thrombosis will be high given the pancreatic malignancy. He is currently on Eliquis for cardiac reasons but  will decrease his risk certainly.  6. Hypotension: His blood pressure continues to be low iron and instructed him to stop all his blood  pressure medication at this time.  7. Diabetes: Given his massive weight loss he might no longer need his diabetic medication moving forward. I will defer that to Dr. Redmond Pulling regarding this issue.  8. Prognosis: He understands that he is a very difficult malignancy with poor prognosis regardless of the treatment modality. The chances of long term disease control is rather small given the limitation in his health to undergo aggressive therapy.   9. Follow-up: Will be in 2 weeks to continue follow his progress.   [facility], MD 1/24/201711:54 AM

## 2015-12-31 ENCOUNTER — Ambulatory Visit
Admission: RE | Admit: 2015-12-31 | Discharge: 2015-12-31 | Disposition: A | Discharge status: Home / Self Care | Payer: BLUE CROSS/BLUE SHIELD | Source: Ambulatory Visit | Attending: Radiation Oncology | Admitting: Radiation Oncology

## 2015-12-31 DIAGNOSIS — Z510 Encounter for antineoplastic radiation therapy: Secondary | ICD-10-CM | POA: Diagnosis not present

## 2016-01-01 ENCOUNTER — Encounter: Payer: Self-pay | Admitting: *Deleted

## 2016-01-01 ENCOUNTER — Ambulatory Visit
Admission: RE | Admit: 2016-01-01 | Discharge: 2016-01-01 | Disposition: A | Discharge status: Home / Self Care | Payer: BLUE CROSS/BLUE SHIELD | Source: Ambulatory Visit | Attending: Radiation Oncology | Admitting: Radiation Oncology

## 2016-01-01 DIAGNOSIS — Z510 Encounter for antineoplastic radiation therapy: Secondary | ICD-10-CM | POA: Diagnosis not present

## 2016-01-01 NOTE — Progress Notes (Signed)
Council Psychosocial Distress Screening Clinical Social Work  Clinical Social Work was referred by distress screening protocol.  The patient scored a 5 on the Psychosocial Distress Thermometer which indicates moderate distress. Clinical Social Worker phoned pt to assess for distress and other psychosocial needs. Pt reports he has met with dietician and that his biggest issue is nutrition. He reports his wife is a good support and she can follow up with dietician. Pt will reach out if needs arise, but he declines concerns currently.   ONCBCN DISTRESS SCREENING 11/27/2015  Distress experienced in past week (1-10) 5  Emotional problem type Nervousness/Anxiety;Adjusting to illness  Physical Problem type Pain;Nausea/vomiting;Getting around;Bathing/dressing;Loss of appetitie;Changes in urination;Skin dry/itchy  Physician notified of physical symptoms Yes  Referral to clinical social work Yes    Clinical Social Worker follow up needed: No.  If yes, follow up plan: Loren Racer, [place]  [facility] Phone: [phone] Fax: [phone]

## 2016-01-02 ENCOUNTER — Ambulatory Visit
Admission: RE | Admit: 2016-01-02 | Discharge: 2016-01-02 | Disposition: A | Discharge status: Home / Self Care | Payer: BLUE CROSS/BLUE SHIELD | Source: Ambulatory Visit | Attending: Radiation Oncology | Admitting: Radiation Oncology

## 2016-01-02 VITALS — BP 98/65 | HR 90 | Temp 97.5°F | Ht 71.5 in | Wt 148.1 lb

## 2016-01-02 DIAGNOSIS — Z510 Encounter for antineoplastic radiation therapy: Secondary | ICD-10-CM | POA: Diagnosis not present

## 2016-01-02 DIAGNOSIS — C250 Malignant neoplasm of head of pancreas: Secondary | ICD-10-CM

## 2016-01-02 DIAGNOSIS — C259 Malignant neoplasm of pancreas, unspecified: Secondary | ICD-10-CM

## 2016-01-02 MED ORDER — MORPHINE SULFATE ER 30 MG PO TBCR: 30.0000 mg | EXTENDED_RELEASE_TABLET | Freq: Two times a day (BID) | ORAL | Status: DC

## 2016-01-02 MED ORDER — PROMETHAZINE HCL 12.5 MG PO TABS: 12.5000 mg | ORAL_TABLET | Freq: Four times a day (QID) | ORAL | Status: DC | PRN

## 2016-01-02 NOTE — Progress Notes (Addendum)
Department of Radiation Oncology  Phone:  [phone] Fax:        [phone]  Weekly Treatment Note    Name: Antonio Hancock Date: 01/02/2016 MRN: [id number] DOB: 1941-11-06   Diagnosis:     ICD-9-CM ICD-10-CM   1. Malignant neoplasm of pancreas, unspecified location of malignancy ([place]) 157.9 C25.9 morphine (MS CONTIN) 30 MG 12 hr tablet     promethazine (PHENERGAN) 12.5 MG tablet  2. Malignant neoplasm of head of pancreas ([facility]) 157.0 C25.0      Current dose: 30.6 out of 45 Gy  Current fraction:17 out of 28   MEDICATIONS: Current Outpatient Prescriptions  Medication Sig Dispense Refill  . ALPRAZolam (XANAX) 0.25 MG tablet Take 0.25 mg by mouth at bedtime.     Marland Kitchen apixaban (ELIQUIS) 5 MG TABS tablet Take 1 tablet (5 mg total) by mouth 2 (two) times daily. 60 tablet 5  . atorvastatin (LIPITOR) 10 MG tablet Take 10 mg by mouth daily.    . capecitabine (XELODA) 500 MG tablet Take one tablet twice a day Monday to Friday with radiation. 50 tablet 0  . metFORMIN (GLUCOPHAGE-XR) 500 MG 24 hr tablet Take 500 mg by mouth daily.     Marland Kitchen morphine (MS CONTIN) 30 MG 12 hr tablet Take 1 tablet (30 mg total) by mouth every 12 (twelve) hours. 30 tablet 0  . Multiple Vitamins-Minerals (MULTIVITAMINS THER. W/MINERALS) TABS Take 1 tablet by mouth daily. Reported on 12/26/2015    . ondansetron (ZOFRAN) 8 MG tablet Take 1 tablet (8 mg total) by mouth every 8 (eight) hours as needed for nausea or vomiting. 30 tablet 1  . oxyCODONE (OXY IR/ROXICODONE) 5 MG immediate release tablet Take 1 tablet (5 mg total) by mouth every 4 (four) hours as needed for moderate pain. 60 tablet 0  . pantoprazole (PROTONIX) 40 MG tablet Take 40 mg by mouth daily.    . promethazine (PHENERGAN) 12.5 MG tablet Take 1 tablet (12.5 mg total) by mouth every 6 (six) hours as needed for nausea or vomiting. 30 tablet 3  . megestrol (MEGACE) 400 MG/10ML suspension Take 10 mLs (400 mg total) by mouth 2 (two) times daily. (Patient  not taking: Reported on 01/02/2016) 240 mL 0  . olmesartan (BENICAR) 40 MG tablet Take 40 mg by mouth daily. Reported on 01/02/2016     No current facility-administered medications for this encounter.     ALLERGIES: Review of patient's allergies indicates no known allergies.   LABORATORY DATA:  Lab Results  Component Value Date   WBC 1.8* 12/30/2015   HGB 10.4* 12/30/2015   HCT 29.7* 12/30/2015   MCV 86.8 12/30/2015   PLT 156 12/30/2015   Lab Results  Component Value Date   NA 127* 12/30/2015   K 4.4 12/30/2015   CL 97* 10/24/2015   CO2 19* 12/30/2015   Lab Results  Component Value Date   ALT 17 12/30/2015   AST 28 12/30/2015   ALKPHOS 161* 12/30/2015   BILITOT 1.71* 12/30/2015     NARRATIVE: Antonio Hancock was seen today for weekly treatment management. The chart was checked and the patient's films were reviewed.  On review of systems the patient reports that he is not doing as poorly as was originally described by other staff. He believes his pain is in control with morphine 30 mg continuous release. He is also using 1 tablet of oxycodone at nighttime. He requests a refill of this as well as Phenergan. He feels as though his nausea is  not is well controlled as he would like and has had 2 episodes of emesis this week. He describes a regimen of taking to Phenergan and a time twice a day, and Zofran 8 mg once a day. He isn't taking Imodium up to 4 tablets per day and states that he has 2 episodes of uncontrolled diarrhea per day. Is also been dizzy when standing and needs to contact Dr. Alen Blew about whether or not he needs additional Xeloda. He is starting to feel fatigued from radiation treatments. No other complaints or verbalized.  PHYSICAL EXAMINATION: height is 5' 11.5" (1.816 m) and weight is 148 lb 1.6 oz (67.178 kg). His oral temperature is 97.5 F (36.4 C). His blood pressure is 98/65 and his pulse is 90. His oxygen saturation is 100%.      Pain scale 0/10 General this  is a chronically ill-appearing Caucasian male in no acute distress. He has icteric changes of the sclera and scan though this is essentially unchanged since previous visits. No evidence of cardiopulmonary distress is noted and he has normal effort.   ASSESSMENT: Stage IIB pancreatic cancer, struggling with symptom management.  PLAN: We will continue with the patient's radiation treatment as planned. We have discussed the rationale for making some alterations in his prn medications. redivides a plan of using his Zofran and a more scheduled fashion, and I refilled his Phenergan and morphine. He will also begin using Imodium 2 tablets at onset of diarrhea followed by 1 tablet every loose bowel movement with a maximum 8 tablets per day. He will also continue to push oral hydration. However if he is not doing better with managing his oral intake early next week, we would consider further workup of this.    Jodelle Gross, MD

## 2016-01-02 NOTE — Progress Notes (Signed)
Antonio Hancock has completed 17 fractions to his pancreas.  He reports his pain is controlled with Morphine 30 mg which he takes in the mornings and oxycodone which he takes at night.  He is requesting a refill of morphine.  He reports having nausea and is taking zofran and phenergan.  He needs a refill on phenergan.  He reports throwing up 2 times this week.  He also reports having diarrhea about 2 times per day.  He is taking Imodium.  He has last 5 lbs since last week.  Orthostatic vitals taken: bp sitting 124/75, hr 84, bp standing 98/65, hr 90.  He reports feeling dizzy when standing.  He is taking xeloda.  He reports feeling fatigue.  BP 124/75 mmHg  Pulse 84  Temp(Src) 97.5 F (36.4 C) (Oral)  Ht 5' 11.5" (1.816 m)  Wt 148 lb 1.6 oz (67.178 kg)  BMI 20.37 kg/m2  SpO2 100%   Wt Readings from Last 3 Encounters:  01/02/16 148 lb 1.6 oz (67.178 kg)  12/30/15 150 lb 14.4 oz (68.448 kg)  12/26/15 153 lb 9.6 oz (69.673 kg)

## 2016-01-02 NOTE — Patient Instructions (Signed)
Contact our office if you have any questions following today's appointment: [phone].  

## 2016-01-04 IMAGING — RF DG ERCP WO/W SPHINCTEROTOMY
1 series · 13 of 13 positions shown · non-contrast
Comparison: CT abdomen pelvis - 10/23/2015

CLINICAL DATA: ERCP and stent placement for CBD stricture.

EXAM:
ERCP
TECHNIQUE: Multiple spot images obtained with the fluoroscopic device and
submitted for interpretation post-procedure.

[Series 1: run · 13 of 13 slices shown]
[im 1/13]
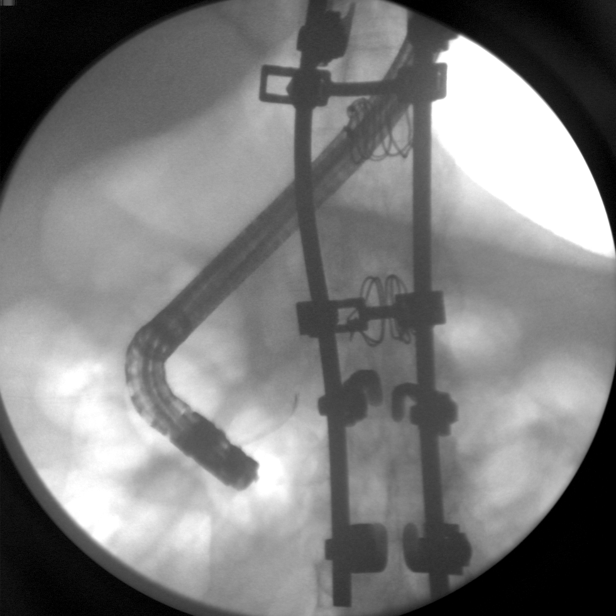
[im 2/13]
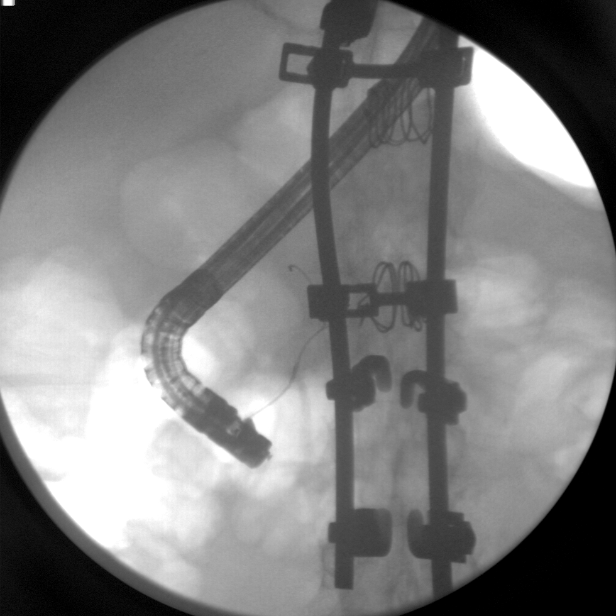
[im 3/13]
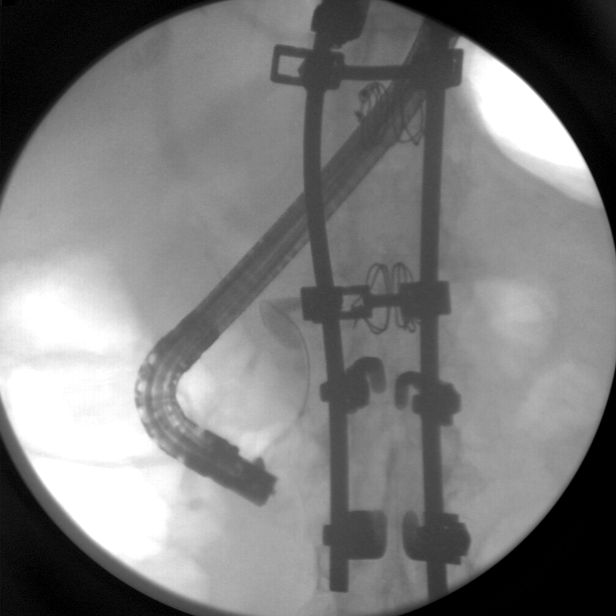
[im 4/13]
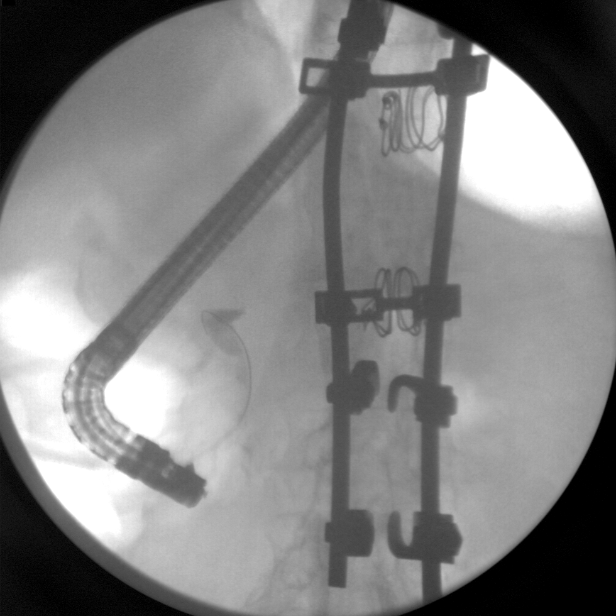
[im 5/13]
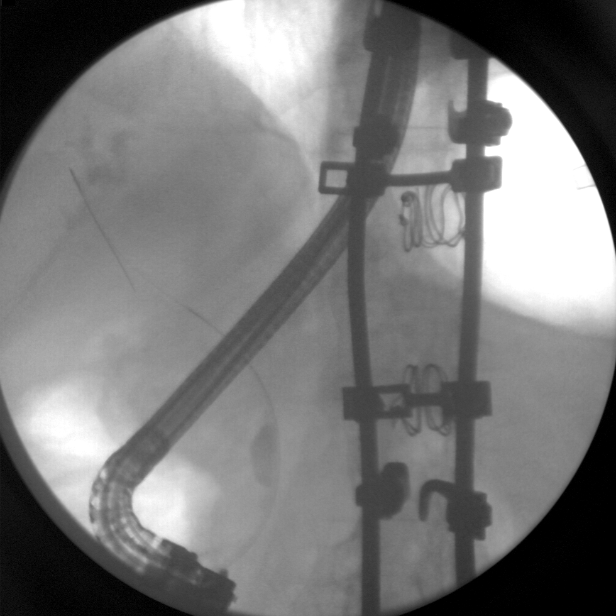
[im 6/13]
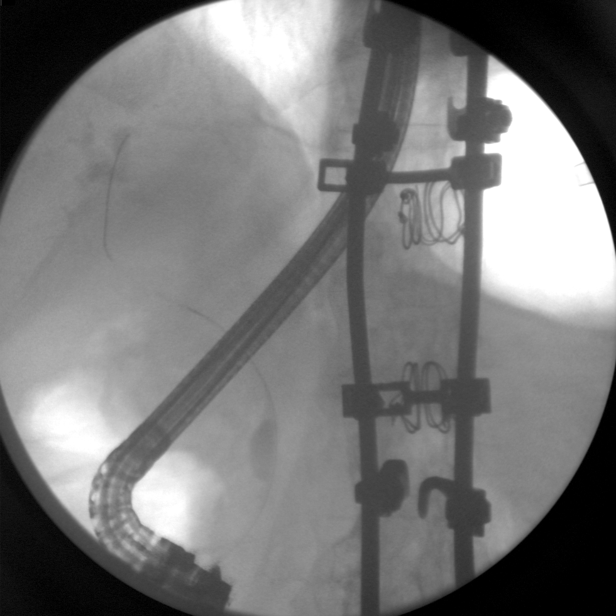
[im 7/13]
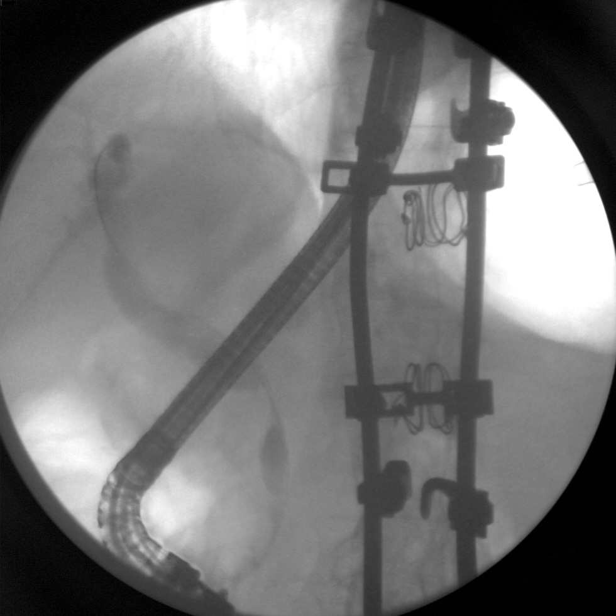
[im 8/13]
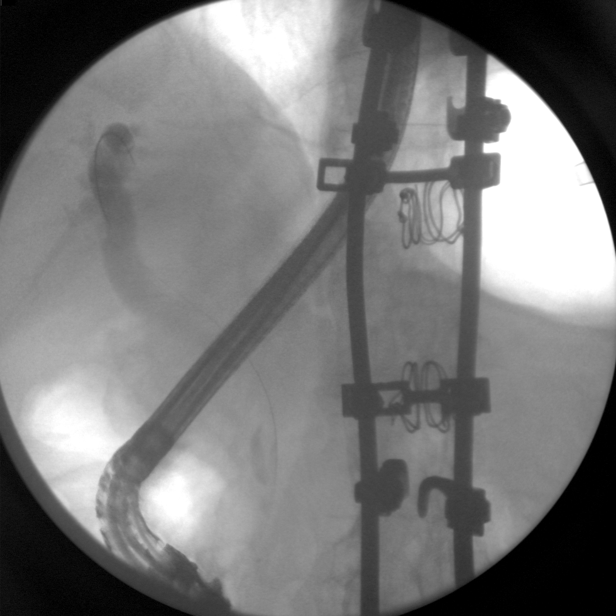
[im 9/13]
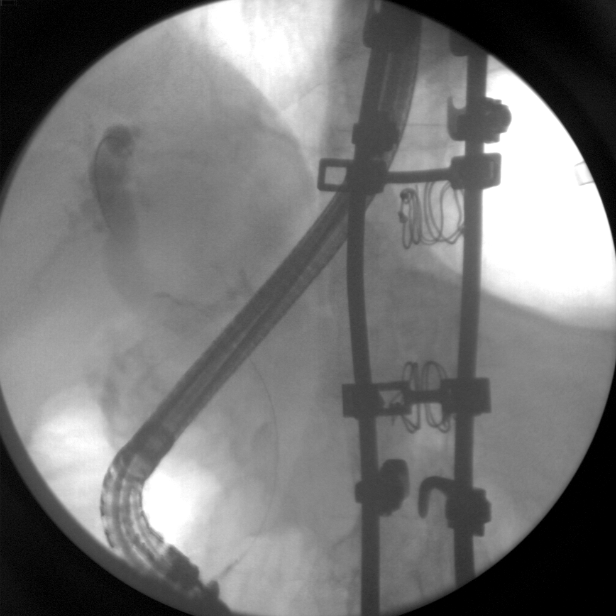
[im 10/13]
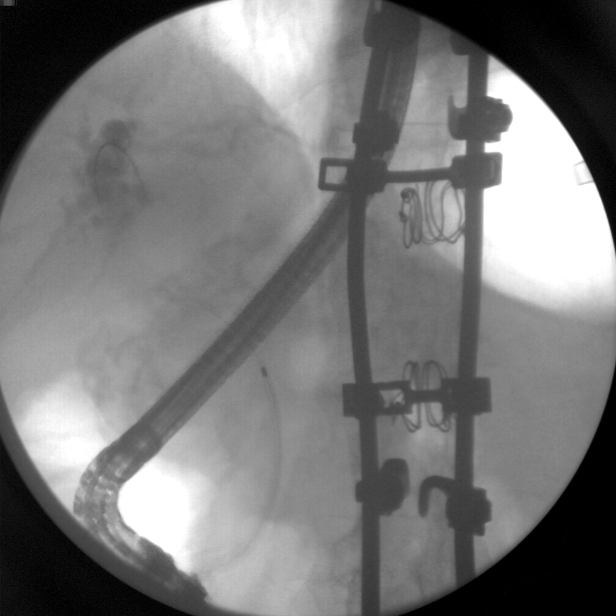
[im 11/13]
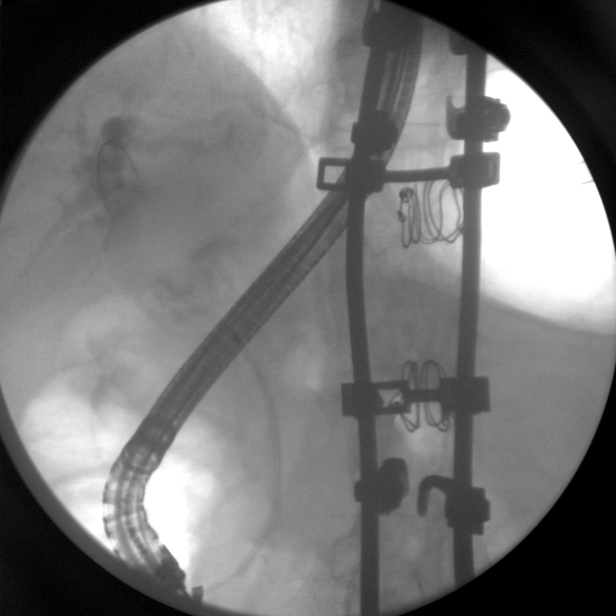
[im 12/13]
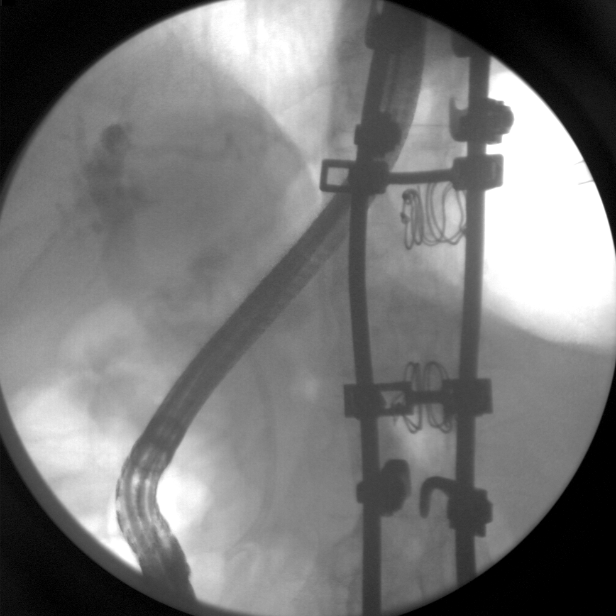
[im 13/13]
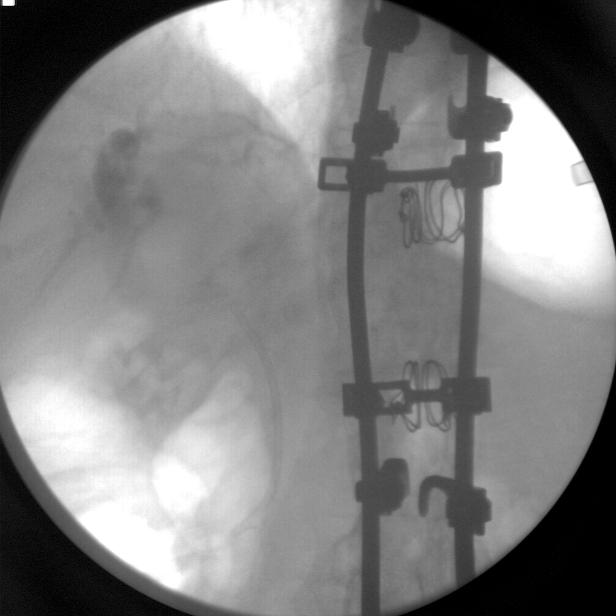

[13 of 13 positions shown; findings below may reference images not displayed]

FINDINGS: Thirteen spot intraoperative radiographic images of the right upper
abdominal quadrant during ERCP are provided for review.

Initial image demonstrates an ERCP probe overlying the right upper
abdominal quadrant.

Subsequent images demonstrate selective cannulation opacification
the distal aspect of the CBD. There are apparent tandem areas of
presumed malignant narrowing involving the mid and distal aspects of
the CBD. These findings are associated with moderate dilatation of
the more central aspect of the CBD. There is minimal opacification
of the central aspect of the intrahepatic biliary tree which appears
mildly dilated. There is no definitive opacification of either the
cystic or pancreatic duct.

Subsequent images demonstrate placement of a plastic internal
biliary stent overlying expected location of the distal aspect of
the CBD.
IMPRESSION: ERCP with plastic internal biliary stent placement as above.

These images were submitted for radiologic interpretation only.
Please see the procedural report for the amount of contrast and the
fluoroscopy time utilized.

## 2016-01-05 ENCOUNTER — Ambulatory Visit
Admission: RE | Admit: 2016-01-05 | Discharge: 2016-01-05 | Disposition: A | Discharge status: Home / Self Care | Payer: BLUE CROSS/BLUE SHIELD | Source: Ambulatory Visit | Attending: Radiation Oncology | Admitting: Radiation Oncology

## 2016-01-05 DIAGNOSIS — Z510 Encounter for antineoplastic radiation therapy: Secondary | ICD-10-CM | POA: Diagnosis not present

## 2016-01-06 ENCOUNTER — Ambulatory Visit
Admission: RE | Admit: 2016-01-06 | Discharge: 2016-01-06 | Disposition: A | Discharge status: Home / Self Care | Payer: BLUE CROSS/BLUE SHIELD | Source: Ambulatory Visit | Attending: Radiation Oncology | Admitting: Radiation Oncology

## 2016-01-06 DIAGNOSIS — Z510 Encounter for antineoplastic radiation therapy: Secondary | ICD-10-CM | POA: Diagnosis not present

## 2016-01-07 ENCOUNTER — Telehealth: Payer: Self-pay | Admitting: *Deleted

## 2016-01-07 ENCOUNTER — Ambulatory Visit
Admission: RE | Admit: 2016-01-07 | Discharge: 2016-01-07 | Disposition: A | Discharge status: Home / Self Care | Payer: BLUE CROSS/BLUE SHIELD | Source: Ambulatory Visit | Attending: Radiation Oncology | Admitting: Radiation Oncology

## 2016-01-07 DIAGNOSIS — Z510 Encounter for antineoplastic radiation therapy: Secondary | ICD-10-CM | POA: Diagnosis not present

## 2016-01-07 NOTE — Telephone Encounter (Signed)
Oncology Nurse Navigator Documentation  Oncology Nurse Navigator Flowsheets 01/07/2016  Navigator Location [facility]  Navigator Encounter Type Telephone  Telephone Outgoing Call;Patient Update  Patient Visit Type -  Treatment Phase -  Barriers/Navigation Needs -wife reports he needs refill on Xeloda (will need #6 pills to complete treatment)  Education -Encouraged wife to leave him a small snack every few hours and something to drink by his side at all times. Continue to take Imodium for diarrhea  Interventions Will inquire from MD if he wants NP to see him and possibly receive IV fluids before the weekend.  Referrals -  Coordination of Care -  Support Groups/Services -  Time Spent with Patient 15  Called to follow up on patient:spoke to wife due to his poor hearing. She reports he barely eats, maybe drinks 16 ounces fluid/day. Having 3-4 "diarrhea stools" per day. Did not fill the megace that was ordered by Dr. Olean Ree suggested he not fill this due to his cardiac status. Of note, he has lost approximately 10 lb since early January. Made her aware that she will be called tomorrow.

## 2016-01-08 ENCOUNTER — Ambulatory Visit
Admission: RE | Admit: 2016-01-08 | Discharge: 2016-01-08 | Disposition: A | Discharge status: Home / Self Care | Payer: BLUE CROSS/BLUE SHIELD | Source: Ambulatory Visit | Attending: Radiation Oncology | Admitting: Radiation Oncology

## 2016-01-08 ENCOUNTER — Other Ambulatory Visit (HOSPITAL_BASED_OUTPATIENT_CLINIC_OR_DEPARTMENT_OTHER): Payer: BLUE CROSS/BLUE SHIELD

## 2016-01-08 ENCOUNTER — Telehealth: Payer: Self-pay | Admitting: *Deleted

## 2016-01-08 ENCOUNTER — Ambulatory Visit (HOSPITAL_BASED_OUTPATIENT_CLINIC_OR_DEPARTMENT_OTHER): Payer: BLUE CROSS/BLUE SHIELD | Admitting: Nurse Practitioner

## 2016-01-08 ENCOUNTER — Telehealth: Payer: Self-pay | Admitting: Nurse Practitioner

## 2016-01-08 ENCOUNTER — Other Ambulatory Visit: Payer: Self-pay

## 2016-01-08 ENCOUNTER — Ambulatory Visit (HOSPITAL_BASED_OUTPATIENT_CLINIC_OR_DEPARTMENT_OTHER): Payer: BLUE CROSS/BLUE SHIELD

## 2016-01-08 VITALS — BP 105/60 | HR 86 | Temp 97.7°F | Resp 18 | Ht 71.5 in | Wt 145.1 lb

## 2016-01-08 DIAGNOSIS — E860 Dehydration: Secondary | ICD-10-CM | POA: Insufficient documentation

## 2016-01-08 DIAGNOSIS — C250 Malignant neoplasm of head of pancreas: Secondary | ICD-10-CM

## 2016-01-08 DIAGNOSIS — R197 Diarrhea, unspecified: Secondary | ICD-10-CM

## 2016-01-08 DIAGNOSIS — E46 Unspecified protein-calorie malnutrition: Secondary | ICD-10-CM

## 2016-01-08 DIAGNOSIS — Z510 Encounter for antineoplastic radiation therapy: Secondary | ICD-10-CM | POA: Diagnosis not present

## 2016-01-08 DIAGNOSIS — E8809 Other disorders of plasma-protein metabolism, not elsewhere classified: Secondary | ICD-10-CM

## 2016-01-08 LAB — COMPREHENSIVE METABOLIC PANEL
ALT: 16 U/L (ref 0–55)
AST: 28 U/L (ref 5–34)
Albumin: 2.8 g/dL — ABNORMAL LOW (ref 3.5–5.0)
Alkaline Phosphatase: 141 U/L (ref 40–150)
Anion Gap: 10 mEq/L (ref 3–11)
BUN: 20.1 mg/dL (ref 7.0–26.0)
CALCIUM: 8.6 mg/dL (ref 8.4–10.4)
CHLORIDE: 99 meq/L (ref 98–109)
CO2: 17 mEq/L — ABNORMAL LOW (ref 22–29)
Creatinine: 1.7 mg/dL — ABNORMAL HIGH (ref 0.7–1.3)
EGFR: 40 mL/min/{1.73_m2} — AB (ref >90)
Glucose: 90 mg/dl (ref 70–140)
POTASSIUM: 4.5 meq/L (ref 3.5–5.1)
Sodium: 127 mEq/L — ABNORMAL LOW (ref 136–145)
Total Bilirubin: 1.51 mg/dL — ABNORMAL HIGH (ref 0.20–1.20)
Total Protein: 6.8 g/dL (ref 6.4–8.3)

## 2016-01-08 LAB — CBC WITH DIFFERENTIAL/PLATELET
BASO%: 0.2 % (ref 0.0–2.0)
Basophils Absolute: 0 10*3/uL (ref 0.0–0.1)
EOS%: 2.9 % (ref 0.0–7.0)
Eosinophils Absolute: 0.1 10*3/uL (ref 0.0–0.5)
HEMATOCRIT: 32.8 % — AB (ref 38.4–49.9)
HEMOGLOBIN: 11.1 g/dL — AB (ref 13.0–17.1)
LYMPH#: 0.1 10*3/uL — AB (ref 0.9–3.3)
LYMPH%: 2.3 % — ABNORMAL LOW (ref 14.0–49.0)
MCH: 30.8 pg (ref 27.2–33.4)
MCHC: 33.8 g/dL (ref 32.0–36.0)
MCV: 91.2 fL (ref 79.3–98.0)
MONO#: 0.6 10*3/uL (ref 0.1–0.9)
MONO%: 21.4 % — ABNORMAL HIGH (ref 0.0–14.0)
NEUT%: 73.2 % (ref 39.0–75.0)
NEUTROS ABS: 1.9 10*3/uL (ref 1.5–6.5)
Platelets: 187 10*3/uL (ref 140–400)
RBC: 3.59 10*6/uL — ABNORMAL LOW (ref 4.20–5.82)
RDW: 16.7 % — AB (ref 11.0–14.6)
WBC: 2.6 10*3/uL — AB (ref 4.0–10.3)

## 2016-01-08 MED ORDER — SODIUM CHLORIDE 0.9 % IV SOLN
1500.0000 mL | Freq: Once | INTRAVENOUS | Status: AC
  Administered 2016-01-08: 1500 mL via INTRAVENOUS

## 2016-01-08 MED ORDER — DIPHENOXYLATE-ATROPINE 2.5-0.025 MG PO TABS: 2.0000 | ORAL_TABLET | Freq: Four times a day (QID) | ORAL | Status: AC | PRN

## 2016-01-08 MED ORDER — DIPHENOXYLATE-ATROPINE 2.5-0.025 MG PO TABS: 2.0000 | ORAL_TABLET | Freq: Four times a day (QID) | ORAL | Status: DC | PRN

## 2016-01-08 NOTE — Telephone Encounter (Signed)
POF sent to schedule IV Fluids

## 2016-01-08 NOTE — Telephone Encounter (Signed)
  Oncology Nurse Navigator Documentation  Navigator Location: [facility] (01/08/16 0821) Navigator Encounter Type: Telephone (01/08/16 [id number]) Telephone: Outgoing Call;Symptom Mgt (01/08/16 [id number])  Called wife back and she agrees to bring [name] to be seen today at 11:00/11:30 by [facility].

## 2016-01-08 NOTE — Patient Instructions (Signed)

## 2016-01-08 NOTE — Telephone Encounter (Signed)
per pfo to sch pt appt-per pof pt aware

## 2016-01-09 ENCOUNTER — Ambulatory Visit
Admission: RE | Admit: 2016-01-09 | Discharge: 2016-01-09 | Disposition: A | Discharge status: Home / Self Care | Payer: BLUE CROSS/BLUE SHIELD | Source: Ambulatory Visit | Attending: Radiation Oncology | Admitting: Radiation Oncology

## 2016-01-09 ENCOUNTER — Other Ambulatory Visit: Payer: Self-pay | Admitting: *Deleted

## 2016-01-09 ENCOUNTER — Encounter: Payer: Self-pay | Admitting: Radiation Oncology

## 2016-01-09 ENCOUNTER — Ambulatory Visit: Payer: BLUE CROSS/BLUE SHIELD

## 2016-01-09 VITALS — BP 87/70 | HR 88 | Temp 97.8°F | Resp 22 | Wt 147.3 lb

## 2016-01-09 DIAGNOSIS — Z510 Encounter for antineoplastic radiation therapy: Secondary | ICD-10-CM | POA: Diagnosis not present

## 2016-01-09 DIAGNOSIS — C250 Malignant neoplasm of head of pancreas: Secondary | ICD-10-CM

## 2016-01-09 DIAGNOSIS — E860 Dehydration: Secondary | ICD-10-CM

## 2016-01-09 NOTE — Progress Notes (Signed)
Department of Radiation Oncology  Phone:  [phone] Fax:        [phone]  Weekly Treatment Note    Name: Antonio Hancock Date: 01/09/2016 MRN: [id number] DOB: 1941/08/24   Diagnosis:     ICD-9-CM ICD-10-CM   1. 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 ALPRAZolam (XANAX) 0.25 MG tablet Take 0.25 mg by mouth at bedtime.     Marland Kitchen apixaban (ELIQUIS) 5 MG TABS tablet Take 1 tablet (5 mg total) by mouth 2 (two) times daily. 60 tablet 5  . atorvastatin (LIPITOR) 10 MG tablet Take 10 mg by mouth daily.    . capecitabine (XELODA) 500 MG tablet Take one tablet twice a day Monday to Friday with radiation. 50 tablet 0  . diphenoxylate-atropine (LOMOTIL) 2.5-0.025 MG tablet Take 2 tablets by mouth 4 (four) times daily as needed for diarrhea or loose stools. 30 tablet 0  . metFORMIN (GLUCOPHAGE-XR) 500 MG 24 hr tablet Take 500 mg by mouth daily.     Marland Kitchen morphine (MS CONTIN) 30 MG 12 hr tablet Take 1 tablet (30 mg total) by mouth every 12 (twelve) hours. 30 tablet 0  . Multiple Vitamins-Minerals (MULTIVITAMINS THER. W/MINERALS) TABS Take 1 tablet by mouth daily. Reported on 12/26/2015    . olmesartan (BENICAR) 40 MG tablet Take 40 mg by mouth daily. Reported on 01/02/2016    . ondansetron (ZOFRAN) 8 MG tablet Take 1 tablet (8 mg total) by mouth every 8 (eight) hours as needed for nausea or vomiting. 30 tablet 1  . oxyCODONE (OXY IR/ROXICODONE) 5 MG immediate release tablet Take 1 tablet (5 mg total) by mouth every 4 (four) hours as needed for moderate pain. 60 tablet 0  . pantoprazole (PROTONIX) 40 MG tablet Take 40 mg by mouth daily.    . promethazine (PHENERGAN) 12.5 MG tablet Take 1 tablet (12.5 mg total) by mouth every 6 (six) hours as needed for nausea or vomiting. 30 tablet 3  . megestrol (MEGACE) 400 MG/10ML suspension Take 10 mLs (400 mg  total) by mouth 2 (two) times daily. (Patient not taking: Reported on 01/08/2016) 240 mL 0   No current facility-administered medications for this encounter.     ALLERGIES: Review of patient's allergies indicates no known allergies.   LABORATORY DATA:  Lab Results  Component Value Date   WBC 2.6* 01/08/2016   HGB 11.1* 01/08/2016   HCT 32.8* 01/08/2016   MCV 91.2 01/08/2016   PLT 187 01/08/2016   Lab Results  Component Value Date   NA 127* 01/08/2016   K 4.5 01/08/2016   CL 97* 10/24/2015   CO2 17* 01/08/2016   Lab Results  Component Value Date   ALT 16 01/08/2016   AST 28 01/08/2016   ALKPHOS 141 01/08/2016   BILITOT 1.51* 01/08/2016     NARRATIVE: Antonio Hancock was seen today for weekly treatment management. The chart was checked and the patient's films were reviewed.  Weekly rad txs abdomen,  Taking lomotil  Now, has had 2 episodes diarrhea this morning so far, very fatigued,weak, took orthostatic vitals again, nausea in am when he first gets up,  Ate some applesauce this am 2 glasses water , so far today, anorexia,  He has IVF"S schedueld for Monday 01/12/16,  BP 87/70 mmHg  Pulse 88  Temp(Src) 97.8 F (36.6 C) (Oral)  Resp  22  Wt 147 lb 4.8 oz (66.815 kg)  SpO2 100% sitting vitals BP 87/70 mmHg  Pulse 88  Temp(Src) 97.8 F (36.6 C) (Oral)  Resp 22  Wt 147 lb 4.8 oz (66.815 kg)  SpO2 100% standing vitals  PHYSICAL EXAMINATION: weight is 147 lb 4.8 oz (66.815 kg). His oral temperature is 97.8 F (36.6 C). His blood pressure is 87/70 and his pulse is 88. His respiration is 22 and oxygen saturation is 100%.        ASSESSMENT: The patient is doing satisfactorily with treatment.  PLAN: We will continue with the patient's radiation treatment as planned. The patient is going to get some IV fluids next Monday. He feels that he will be able to increase his fluid intake this weekend. He also has begun Lomotil after getting a prescription filled yesterday for this which  should also help his fluid status.

## 2016-01-09 NOTE — Progress Notes (Signed)
Weekly rad txs abdomen,  Taking lomotil  Now, has had 2 episodes diarrhea this morning so far, very fatigued,weak, took orthostatic vitals again, nausea in am when he first gets up,  Ate some applesauce this am 2 glasses water , so far today, anorexia,  He has IVF"S schedueld for Monday 01/12/16,  BP 97/55 mmHg  Pulse 81  Temp(Src) 97.8 F (36.6 C) (Oral)  Resp 20  Wt 147 lb 4.8 oz (66.815 kg)  SpO2 100% sitting vitals BP 87/70 mmHg  Pulse 88  Temp(Src) 97.8 F (36.6 C) (Oral)  Resp 22  Wt 147 lb 4.8 oz (66.815 kg)  SpO2 100% standing vitals

## 2016-01-10 ENCOUNTER — Ambulatory Visit: Payer: BLUE CROSS/BLUE SHIELD

## 2016-01-11 ENCOUNTER — Encounter: Payer: Self-pay | Admitting: Nurse Practitioner

## 2016-01-11 ENCOUNTER — Other Ambulatory Visit: Payer: Self-pay | Admitting: Nurse Practitioner

## 2016-01-11 DIAGNOSIS — R197 Diarrhea, unspecified: Secondary | ICD-10-CM | POA: Insufficient documentation

## 2016-01-11 DIAGNOSIS — E860 Dehydration: Secondary | ICD-10-CM

## 2016-01-11 DIAGNOSIS — E46 Unspecified protein-calorie malnutrition: Secondary | ICD-10-CM | POA: Insufficient documentation

## 2016-01-11 NOTE — Assessment & Plan Note (Signed)
Patient continues with concurrent Xeloda chemotherapy and radiation treatments.  Patient states he plans to receive #20 out of 28 total radiation treatments today.  Patient reports increased diarrhea issues and subsequent dehydration.  He will receive IV fluid rehydration today.  He would like to return on Monday, 01/12/2016 for additional IV fluid rehydration.  He is scheduled to return for labs and a follow-up visit on 01/15/2016.

## 2016-01-11 NOTE — Progress Notes (Signed)
SYMPTOM MANAGEMENT CLINIC   HPI: Antonio Hancock 75 y.o. male diagnosed with pancreatic cancer.  Currently undergoing Xeloda and radiation treatments.  Patient reports to the [facility] today with complaint of increased diarrhea and subsequent dehydration.  He denies any nausea or vomiting.  He denies any recent fevers or chills.   HPI  Review of Systems  Constitutional: Positive for weight loss and malaise/fatigue.  Gastrointestinal: Positive for diarrhea. Negative for nausea, vomiting and abdominal pain.  Neurological: Positive for weakness.  All other systems reviewed and are negative.   Past Medical History  Diagnosis Date  . Hypertension   . Diabetes mellitus     Diagnosed Fall 2012  . Mobitz type 2 second degree AV block     11/11/2011  . Tobacco abuse     1/2 ppd x 55 yrs  . Pacemaker 2012    Medtronic Adapta L pulse generator, serial number [id number]  . 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 Hyperlipidemia   . HOH (hard of hearing)   . Anxiety     Past Surgical History  Procedure Laterality Date  . Back surgery    . Permanent pacemaker insertion N/A 11/12/2011    Procedure: PERMANENT PACEMAKER INSERTION;  Surgeon: Deboraha Sprang, MD;  Location[facility] CATH LAB;  Service: Cardiovascular;  Laterality: Left; Medtronic Adapta L pulse generator, serial number [id number]    . Hernia repair  1996    left inguinal hernia repair   . Ercp N/A 10/24/2015    Procedure: ENDOSCOPIC RETROGRADE CHOLANGIOPANCREATOGRAPHY (ERCP);  Surgeon: Carol Ada, MD;  Location: [facility] ENDOSCOPY;  Service: Endoscopy;  Laterality: N/A;  . Biliary stent placement N/A 10/24/2015    Procedure: BILIARY STENT PLACEMENT;  Surgeon: Carol Ada, MD;  Location: [facility] ENDOSCOPY;  Service: Endoscopy;  Laterality: N/A;  . Eus N/A 11/07/2015    Procedure: FULL UPPER ENDOSCOPIC ULTRASOUND (EUS) RADIAL;  Surgeon: Carol Ada, MD;  Location: [facility] ENDOSCOPY;  Service:  Endoscopy;  Laterality: N/A;  . Fine needle aspiration N/A 11/07/2015    Procedure: FINE NEEDLE ASPIRATION (FNA) LINEAR;  Surgeon: Carol Ada, MD;  Location: [facility] ENDOSCOPY;  Service: Endoscopy;  Laterality: N/A;  . Ercp N/A 11/07/2015    Procedure: ENDOSCOPIC RETROGRADE CHOLANGIOPANCREATOGRAPHY (ERCP);  Surgeon: Carol Ada, MD;  Location: [facility] ENDOS[facility];  Service: Endoscopy;  Laterality: N/A;    has Hypertension; Diabetes mellitus; Atrioventricular block, complete ([place]); Tobacco abuse; Pacemaker-Medtronic; Atrial fibrillation ([place]); Obstructive sleep apnea; Chronotropic incompetence with sinus node dysfunction ([place]); Renal mass, right; Renal mass; Jaundice; Hyponatremia; Transaminitis; Pancreatic mass; Malignant neoplasm of head of pancreas ([place]); Dehydration; Diarrhea; Hyperbilirubinemia; and Hypoalbuminemia due to protein-calorie malnutrition ([place]) on his problem list.    has No Known Allergies.    Medication List       This list is accurate as of: 01/08/16 11:59 PM.  Always use your most recent med list.               ALPRAZolam 0.25 MG tablet  Commonly known as:  XANAX  Take 0.25 mg by mouth at bedtime.     apixaban 5 MG Tabs tablet  Commonly known as:  ELIQUIS  Take 1 tablet (5 mg total) by mouth 2 (two) times daily.     atorvastatin 10 MG tablet  Commonly known as:  LIPITOR  Take 10 mg by mouth daily.     capecitabine 500 MG tablet  Commonly known as:  XELODA  Take one tablet  twice a day Monday to Friday with radiation.     diphenoxylate-atropine 2.5-0.025 MG tablet  Commonly known as:  LOMOTIL  Take 2 tablets by mouth 4 (four) times daily as needed for diarrhea or loose stools.     megestrol 400 MG/10ML suspension  Commonly known as:  MEGACE  Take 10 mLs (400 mg total) by mouth 2 (two) times daily.     metFORMIN 500 MG 24 hr tablet  Commonly known as:  GLUCOPHAGE-XR  Take 500 mg by mouth daily.     morphine 30 MG 12 hr tablet  Commonly known as:  MS CONTIN  Take  1 tablet (30 mg total) by mouth every 12 (twelve) hours.     multivitamins ther. w/minerals Tabs tablet  Take 1 tablet by mouth daily. Reported on 12/26/2015     olmesartan 40 MG tablet  Commonly known as:  BENICAR  Take 40 mg by mouth daily. Reported on 01/02/2016     ondansetron 8 MG tablet  Commonly known as:  ZOFRAN  Take 1 tablet (8 mg total) by mouth every 8 (eight) hours as needed for nausea or vomiting.     oxyCODONE 5 MG immediate release tablet  Commonly known as:  Oxy IR/ROXICODONE  Take 1 tablet (5 mg total) by mouth every 4 (four) hours as needed for moderate pain.     pantoprazole 40 MG tablet  Commonly known as:  PROTONIX  Take 40 mg by mouth daily.     promethazine 12.5 MG tablet  Commonly known as:  PHENERGAN  Take 1 tablet (12.5 mg total) by mouth every 6 (six) hours as needed for nausea or vomiting.         PHYSICAL EXAMINATION  Oncology Vitals 01/09/2016 01/09/2016  Height - -  Weight - 66.815 kg  Weight (lbs) - 147 lbs 5 oz  BMI (kg/m2) - -  Temp - 97.8  Pulse 88 81  Resp 22 20  SpO2 - 100  BSA (m2) - -   BP Readings from Last 2 Encounters:  01/09/16 87/70  01/08/16 105/60    Physical Exam  Constitutional: He is oriented to person, place, and time. Vital signs are normal. He appears malnourished and dehydrated. He appears unhealthy. He appears cachectic.  Patient appears fatigued, weak, pale, and chronically ill.  HENT:  Head: Normocephalic and atraumatic.  Mouth/Throat: Oropharynx is clear and moist.  Partially edentulous.  Eyes: Conjunctivae and EOM are normal. Pupils are equal, round, and reactive to light. Right eye exhibits no discharge. Left eye exhibits no discharge. No scleral icterus.  Neck: Normal range of motion.  Pulmonary/Chest: Effort normal. No respiratory distress.  Musculoskeletal: Normal range of motion. He exhibits no edema or tenderness.  Neurological: He is alert and oriented to person, place, and time.  Skin: Skin is dry.  There is pallor.  Psychiatric: Affect normal.    LABORATORY DATA:. Appointment on 01/08/2016  Component Date Value Ref Range Status  . WBC 01/08/2016 2.6* 4.0 - 10.3 10e3/uL Final  . NEUT# 01/08/2016 1.9  1.5 - 6.5 10e3/uL Final  . HGB 01/08/2016 11.1* 13.0 - 17.1 g/dL Final  . HCT 01/08/2016 32.8* 38.4 - 49.9 % Final  . Platelets 01/08/2016 187  140 - 400 10e3/uL Final  . MCV 01/08/2016 91.2  79.3 - 98.0 fL Final  . MCH 01/08/2016 30.8  27.2 - 33.4 pg Final  . MCHC 01/08/2016 33.8  32.0 - 36.0 g/dL Final  . RBC 01/08/2016 3.59* 4.20 - 5.82 10e6/uL  Final  . RDW 01/08/2016 16.7* 11.0 - 14.6 % Final  . lymph# 01/08/2016 0.1* 0.9 - 3.3 10e3/uL Final  . MONO# 01/08/2016 0.6  0.1 - 0.9 10e3/uL Final  . Eosinophils Absolute 01/08/2016 0.1  0.0 - 0.5 10e3/uL Final  . Basophils Absolute 01/08/2016 0.0  0.0 - 0.1 10e3/uL Final  . NEUT% 01/08/2016 73.2  39.0 - 75.0 % Final  . LYMPH% 01/08/2016 2.3* 14.0 - 49.0 % Final  . MONO% 01/08/2016 21.4* 0.0 - 14.0 % Final  . EOS% 01/08/2016 2.9  0.0 - 7.0 % Final  . BASO% 01/08/2016 0.2  0.0 - 2.0 % Final  . Sodium 01/08/2016 127* 136 - 145 mEq/L Final  . Potassium 01/08/2016 4.5  3.5 - 5.1 mEq/L Final  . Chloride 01/08/2016 99  98 - 109 mEq/L Final  . CO2 01/08/2016 17* 22 - 29 mEq/L Final  . Glucose 01/08/2016 90  70 - 140 mg/dl Final   Glucose reference range is for nonfasting patients. Fasting glucose reference range is 70- 100.  Marland Kitchen BUN 01/08/2016 20.1  7.0 - 26.0 mg/dL Final  . Creatinine 01/08/2016 1.7* 0.7 - 1.3 mg/dL Final  . Total Bilirubin 01/08/2016 1.51* 0.20 - 1.20 mg/dL Final  . Alkaline Phosphatase 01/08/2016 141  40 - 150 U/L Final  . AST 01/08/2016 28  5 - 34 U/L Final  . ALT 01/08/2016 16  0 - 55 U/L Final  . Total Protein 01/08/2016 6.8  6.4 - 8.3 g/dL Final  . Albumin 01/08/2016 2.8* 3.5 - 5.0 g/dL Final  . Calcium 01/08/2016 8.6  8.4 - 10.4 mg/dL Final  . Anion Gap 01/08/2016 10  3 - 11 mEq/L Final  . EGFR 01/08/2016 40* >90  ml/min/1.73 m2 Final   eGFR is calculated using the CKD-EPI Creatinine Equation (2009)     RADIOGRAPHIC STUDIES: No results found.  ASSESSMENT/PLAN:    Malignant neoplasm of head of pancreas [facility]) Patient continues with concurrent Xeloda chemotherapy and radiation treatments.  Patient states he plans to receive #20 out of 28 total radiation treatments today.  Patient reports increased diarrhea issues and subsequent dehydration.  He will receive IV fluid rehydration today.  He would like to return on Monday, 01/12/2016 for additional IV fluid rehydration.  He is scheduled to return for labs and a follow-up visit on 01/15/2016.  Hypoalbuminemia due to protein-calorie malnutrition ([place]) Patient's albumen remains low at 2.8.  Patient was encouraged to push protein in his diet is much as possible.  Hyperbilirubinemia Bilirubin has improved slightly from 1.71 down to 1.51.  Will continue to monitor closely.  Diarrhea Patient reports mild increased diarrhea recently.  He has been taking 1 Imodium tablet at a time.  Patient was encouraged to increase the Imodium to 2 tablets; and also was given a prescription for Lomotil to try.  He was encouraged to alternate both the Imodium and Lomotil if possible.  Also, patient will receive IV fluid rehydration today.  Dehydration Patient is experiencing increased diarrhea; and subsequent dehydration.  Patient received approximately 1500 ML's normal saline IV fluid rehydration while at the cancer Center today.  His family member wishes for patient to return tomorrow for additional IV fluid rehydration before the weekend; the patient refuses.  He did agree to return on Monday, 01/12/2016 for additional IV fluid rehydration.  He was also encouraged to push fluids at home.  Patient stated understanding of all instructions; and was in agreement with this plan of care. The patient knows to call the  clinic with any problems, questions or concerns.    Review/collaboration with Dr. Alen Blew regarding all aspects of patient's visit today.   Total time spent with patient was 25 minutes;  with greater than 75 percent of that time spent in face to face counseling regarding patient's symptoms,  and coordination of care and follow up.  Disclaimer:This dictation was prepared with Dragon/digital dictation along with [employer]. Any transcriptional errors that result from this process are unintentional.  Drue Second, NP 01/11/2016

## 2016-01-11 NOTE — Assessment & Plan Note (Signed)
Patient's albumen remains low at 2.8.  Patient was encouraged to push protein in his diet is much as possible.

## 2016-01-11 NOTE — Assessment & Plan Note (Signed)
Bilirubin has improved slightly from 1.71 down to 1.51.  Will continue to monitor closely.

## 2016-01-11 NOTE — Assessment & Plan Note (Signed)
Patient reports mild increased diarrhea recently.  He has been taking 1 Imodium tablet at a time.  Patient was encouraged to increase the Imodium to 2 tablets; and also was given a prescription for Lomotil to try.  He was encouraged to alternate both the Imodium and Lomotil if possible.  Also, patient will receive IV fluid rehydration today.

## 2016-01-11 NOTE — Assessment & Plan Note (Signed)
Patient is experiencing increased diarrhea; and subsequent dehydration.  Patient received approximately 1500 ML's normal saline IV fluid rehydration while at the cancer Center today.  His family member wishes for patient to return tomorrow for additional IV fluid rehydration before the weekend; the patient refuses.  He did agree to return on Monday, 01/12/2016 for additional IV fluid rehydration.  He was also encouraged to push fluids at home.

## 2016-01-12 ENCOUNTER — Other Ambulatory Visit: Payer: Self-pay

## 2016-01-12 ENCOUNTER — Emergency Department (HOSPITAL_COMMUNITY): Payer: BLUE CROSS/BLUE SHIELD

## 2016-01-12 ENCOUNTER — Encounter: Payer: Self-pay | Admitting: Radiation Oncology

## 2016-01-12 ENCOUNTER — Ambulatory Visit (HOSPITAL_BASED_OUTPATIENT_CLINIC_OR_DEPARTMENT_OTHER): Payer: BLUE CROSS/BLUE SHIELD

## 2016-01-12 ENCOUNTER — Ambulatory Visit (HOSPITAL_BASED_OUTPATIENT_CLINIC_OR_DEPARTMENT_OTHER): Payer: BLUE CROSS/BLUE SHIELD | Admitting: Nurse Practitioner

## 2016-01-12 ENCOUNTER — Encounter: Payer: Self-pay | Admitting: Pharmacist

## 2016-01-12 ENCOUNTER — Inpatient Hospital Stay (HOSPITAL_COMMUNITY)
Admission: EM | Admit: 2016-01-12 | Discharge: 2016-01-21 | DRG: 871 | Disposition: A | Discharge status: Home Health | Payer: BLUE CROSS/BLUE SHIELD | Attending: Internal Medicine | Admitting: Internal Medicine

## 2016-01-12 ENCOUNTER — Other Ambulatory Visit: Payer: Self-pay | Admitting: *Deleted

## 2016-01-12 ENCOUNTER — Ambulatory Visit
Admission: RE | Admit: 2016-01-12 | Discharge: 2016-01-12 | Disposition: A | Discharge status: Home / Self Care | Payer: BLUE CROSS/BLUE SHIELD | Source: Ambulatory Visit | Attending: Radiation Oncology | Admitting: Radiation Oncology

## 2016-01-12 ENCOUNTER — Encounter (HOSPITAL_COMMUNITY): Payer: Self-pay | Admitting: Emergency Medicine

## 2016-01-12 VITALS — BP 117/52 | HR 102 | Temp 97.3°F | Resp 24

## 2016-01-12 DIAGNOSIS — R112 Nausea with vomiting, unspecified: Secondary | ICD-10-CM

## 2016-01-12 DIAGNOSIS — Z950 Presence of cardiac pacemaker: Secondary | ICD-10-CM | POA: Diagnosis present

## 2016-01-12 DIAGNOSIS — Z7901 Long term (current) use of anticoagulants: Secondary | ICD-10-CM

## 2016-01-12 DIAGNOSIS — R6883 Chills (without fever): Secondary | ICD-10-CM | POA: Diagnosis not present

## 2016-01-12 DIAGNOSIS — N183 Chronic kidney disease, stage 3 unspecified: Secondary | ICD-10-CM | POA: Diagnosis present

## 2016-01-12 DIAGNOSIS — Z808 Family history of malignant neoplasm of other organs or systems: Secondary | ICD-10-CM

## 2016-01-12 DIAGNOSIS — Z6820 Body mass index (BMI) 20.0-20.9, adult: Secondary | ICD-10-CM

## 2016-01-12 DIAGNOSIS — I5032 Chronic diastolic (congestive) heart failure: Secondary | ICD-10-CM | POA: Diagnosis present

## 2016-01-12 DIAGNOSIS — Z809 Family history of malignant neoplasm, unspecified: Secondary | ICD-10-CM | POA: Diagnosis not present

## 2016-01-12 DIAGNOSIS — E119 Type 2 diabetes mellitus without complications: Secondary | ICD-10-CM

## 2016-01-12 DIAGNOSIS — E785 Hyperlipidemia, unspecified: Secondary | ICD-10-CM | POA: Diagnosis not present

## 2016-01-12 DIAGNOSIS — C250 Malignant neoplasm of head of pancreas: Secondary | ICD-10-CM | POA: Diagnosis not present

## 2016-01-12 DIAGNOSIS — E860 Dehydration: Secondary | ICD-10-CM

## 2016-01-12 DIAGNOSIS — I130 Hypertensive heart and chronic kidney disease with heart failure and stage 1 through stage 4 chronic kidney disease, or unspecified chronic kidney disease: Secondary | ICD-10-CM | POA: Diagnosis present

## 2016-01-12 DIAGNOSIS — Z9689 Presence of other specified functional implants: Secondary | ICD-10-CM | POA: Diagnosis present

## 2016-01-12 DIAGNOSIS — E43 Unspecified severe protein-calorie malnutrition: Secondary | ICD-10-CM | POA: Diagnosis present

## 2016-01-12 DIAGNOSIS — N3000 Acute cystitis without hematuria: Secondary | ICD-10-CM | POA: Diagnosis present

## 2016-01-12 DIAGNOSIS — Z510 Encounter for antineoplastic radiation therapy: Secondary | ICD-10-CM | POA: Diagnosis not present

## 2016-01-12 DIAGNOSIS — I4510 Unspecified right bundle-branch block: Secondary | ICD-10-CM | POA: Diagnosis present

## 2016-01-12 DIAGNOSIS — G4733 Obstructive sleep apnea (adult) (pediatric): Secondary | ICD-10-CM | POA: Diagnosis present

## 2016-01-12 DIAGNOSIS — E871 Hypo-osmolality and hyponatremia: Secondary | ICD-10-CM | POA: Diagnosis present

## 2016-01-12 DIAGNOSIS — T451X5A Adverse effect of antineoplastic and immunosuppressive drugs, initial encounter: Secondary | ICD-10-CM | POA: Diagnosis present

## 2016-01-12 DIAGNOSIS — R251 Tremor, unspecified: Secondary | ICD-10-CM | POA: Diagnosis present

## 2016-01-12 DIAGNOSIS — F17210 Nicotine dependence, cigarettes, uncomplicated: Secondary | ICD-10-CM | POA: Diagnosis not present

## 2016-01-12 DIAGNOSIS — R1900 Intra-abdominal and pelvic swelling, mass and lump, unspecified site: Secondary | ICD-10-CM

## 2016-01-12 DIAGNOSIS — Z79891 Long term (current) use of opiate analgesic: Secondary | ICD-10-CM

## 2016-01-12 DIAGNOSIS — B961 Klebsiella pneumoniae [K. pneumoniae] as the cause of diseases classified elsewhere: Secondary | ICD-10-CM | POA: Diagnosis present

## 2016-01-12 DIAGNOSIS — K520 Gastroenteritis and colitis due to radiation: Secondary | ICD-10-CM | POA: Diagnosis present

## 2016-01-12 DIAGNOSIS — Z833 Family history of diabetes mellitus: Secondary | ICD-10-CM

## 2016-01-12 DIAGNOSIS — R6510 Systemic inflammatory response syndrome (SIRS) of non-infectious origin without acute organ dysfunction: Secondary | ICD-10-CM

## 2016-01-12 DIAGNOSIS — I10 Essential (primary) hypertension: Secondary | ICD-10-CM | POA: Diagnosis present

## 2016-01-12 DIAGNOSIS — Z79899 Other long term (current) drug therapy: Secondary | ICD-10-CM

## 2016-01-12 DIAGNOSIS — Z720 Tobacco use: Secondary | ICD-10-CM | POA: Diagnosis present

## 2016-01-12 DIAGNOSIS — E876 Hypokalemia: Secondary | ICD-10-CM | POA: Diagnosis present

## 2016-01-12 DIAGNOSIS — Z8249 Family history of ischemic heart disease and other diseases of the circulatory system: Secondary | ICD-10-CM

## 2016-01-12 DIAGNOSIS — A409 Streptococcal sepsis, unspecified: Secondary | ICD-10-CM | POA: Diagnosis not present

## 2016-01-12 DIAGNOSIS — Z8042 Family history of malignant neoplasm of prostate: Secondary | ICD-10-CM

## 2016-01-12 DIAGNOSIS — H9190 Unspecified hearing loss, unspecified ear: Secondary | ICD-10-CM | POA: Diagnosis present

## 2016-01-12 DIAGNOSIS — K529 Noninfective gastroenteritis and colitis, unspecified: Secondary | ICD-10-CM | POA: Diagnosis present

## 2016-01-12 DIAGNOSIS — R197 Diarrhea, unspecified: Secondary | ICD-10-CM

## 2016-01-12 DIAGNOSIS — A419 Sepsis, unspecified organism: Secondary | ICD-10-CM | POA: Diagnosis present

## 2016-01-12 DIAGNOSIS — R6520 Severe sepsis without septic shock: Secondary | ICD-10-CM | POA: Diagnosis present

## 2016-01-12 DIAGNOSIS — I480 Paroxysmal atrial fibrillation: Secondary | ICD-10-CM

## 2016-01-12 DIAGNOSIS — R509 Fever, unspecified: Secondary | ICD-10-CM

## 2016-01-12 DIAGNOSIS — Z9221 Personal history of antineoplastic chemotherapy: Secondary | ICD-10-CM

## 2016-01-12 DIAGNOSIS — Z7984 Long term (current) use of oral hypoglycemic drugs: Secondary | ICD-10-CM

## 2016-01-12 DIAGNOSIS — F419 Anxiety disorder, unspecified: Secondary | ICD-10-CM | POA: Diagnosis not present

## 2016-01-12 DIAGNOSIS — Z66 Do not resuscitate: Secondary | ICD-10-CM | POA: Diagnosis present

## 2016-01-12 DIAGNOSIS — I441 Atrioventricular block, second degree: Secondary | ICD-10-CM | POA: Diagnosis not present

## 2016-01-12 DIAGNOSIS — E1122 Type 2 diabetes mellitus with diabetic chronic kidney disease: Secondary | ICD-10-CM | POA: Diagnosis present

## 2016-01-12 DIAGNOSIS — I4891 Unspecified atrial fibrillation: Secondary | ICD-10-CM | POA: Diagnosis present

## 2016-01-12 DIAGNOSIS — R627 Adult failure to thrive: Secondary | ICD-10-CM | POA: Diagnosis present

## 2016-01-12 DIAGNOSIS — J189 Pneumonia, unspecified organism: Secondary | ICD-10-CM | POA: Diagnosis present

## 2016-01-12 DIAGNOSIS — R188 Other ascites: Secondary | ICD-10-CM | POA: Diagnosis present

## 2016-01-12 DIAGNOSIS — D61810 Antineoplastic chemotherapy induced pancytopenia: Secondary | ICD-10-CM | POA: Diagnosis present

## 2016-01-12 LAB — CBC WITH DIFFERENTIAL/PLATELET
BASOS PCT: 0 %
Basophils Absolute: 0 10*3/uL (ref 0.0–0.1)
EOS PCT: 0 %
Eosinophils Absolute: 0 10*3/uL (ref 0.0–0.7)
HCT: 30.3 % — ABNORMAL LOW (ref 39.0–52.0)
HEMOGLOBIN: 10.6 g/dL — AB (ref 13.0–17.0)
LYMPHS PCT: 9 %
Lymphs Abs: 0.3 10*3/uL — ABNORMAL LOW (ref 0.7–4.0)
MCH: 30.9 pg (ref 26.0–34.0)
MCHC: 35 g/dL (ref 30.0–36.0)
MCV: 88.3 fL (ref 78.0–100.0)
MONOS PCT: 5 %
Monocytes Absolute: 0.2 10*3/uL (ref 0.1–1.0)
NEUTROS ABS: 3 10*3/uL (ref 1.7–7.7)
NEUTROS PCT: 86 %
Platelets: 170 10*3/uL (ref 150–400)
RBC: 3.43 MIL/uL — ABNORMAL LOW (ref 4.22–5.81)
RDW: 16.9 % — ABNORMAL HIGH (ref 11.5–15.5)
WBC: 3.5 10*3/uL — ABNORMAL LOW (ref 4.0–10.5)

## 2016-01-12 LAB — URINALYSIS, ROUTINE W REFLEX MICROSCOPIC
Bilirubin Urine: NEGATIVE
GLUCOSE, UA: NEGATIVE mg/dL
HGB URINE DIPSTICK: NEGATIVE
Ketones, ur: NEGATIVE mg/dL
LEUKOCYTES UA: NEGATIVE
Nitrite: NEGATIVE
PROTEIN: NEGATIVE mg/dL
SPECIFIC GRAVITY, URINE: 1.016 (ref 1.005–1.030)
pH: 5 (ref 5.0–8.0)

## 2016-01-12 LAB — CBG MONITORING, ED: Glucose-Capillary: 91 mg/dL (ref 65–99)

## 2016-01-12 LAB — COMPREHENSIVE METABOLIC PANEL
ALBUMIN: 2.9 g/dL — AB (ref 3.5–5.0)
ALK PHOS: 118 U/L (ref 38–126)
ALT: 19 U/L (ref 17–63)
AST: 36 U/L (ref 15–41)
Anion gap: 7 (ref 5–15)
BILIRUBIN TOTAL: 1.4 mg/dL — AB (ref 0.3–1.2)
BUN: 18 mg/dL (ref 6–20)
CO2: 18 mmol/L — AB (ref 22–32)
Calcium: 8 mg/dL — ABNORMAL LOW (ref 8.9–10.3)
Chloride: 102 mmol/L (ref 101–111)
Creatinine, Ser: 1.48 mg/dL — ABNORMAL HIGH (ref 0.61–1.24)
GFR calc Af Amer: 52 mL/min — ABNORMAL LOW (ref >60)
GFR calc non Af Amer: 45 mL/min — ABNORMAL LOW (ref >60)
GLUCOSE: 109 mg/dL — AB (ref 65–99)
POTASSIUM: 4.5 mmol/L (ref 3.5–5.1)
SODIUM: 127 mmol/L — AB (ref 135–145)
Total Protein: 6.4 g/dL — ABNORMAL LOW (ref 6.5–8.1)

## 2016-01-12 LAB — I-STAT CG4 LACTIC ACID, ED
LACTIC ACID, VENOUS: 1.64 mmol/L (ref 0.5–2.0)
LACTIC ACID, VENOUS: 0.72 mmol/L (ref 0.5–2.0)

## 2016-01-12 MED ORDER — ONDANSETRON HCL 4 MG/2ML IJ SOLN
4.0000 mg | Freq: Once | INTRAMUSCULAR | Status: AC | PRN
  Administered 2016-01-12: 4 mg via INTRAVENOUS
  Filled 2016-01-12: qty 2

## 2016-01-12 MED ORDER — IOHEXOL 300 MG/ML  SOLN
50.0000 mL | Freq: Once | INTRAMUSCULAR | Status: AC | PRN
  Administered 2016-01-12: 50 mL via ORAL

## 2016-01-12 MED ORDER — SODIUM CHLORIDE 0.9 % IV BOLUS (SEPSIS)
30.0000 mL/kg | Freq: Once | INTRAVENOUS | Status: AC
  Administered 2016-01-12: 2004 mL via INTRAVENOUS

## 2016-01-12 MED ORDER — SODIUM CHLORIDE 0.9 % IV SOLN: Freq: Once | INTRAVENOUS | Status: DC

## 2016-01-12 MED ORDER — OXYCODONE HCL 5 MG PO TABS
5.0000 mg | ORAL_TABLET | Freq: Once | ORAL | Status: AC
  Administered 2016-01-12: 5 mg via ORAL
  Filled 2016-01-12: qty 1

## 2016-01-12 MED ORDER — SODIUM CHLORIDE 0.9 % IV SOLN
Freq: Once | INTRAVENOUS | Status: AC
  Administered 2016-01-12: 15:00:00 via INTRAVENOUS

## 2016-01-12 MED ORDER — VANCOMYCIN HCL IN DEXTROSE 1-5 GM/200ML-% IV SOLN
1000.0000 mg | Freq: Once | INTRAVENOUS | Status: AC
  Administered 2016-01-12: 1000 mg via INTRAVENOUS
  Filled 2016-01-12: qty 200

## 2016-01-12 MED ORDER — VANCOMYCIN HCL 500 MG IV SOLR
500.0000 mg | Freq: Two times a day (BID) | INTRAVENOUS | Status: DC
  Administered 2016-01-13 – 2016-01-16 (×7): 500 mg via INTRAVENOUS
  Filled 2016-01-12 (×8): qty 500

## 2016-01-12 MED ORDER — PIPERACILLIN-TAZOBACTAM 3.375 G IVPB
3.3750 g | Freq: Three times a day (TID) | INTRAVENOUS | Status: DC
  Administered 2016-01-13 – 2016-01-16 (×10): 3.375 g via INTRAVENOUS
  Filled 2016-01-12 (×11): qty 50

## 2016-01-12 MED ORDER — PIPERACILLIN-TAZOBACTAM 3.375 G IVPB 30 MIN
3.3750 g | Freq: Once | INTRAVENOUS | Status: AC
  Administered 2016-01-12: 3.375 g via INTRAVENOUS
  Filled 2016-01-12: qty 50

## 2016-01-12 NOTE — ED Provider Notes (Signed)
CSN: [id number]     Arrival date & time 01/12/16  1805 History   First MD Initiated Contact with Patient 01/12/16 1818     Chief Complaint  Patient presents with  . Shaking     (Consider location/radiation/quality/duration/timing/severity/associated sxs/prior Treatment) HPI Patient gets cold and chills frequently he reports. Lately however his chills have been much more intense. He reports he is at the cancer center getting IV fluids today when he got a shaking episode that was much worse than normal. He reports his arms were clenched up and tight with shaking. He reports he was awake and aware of the event. He does not really describe his legs doing any jerking action. Patient denies headache. He has not had fever. He has had chronic difficulty with eating and drinking thus he goes to the cancer center for IV fluid administration. He takes oxycodone IR and MS Contin for pain control. He reports his pain is fairly well controlled with these medications. He takes in the evening Xanax at bedtime. He denies alcohol use. He denies seizure history. At this current time he denies significant symptoms except for feeling cold which she reports she always does and mild epigastric discomfort which he also reports as chronic. Past Medical History  Diagnosis Date  . Hypertension   . Diabetes mellitus     Diagnosed Fall 2012  . Mobitz type 2 second degree AV block     11/11/2011  . Tobacco abuse     1/2 ppd x 55 yrs  . Pacemaker 2012    Medtronic Adapta L pulse generator, serial number [id number]  . 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 Hyperlipidemia   . HOH (hard of hearing)   . Anxiety    Past Surgical History  Procedure Laterality Date  . Back surgery    . Permanent pacemaker insertion N/A 11/12/2011    Procedure: PERMANENT PACEMAKER INSERTION;  Surgeon: Deboraha Sprang, MD;  Location: [facility] CATH LAB;  Service: Cardiovascular;  Laterality: Left; Medtronic  Adapta L pulse generator, serial number [id number]    . Hernia repair  1996    left inguinal hernia repair   . Ercp N/A 10/24/2015    Procedure: ENDOSCOPIC RETROGRADE CHOLANGIOPANCREATOGRAPHY (ERCP);  Surgeon: Carol Ada, MD;  Location: [facility];  Service: Endoscopy;  Laterality: N/A;  . Biliary stent placement N/A 10/24/2015    Procedure: BILIARY STENT PLACEMENT;  Surgeon: Carol Ada, MD;  Location: [facility] ENDOSCOPY;  Service: Endoscopy;  Laterality: N/A;  . Eus N/A 11/07/2015    Procedure: FULL UPPER ENDOSCOPIC ULTRASOUND (EUS) RADIAL;  Surgeon: Carol Ada, MD;  Location: [facility] ENDOSCOPY;  Service: Endoscopy;  Laterality: N/A;  . Fine needle aspiration N/A 11/07/2015    Procedure: FINE NEEDLE ASPIRATION (FNA) LINEAR;  Surgeon: Carol Ada, MD;  Location: [facility] ENDOSCOPY;  Service: Endoscopy;  Laterality: N/A;  . Ercp N/A 11/07/2015    Procedure: ENDOSCOPIC RETROGRADE CHOLANGIOPANCREATOGRAPHY (ERCP);  Surgeon: Carol Ada, MD;  Location: [facility];  Service: Endoscopy;  Laterality: N/A;   Family History  Problem Relation Age of Onset  . Brain cancer Mother     deceased 80  . Prostate cancer Father     deceased 87  . Deep vein thrombosis Father   . Hypertension Father   . Diabetes Father   . Heart attack Neg Hx   . Hypertension Sister   . Hypertension Brother   . Cancer Father     20  .  Cancer Mother     58   Social History  Substance Use Topics  . Smoking status: Current Every Day Smoker -- 1.00 packs/day for 50 years    Types: Cigarettes  . Smokeless tobacco: Never Used  . Alcohol Use: No    Review of Systems 10 Systems reviewed and are negative for acute change except as noted in the HPI.    Allergies  Review of patient's allergies indicates no known allergies.  Home Medications   Prior to Admission medications   Medication Sig Start Date End Date Taking? Authorizing Provider  ALPRAZolam (XANAX) 0.25 MG tablet Take 0.25 mg by mouth at bedtime.  11/04/13  Yes  Historical Provider, MD  apixaban (ELIQUIS) 5 MG TABS tablet Take 1 tablet (5 mg total) by mouth 2 (two) times daily. Patient not taking: Reported on 01/16/2016 08/20/15  Yes Rhonda G Barrett, PA-C  atorvastatin (LIPITOR) 10 MG tablet Take 10 mg by mouth daily.   Yes Historical Provider, MD  capecitabine (XELODA) 500 MG tablet Take one tablet twice a day Monday to Friday with radiation. Patient not taking: Reported on 01/16/2016 12/02/15  Yes Wyatt Portela, MD  diphenoxylate-atropine (LOMOTIL) 2.5-0.025 MG tablet Take 2 tablets by mouth 4 (four) times daily as needed for diarrhea or loose stools. 01/08/16  Yes Susanne Borders, NP  metFORMIN (GLUCOPHAGE-XR) 500 MG 24 hr tablet Take 500 mg by mouth daily.    Yes Historical Provider, MD  morphine (MS CONTIN) 30 MG 12 hr tablet Take 1 tablet (30 mg total) by mouth every 12 (twelve) hours. Patient taking differently: Take 30 mg by mouth every morning.  01/02/16  Yes Hayden Pedro, PA-C  ondansetron (ZOFRAN) 8 MG tablet Take 1 tablet (8 mg total) by mouth every 8 (eight) hours as needed for nausea or vomiting. 12/26/15  Yes Kyung Rudd, MD  oxyCODONE (OXY IR/ROXICODONE) 5 MG immediate release tablet Take 1 tablet (5 mg total) by mouth every 4 (four) hours as needed for moderate pain. 12/26/15  Yes Kyung Rudd, MD  pantoprazole (PROTONIX) 40 MG tablet Take 40 mg by mouth daily.   Yes Historical Provider, MD  promethazine (PHENERGAN) 12.5 MG tablet Take 1 tablet (12.5 mg total) by mouth every 6 (six) hours as needed for nausea or vomiting. 01/02/16  Yes Hayden Pedro, PA-C  megestrol (MEGACE) 400 MG/10ML suspension Take 10 mLs (400 mg total) by mouth 2 (two) times daily. 12/30/15   Wyatt Portela, MD  olmesartan (BENICAR) 40 MG tablet Take 40 mg by mouth daily. Reported on 01/12/2016    Historical Provider, MD   BP 113/65 mmHg  Pulse 88  Temp(Src) 97.5 F (36.4 C) (Oral)  Resp 18  Ht 5\' 11"  (1.803 m)  Wt 147 lb (66.679 kg)  BMI 20.51 kg/m2  SpO2  100% Physical Exam  Constitutional: He is oriented to person, place, and time.  Patient is deconditioned in appearance. He appears slightly fatigued but has clear mental status and no respiratory distress.  HENT:  Head: Normocephalic and atraumatic.  Mouth/Throat: Oropharynx is clear and moist.  Eyes: EOM are normal. Pupils are equal, round, and reactive to light.  Neck: Neck supple.  Cardiovascular: Normal rate, regular rhythm, normal heart sounds and intact distal pulses.   Pulmonary/Chest: Effort normal.  Patient has soft breath sounds. Some fine rales at the bases.  Abdominal: Soft. Bowel sounds are normal.  Mild epigastric tenderness to palpation without guarding. Lower abdomen is nontender.  Musculoskeletal: Normal range of  motion.  Trace lower extremity edema. No calf tenderness.  Neurological: He is alert and oriented to person, place, and time. He exhibits normal muscle tone.  With holding the hands and complete extension, patient has slight tremor. He has symmetric and purposeful use of all 4 extremities. No focal motor deficit.  Skin: Skin is warm and dry.  Psychiatric:  Mood and affect is slightly depressed but cooperative and appropriate.    ED Course  Procedures (including critical care time) Labs Review Labs Reviewed  COMPREHENSIVE METABOLIC PANEL - Abnormal; Notable for the following:    Sodium 127 (*)    CO2 18 (*)    Glucose, Bld 109 (*)    Creatinine, Ser 1.48 (*)    Calcium 8.0 (*)    Total Protein 6.4 (*)    Albumin 2.9 (*)    Total Bilirubin 1.4 (*)    GFR calc non Af Amer 45 (*)    GFR calc Af Amer 52 (*)    All other components within normal limits  CBC WITH DIFFERENTIAL/PLATELET - Abnormal; Notable for the following:    WBC 3.5 (*)    RBC 3.43 (*)    Hemoglobin 10.6 (*)    HCT 30.3 (*)    RDW 16.9 (*)    Lymphs Abs 0.3 (*)    All other components within normal limits  URINALYSIS, ROUTINE W REFLEX MICROSCOPIC (NOT AT [facility]) - Abnormal; Notable for  the following:    Color, Urine AMBER (*)    APPearance CLOUDY (*)    All other components within normal limits  PROTIME-INR - Abnormal; Notable for the following:    Prothrombin Time 23.8 (*)    INR 2.14 (*)    All other components within normal limits  APTT - Abnormal; Notable for the following:    aPTT 41 (*)    All other components within normal limits  MAGNESIUM - Abnormal; Notable for the following:    Magnesium 1.3 (*)    All other components within normal limits  COMPREHENSIVE METABOLIC PANEL - Abnormal; Notable for the following:    Sodium 125 (*)    CO2 15 (*)    Glucose, Bld 101 (*)    Creatinine, Ser 1.52 (*)    Calcium 7.4 (*)    Total Protein 5.8 (*)    Albumin 2.5 (*)    ALT 15 (*)    GFR calc non Af Amer 43 (*)    GFR calc Af Amer 50 (*)    All other components within normal limits  CBC - Abnormal; Notable for the following:    RBC 2.95 (*)    Hemoglobin 9.2 (*)    HCT 25.8 (*)    RDW 17.2 (*)    Platelets 138 (*)    All other components within normal limits  HEMOGLOBIN A1C - Abnormal; Notable for the following:    Hgb A1c MFr Bld 5.7 (*)    All other components within normal limits  GLUCOSE, CAPILLARY - Abnormal; Notable for the following:    Glucose-Capillary 102 (*)    All other components within normal limits  CBC WITH DIFFERENTIAL/PLATELET - Abnormal; Notable for the following:    WBC 1.6 (*)    RBC 2.67 (*)    Hemoglobin 8.3 (*)    HCT 23.5 (*)    RDW 17.2 (*)    Platelets 109 (*)    Neutro Abs 1.2 (*)    Lymphs Abs 0.1 (*)    All other components  within normal limits  COMPREHENSIVE METABOLIC PANEL - Abnormal; Notable for the following:    Sodium 130 (*)    Potassium 3.2 (*)    CO2 17 (*)    Creatinine, Ser 1.38 (*)    Calcium 7.6 (*)    Total Protein 4.8 (*)    Albumin 2.0 (*)    ALT 13 (*)    GFR calc non Af Amer 49 (*)    GFR calc Af Amer 57 (*)    All other components within normal limits  GLUCOSE, CAPILLARY - Abnormal; Notable for  the following:    Glucose-Capillary 105 (*)    All other components within normal limits  COMPREHENSIVE METABOLIC PANEL - Abnormal; Notable for the following:    Sodium 132 (*)    Potassium 3.4 (*)    CO2 16 (*)    Creatinine, Ser 1.32 (*)    Calcium 7.5 (*)    Total Protein 4.6 (*)    Albumin 1.9 (*)    ALT 14 (*)    GFR calc non Af Amer 51 (*)    GFR calc Af Amer 60 (*)    All other components within normal limits  CBC - Abnormal; Notable for the following:    WBC 1.2 (*)    RBC 2.58 (*)    Hemoglobin 8.0 (*)    HCT 22.8 (*)    RDW 17.2 (*)    Platelets 116 (*)    All other components within normal limits  GLUCOSE, CAPILLARY - Abnormal; Notable for the following:    Glucose-Capillary 103 (*)    All other components within normal limits  GLUCOSE, CAPILLARY - Abnormal; Notable for the following:    Glucose-Capillary 133 (*)    All other components within normal limits  GLUCOSE, CAPILLARY - Abnormal; Notable for the following:    Glucose-Capillary 105 (*)    All other components within normal limits  DIFFERENTIAL - Abnormal; Notable for the following:    Neutro Abs 0.8 (*)    Lymphs Abs 0.1 (*)    All other components within normal limits  LACTATE DEHYDROGENASE, BODY FLUID - Abnormal; Notable for the following:    LD, Fluid 32 (*)    All other components within normal limits  CBC WITH DIFFERENTIAL/PLATELET - Abnormal; Notable for the following:    RBC 2.82 (*)    Hemoglobin 8.7 (*)    HCT 24.9 (*)    RDW 17.4 (*)    Platelets 120 (*)    Lymphs Abs 0.2 (*)    All other components within normal limits  COMPREHENSIVE METABOLIC PANEL - Abnormal; Notable for the following:    Sodium 133 (*)    Potassium 3.1 (*)    CO2 16 (*)    Creatinine, Ser 1.27 (*)    Calcium 7.5 (*)    Total Protein 4.7 (*)    Albumin 1.9 (*)    ALT 15 (*)    GFR calc non Af Amer 54 (*)    All other components within normal limits  MAGNESIUM - Abnormal; Notable for the following:    Magnesium  1.6 (*)    All other components within normal limits  GLUCOSE, CAPILLARY - Abnormal; Notable for the following:    Glucose-Capillary 100 (*)    All other components within normal limits  GLUCOSE, CAPILLARY - Abnormal; Notable for the following:    Glucose-Capillary 104 (*)    All other components within normal limits  CBC WITH DIFFERENTIAL/PLATELET - Abnormal;  Notable for the following:    RBC 2.88 (*)    Hemoglobin 9.0 (*)    HCT 25.6 (*)    RDW 17.8 (*)    Platelets 119 (*)    Lymphs Abs 0.3 (*)    All other components within normal limits  BASIC METABOLIC PANEL - Abnormal; Notable for the following:    Sodium 134 (*)    Chloride 113 (*)    CO2 17 (*)    Calcium 7.6 (*)    GFR calc non Af Amer 56 (*)    Anion gap 4 (*)    All other components within normal limits  GLUCOSE, CAPILLARY - Abnormal; Notable for the following:    Glucose-Capillary 112 (*)    All other components within normal limits  GLUCOSE, CAPILLARY - Abnormal; Notable for the following:    Glucose-Capillary 106 (*)    All other components within normal limits  BASIC METABOLIC PANEL - Abnormal; Notable for the following:    CO2 20 (*)    Calcium 7.7 (*)    All other components within normal limits  CBC - Abnormal; Notable for the following:    RBC 2.84 (*)    Hemoglobin 8.9 (*)    HCT 25.2 (*)    RDW 17.8 (*)    Platelets 126 (*)    All other components within normal limits  GLUCOSE, CAPILLARY - Abnormal; Notable for the following:    Glucose-Capillary 102 (*)    All other components within normal limits  BASIC METABOLIC PANEL - Abnormal; Notable for the following:    Potassium 3.1 (*)    Calcium 7.5 (*)    GFR calc non Af Amer 59 (*)    All other components within normal limits  CBC - Abnormal; Notable for the following:    RBC 2.74 (*)    Hemoglobin 8.6 (*)    HCT 24.3 (*)    RDW 17.7 (*)    Platelets 123 (*)    All other components within normal limits  CULTURE, BLOOD (ROUTINE X 2)  CULTURE,  BLOOD (ROUTINE X 2)  URINE CULTURE  GASTROINTESTINAL PANEL BY PCR, STOOL (REPLACES STOOL CULTURE)  C DIFFICILE QUICK SCREEN W PCR REFLEX  ANAEROBIC CULTURE  INFLUENZA PANEL BY PCR (TYPE A & B, H1N1)  PROCALCITONIN  PHOSPHORUS  TSH  GLUCOSE, CAPILLARY  GLUCOSE, CAPILLARY  GLUCOSE, CAPILLARY  GLUCOSE, CAPILLARY  MAGNESIUM  GLUCOSE, CAPILLARY  GLUCOSE, CAPILLARY  GLUCOSE, CAPILLARY  GLUCOSE, CAPILLARY  MAGNESIUM  GLUCOSE, CAPILLARY  GLUCOSE, CAPILLARY  GLUCOSE, CAPILLARY  VANCOMYCIN, TROUGH  GLUCOSE, CAPILLARY  PROTEIN, BODY FLUID  TRIGLYCERIDES, BODY FLUIDS  BODY FLUID CELL COUNT WITH DIFFERENTIAL  GLUCOSE, SEROUS FLUID  GLUCOSE, CAPILLARY  GLUCOSE, CAPILLARY  GLUCOSE, CAPILLARY  GLUCOSE, CAPILLARY  GLUCOSE, CAPILLARY  MAGNESIUM  GLUCOSE, CAPILLARY  GLUCOSE, CAPILLARY  GLUCOSE, CAPILLARY  GLUCOSE, CAPILLARY  MAGNESIUM  GLUCOSE, CAPILLARY  GLUCOSE, CAPILLARY  GLUCOSE, CAPILLARY  GLUCOSE, CAPILLARY  GLUCOSE, CAPILLARY  GLUCOSE, CAPILLARY  MAGNESIUM  GLUCOSE, CAPILLARY  GLUCOSE, CAPILLARY  GLUCOSE, CAPILLARY  GLUCOSE, CAPILLARY  I-STAT CG4 LACTIC ACID, ED  CBG MONITORING, ED  I-STAT CG4 LACTIC ACID, ED  CYTOLOGY - NON PAP    Imaging Review No results found. I have personally reviewed and evaluated these images and lab results as part of my medical decision-making.   EKG Interpretation   Date/Time:  Monday January 12 2016 23:08:47 EST Ventricular Rate:  85 PR Interval:  170 QRS Duration: 146 QT Interval:  391  QTC Calculation: 465 R Axis:   -64 Text Interpretation:  Atrial-paced complexes RBBB and LAFB Abnormal T,  consider ischemia, lateral leads Baseline wander in lead(s) V5 since last  tracing no significant change Confirmed by BELFI  MD, MELANIE ([id number]) on  01/13/2016 12:22:19 AM      MDM   Final diagnoses:  Malignant neoplasm of head of pancreas (HCC)  Tremor of both hands  Other specified fever   Patient came from the cancer center  with shaking. This is been something that has been occurring off and on. He subsequently developed fever after being in the emergency department. At this time sepsis treatment will be initiated. No clear source.    Charlesetta Shanks, MD 01/19/16 [phone]

## 2016-01-12 NOTE — Progress Notes (Signed)
At approx. 5pm noted to be shaking/tremulous all over as if he had chills. Has vomited about 15 minutes prior to this. VS obtained and noted to have tachycardia and decrease in BP. See flow sheet. C/o of only a 'little bit' of abd discomfort.  Over weekend he had several episodes of diarrhea, vomiting. Not able to eat much or drink much. He is currently on his 2nd liter of saline for re-hydration.   Notified Selena Lesser, and she came to evaluate pt. After her discussion with Dr. Julien Nordmann, decision made to take pt to ED for Evaluation-labs etc. IV remains with NS @ [facility].

## 2016-01-12 NOTE — ED Notes (Signed)
Bed: WA05 Expected date:  Expected time:  Means of arrival:  Comments: [facility] from [facility] 

## 2016-01-12 NOTE — Patient Instructions (Signed)

## 2016-01-12 NOTE — ED Notes (Signed)
Pt sent from cancer center. Pt was getting IV fluids today when at 1400 he began to have feelings of coldness and shaking. Pt being treated for pancreatic ca, on home chemo pill and had radiation today. Felt well prior to receiving IV fluids.

## 2016-01-12 NOTE — H&P (Signed)
PCP: Woody Seller, MD  [place]   Referring provider  [place]   Chief Complaint: Chills  HPI: Antonio Hancock is a 75 y.o. male   has a past medical history of Hypertension; Diabetes mellitus; Mobitz type 2 second degree AV block; Tobacco abuse; Pacemaker (2012); Chronotropic incompetence with sinus node dysfunction ([place]) (2012); Hyperlipidemia; HOH (hard of hearing); and Anxiety.   Presented with chills while undergoing fluids infusion at cancer center. Patient has been dealing with diarrhea side effects of radiation therapy for his pancreatic cancer and has   required repeated visits to oncology Center for fluids. Begin controlling his diarrhea with Imodium and was given a prescription for Lomotil. He's been having some nausea and vomiting. Started to have chills tonight his vital signs were checked while at [place] he was noted to be tachycardic and hypotensive while being on his second liter of normal saline at the clinic. Deneis any significant cough, no chest pain, no sick contacts  IN ER: Patient developed fever while in emergency department up to 102.5 heart rate as high as 103 respirations up to 24. Blood pressure going systolics going down to mid 80s lactic acid unremarkable 2. UA and chest x-ray both did not show any evidence of infection. Influenza PCR was ordered   Regarding pertinent past history: Patient has history of locally advanced pancreatic cancer diagnosed in November 2016 currently undergoing weekly radiation therapy status post ERCP and stent placement for biliary obstruction. currently on Xeloda. Patient has history of atrial fibrillation anticoagulated with Eliquis. He sp pacemaker due to symptomatic bradycardia.  Patient used to have history of hypertension but had to discontinue all his blood pressure medications in the past secondary to recurrent hypotension in a setting of dehydration. Also had history of diabetes but given massive  weight loss no longer needs any medications  Hospitalist was called for admission for persistent nausea vomiting diarrhea, resulting in dehydration with evidence of SIRS  Review of Systems:    Pertinent positives include:  Fevers, chills, fatigue, weight loss  abdominal pain, nausea, vomiting, diarrhea,   Constitutional:  No weight loss, night sweats, HEENT:  No headaches, Difficulty swallowing,Tooth/dental problems,Sore throat,  No sneezing, itching, ear ache, nasal congestion, post nasal drip,  Cardio-vascular:  No chest pain, Orthopnea, PND, anasarca, dizziness, palpitations.no Bilateral lower extremity swelling  GI:  No heartburn, indigestion,change in bowel habits, loss of appetite, melena, blood in stool, hematemesis Resp:  no shortness of breath at rest. No dyspnea on exertion, No excess mucus, no productive cough, No non-productive cough, No coughing up of blood.No change in color of mucus.No wheezing. Skin:  no rash or lesions. No jaundice GU:  no dysuria, change in color of urine, no urgency or frequency. No straining to urinate.  No flank pain.  Musculoskeletal:  No joint pain or no joint swelling. No decreased range of motion. No back pain.  Psych:  No change in mood or affect. No depression or anxiety. No memory loss.  Neuro: no localizing neurological complaints, no tingling, no weakness, no double vision, no gait abnormality, no slurred speech, no confusion  Otherwise ROS are negative except for above, 10 systems were reviewed  Past Medical History: Past Medical History  Diagnosis Date  . Hypertension   . Diabetes mellitus     Diagnosed Fall 2012  . Mobitz type 2 second degree AV block     11/11/2011  . Tobacco abuse     1/2 ppd x 55 yrs  .  Pacemaker 2012    Medtronic Adapta L pulse generator, serial number [id number]  . Chronotropic incompetence with sinus node dysfunction ([place]) 2012    s/p MDT PPM  . Hyperlipidemia   . HOH (hard of hearing)   . Anxiety     Past Surgical History  Procedure Laterality Date  . Back surgery    . Permanent pacemaker insertion N/A 11/12/2011    Procedure: PERMANENT PACEMAKER INSERTION;  Surgeon: Deboraha Sprang, MD;  Location: [facility] CATH LAB;  Service: Cardiovascular;  Laterality: Left; Medtronic Adapta L pulse generator, serial number [id number]    . Hernia repair  1996    left inguinal hernia repair   . Ercp N/A 10/24/2015    Procedure: ENDOSCOPIC RETROGRADE CHOLANGIOPANCREATOGRAPHY (ERCP);  Surgeon: Carol Ada, MD;  Location: [facility];  Service: Endoscopy;  Laterality: N/A;  . Biliary stent placement N/A 10/24/2015    Procedure: BILIARY STENT PLACEMENT;  Surgeon: Carol Ada, MD;  Location: [facility] ENDOSCOPY;  Service: Endoscopy;  Laterality: N/A;  . Eus N/A 11/07/2015    Procedure: FULL UPPER ENDOSCOPIC ULTRASOUND (EUS) RADIAL;  Surgeon: Carol Ada, MD;  Location: [facility] ENDOSCOPY;  Service: Endoscopy;  Laterality: N/A;  . Fine needle aspiration N/A 11/07/2015    Procedure: FINE NEEDLE ASPIRATION (FNA) LINEAR;  Surgeon: Carol Ada, MD;  Location: [facility] ENDOSCOPY;  Service: Endoscopy;  Laterality: N/A;  . Ercp N/A 11/07/2015    Procedure: ENDOSCOPIC RETROGRADE CHOLANGIOPANCREATOGRAPHY (ERCP);  Surgeon: Carol Ada, MD;  Location: [facility];  Service: Endoscopy;  Laterality: N/A;     Medications: Prior to Admission medications   Medication Sig Start Date End Date Taking? Authorizing Provider  ALPRAZolam (XANAX) 0.25 MG tablet Take 0.25 mg by mouth at bedtime.  11/04/13  Yes Historical Provider, MD  apixaban (ELIQUIS) 5 MG TABS tablet Take 1 tablet (5 mg total) by mouth 2 (two) times daily. 08/20/15  Yes Rhonda G Barrett, PA-C  atorvastatin (LIPITOR) 10 MG tablet Take 10 mg by mouth daily.   Yes Historical Provider, MD  capecitabine (XELODA) 500 MG tablet Take one tablet twice a day Monday to Friday with radiation. Patient taking differently: Take 500 mg by mouth as directed. Take one tablet twice a day Monday -  Friday with radiation. 12/02/15  Yes Wyatt Portela, MD  diphenoxylate-atropine (LOMOTIL) 2.5-0.025 MG tablet Take 2 tablets by mouth 4 (four) times daily as needed for diarrhea or loose stools. 01/08/16  Yes Susanne Borders, NP  metFORMIN (GLUCOPHAGE-XR) 500 MG 24 hr tablet Take 500 mg by mouth daily.    Yes Historical Provider, MD  morphine (MS CONTIN) 30 MG 12 hr tablet Take 1 tablet (30 mg total) by mouth every 12 (twelve) hours. Patient taking differently: Take 30 mg by mouth every morning.  01/02/16  Yes Hayden Pedro, PA-C  ondansetron (ZOFRAN) 8 MG tablet Take 1 tablet (8 mg total) by mouth every 8 (eight) hours as needed for nausea or vomiting. 12/26/15  Yes Kyung Rudd, MD  oxyCODONE (OXY IR/ROXICODONE) 5 MG immediate release tablet Take 1 tablet (5 mg total) by mouth every 4 (four) hours as needed for moderate pain. 12/26/15  Yes Kyung Rudd, MD  pantoprazole (PROTONIX) 40 MG tablet Take 40 mg by mouth daily.   Yes Historical Provider, MD  promethazine (PHENERGAN) 12.5 MG tablet Take 1 tablet (12.5 mg total) by mouth every 6 (six) hours as needed for nausea or vomiting. 01/02/16  Yes Hayden Pedro, PA-C  megestrol (MEGACE) 400 MG/10ML suspension Take 10 mLs (  400 mg total) by mouth 2 (two) times daily. Patient not taking: Reported on 01/08/2016 12/30/15   Wyatt Portela, MD  olmesartan (BENICAR) 40 MG tablet Take 40 mg by mouth daily. Reported on 01/12/2016    Historical Provider, MD    Allergies:  No Known Allergies  Social History:  Ambulatory   Independently  Lives at home  With family     reports that he has been smoking Cigarettes.  He has a 50 pack-year smoking history. He has never used smokeless tobacco. He reports that he does not drink alcohol or use illicit drugs.     Family History: family history includes Brain cancer in his mother; Cancer in his father and mother; Deep vein thrombosis in his father; Diabetes in his father; Hypertension in his brother, father, and  sister; Prostate cancer in his father. There is no history of Heart attack.    Physical Exam: Patient Vitals for the past 24 hrs:  BP Temp Temp src Pulse Resp SpO2 Height Weight  01/12/16 2257 - - - - - - 5\' 11"  (1.803 m) 66.679 kg (147 lb)  01/12/16 2100 103/57 mmHg - - 96 16 95 % - -  01/12/16 2030 114/60 mmHg - - 92 16 95 % - -  01/12/16 2000 100/55 mmHg - - 100 16 92 % - -  01/12/16 1933 - 102.5 F (39.2 C) Rectal - - - - -  01/12/16 1811 123/65 mmHg 98.6 F (37 C) Oral 95 16 99 % - -    1. General:  in No Acute distress 2. Psychological: Alert and  Oriented 3. Head/ENT:     Dry Mucous Membranes                          Head Non traumatic, neck supple                           Poor Dentition 4. SKIN:   decreased Skin turgor,  Skin clean Dry and intact no rash 5. Heart: Regular rate and rhythm no Murmur, Rub or gallop 6. Lungs: Clear to auscultation bilaterally, no wheezes or crackles   7. Abdomen: Soft, non-tender, Non distended 8. Lower extremities: no clubbing, cyanosis, or edema 9. Neurologically Grossly intact, moving all 4 extremities equally 10. MSK: Normal range of motion  body mass index is 20.51 kg/(m^2).   Labs on Admission:   Results for orders placed or performed during the hospital encounter of 01/12/16 (from the past 24 hour(s))  CBG monitoring, ED     Status: None   Collection Time: 01/12/16  6:15 PM  Result Value Ref Range   Glucose-Capillary 91 65 - 99 mg/dL  Comprehensive metabolic panel     Status: Abnormal   Collection Time: 01/12/16  6:24 PM  Result Value Ref Range   Sodium 127 (L) 135 - 145 mmol/L   Potassium 4.5 3.5 - 5.1 mmol/L   Chloride 102 101 - 111 mmol/L   CO2 18 (L) 22 - 32 mmol/L   Glucose, Bld 109 (H) 65 - 99 mg/dL   BUN 18 6 - 20 mg/dL   Creatinine, Ser 1.48 (H) 0.61 - 1.24 mg/dL   Calcium 8.0 (L) 8.9 - 10.3 mg/dL   Total Protein 6.4 (L) 6.5 - 8.1 g/dL   Albumin 2.9 (L) 3.5 - 5.0 g/dL   AST 36 15 - 41 U/L   ALT 19 17 -  63 U/L     Alkaline Phosphatase 118 38 - 126 U/L   Total Bilirubin 1.4 (H) 0.3 - 1.2 mg/dL   GFR calc non Af Amer 45 (L) >60 mL/min   GFR calc Af Amer 52 (L) >60 mL/min   Anion gap 7 5 - 15  CBC with Differential     Status: Abnormal   Collection Time: 01/12/16  6:24 PM  Result Value Ref Range   WBC 3.5 (L) 4.0 - 10.5 K/uL   RBC 3.43 (L) 4.22 - 5.81 MIL/uL   Hemoglobin 10.6 (L) 13.0 - 17.0 g/dL   HCT 30.3 (L) 39.0 - 52.0 %   MCV 88.3 78.0 - 100.0 fL   MCH 30.9 26.0 - 34.0 pg   MCHC 35.0 30.0 - 36.0 g/dL   RDW 16.9 (H) 11.5 - 15.5 %   Platelets 170 150 - 400 K/uL   Neutrophils Relative % 86 %   Lymphocytes Relative 9 %   Monocytes Relative 5 %   Eosinophils Relative 0 %   Basophils Relative 0 %   Neutro Abs 3.0 1.7 - 7.7 K/uL   Lymphs Abs 0.3 (L) 0.7 - 4.0 K/uL   Monocytes Absolute 0.2 0.1 - 1.0 K/uL   Eosinophils Absolute 0.0 0.0 - 0.7 K/uL   Basophils Absolute 0.0 0.0 - 0.1 K/uL   RBC Morphology TARGET CELLS    Smear Review LARGE PLATELETS PRESENT   I-Stat CG4 Lactic Acid, ED (Not at [facility])     Status: None   Collection Time: 01/12/16  6:41 PM  Result Value Ref Range   Lactic Acid, Venous 1.64 0.5 - 2.0 mmol/L  Urinalysis, Routine w reflex microscopic (not at [facility])     Status: Abnormal   Collection Time: 01/12/16  8:31 PM  Result Value Ref Range   Color, Urine AMBER (A) YELLOW   APPearance CLOUDY (A) CLEAR   Specific Gravity, Urine 1.016 1.005 - 1.030   pH 5.0 5.0 - 8.0   Glucose, UA NEGATIVE NEGATIVE mg/dL   Hgb urine dipstick NEGATIVE NEGATIVE   Bilirubin Urine NEGATIVE NEGATIVE   Ketones, ur NEGATIVE NEGATIVE mg/dL   Protein, ur NEGATIVE NEGATIVE mg/dL   Nitrite NEGATIVE NEGATIVE   Leukocytes, UA NEGATIVE NEGATIVE  I-Stat CG4 Lactic Acid, ED (Not at [facility])     Status: None   Collection Time: 01/12/16  9:27 PM  Result Value Ref Range   Lactic Acid, Venous 0.72 0.5 - 2.0 mmol/L    UA    no evidence of UTI  No results found for: HGBA1C  Estimated Creatinine Clearance:  41.3 mL/min (by C-G formula based on Cr of 1.48).  BNP (last 3 results) No results for input(s): PROBNP in the last 8760 hours.  Other results:  I have pearsonaly reviewed this: ECG REPORT  Rate:85  Rhythm: Atrial paced right bundle branch block ST&T Change: No ischemic changes QTC 465  Filed Weights   01/12/16 2257  Weight: 66.679 kg (147 lb)     Cultures: No results found for: SDES, SPECREQUEST, CULT, REPTSTATUS   Radiological Exams on Admission: Dg Chest 2 View  01/12/2016  CLINICAL DATA:  75 year old male with sepsis EXAM: CHEST  2 VIEW COMPARISON:  Chest CT dated 11/25/2015 FINDINGS: Two views of the chest do not demonstrate a focal consolidation. There is no pleural effusion or pneumothorax. Bibasilar atelectatic changes most prominent on the right. The cardiac silhouette is within normal limits. Left pectoral pacemaker device. There is osteopenia with degenerative changes of the  spine. Thoracolumbar fixation hardware noted. No acute osseous pathology. IMPRESSION: No active cardiopulmonary disease. Electronically Signed   By: Anner Crete M.D.   On: 01/12/2016 18:58    Chart has been reviewed  Family  at  Bedside  plan of care was discussed with  Wife Faison Opheim [phone] home  Assessment/Plan  75 year-old gentleman history of pancreatic cancer currently on by mouth chemotherapy and radiation therapy presents with worsening nausea vomiting diarrhea fever with evidence of SIRS   Present on Admission:  . SIRS (systemic inflammatory response syndrome) (HCC) etiology unclear given persistent nausea vomiting or diarrhea develops a CT scan of abdomen. Obtain stool studies. Check for influenza. For now will broadly cover vancomycin and Zosyn and await results of the blood cultures  . Malignant neoplasm of head of pancreas [facility]) -   oncology consult in the morning for right now hold Xeloda  . Pacemaker-Medtronic -stable currently appears to be stable rhythm  .  Hypertension hold home blood pressure medications given soft blood pressures  . Dehydration and minister IV fluids and check orthostatics tomorrow  . Atrial fibrillation ([place]) continue anticoagulation currently appears to be  rate controlled rhythm   . Chronic diastolic CHF (congestive heart failure), NYHA class 1 (HCC) stable appears to be dehydrated we will administer IV fluids but watch for fluid overload  . CKD (chronic kidney disease) stage 3, GFR 30-59 ml/min creatinine is currently at baseline continue to monitor . Tobacco abuse recommended cessation order nicotine patch  Prophylaxis: Eliquis   CODE STATUS:    DNR/DNI as per patient    Disposition:  To home once workup is complete and patient is stable  Other plan as per orders.  I have spent a total of 56 min on this admission     Ivey Cina 01/12/2016, 11:51 PM    [facility]  Pager [phone]   after 2 AM please page floor coverage PA If 7AM-7PM, please contact the day team taking care of the patient  [email or web address]  Password [email or web address]

## 2016-01-12 NOTE — ED Notes (Signed)
Pt, being sent by [facility], c/o n/v/d x a few days and "shaking" chills starting this afternoon.  Pt was being seen at [facility] for receive fluids.  Pt has become Tachy and BP has dropped.  Pt has oral chemo and radiation daily.  Hx of pancreatic CA.

## 2016-01-12 NOTE — Progress Notes (Signed)
Oral Chemotherapy Follow-Up Form  Original Start date of oral chemotherapy: 1/5/2017__   Called patient today to follow up regarding patient's oral chemotherapy medication: _Xeloda + radiation___  He has recently had some nausea/vomiting the last day. He has phenergan and zofran prescription have counseled on how to use properly but wife states he may not be following instructions. She has been trying to have Mr. Drozdowski take his nausea medications and some toast or crackers before taking xeloda but Mr. Pavelich has not been doing this.  Instructed Mr. Crofts to alternate zofran and phenergan every 3-4 hours for the next few days. He is here today for radiation and fluids. This is his last week of radiation treatment  Pt reports _0_ tablets/doses missed in the last week/month.    Pt reports the following side effects: _Nasuea/Vomiting_____    Will follow up and call patient again in _1 week___   Thank you,  Montel Clock, PharmD, [facility]

## 2016-01-12 NOTE — Progress Notes (Signed)
Pharmacy Antibiotic Note  Antonio Hancock is a 75 y.o. male admitted on 01/12/2016 with sepsis.  Pharmacy has been consulted for Zosyn/Vancomycin dosing.  Plan: Vancomycin 1Gm x1 then 500 IV every 12 hours.  Goal trough 15-20 mcg/mL. Zosyn 3.375g IV q8h (4 hour infusion).  Height: 5\' 11"  (180.3 cm) Weight: 147 lb (66.679 kg) IBW/kg (Calculated) : 75.3  Temp (24hrs), Avg:98.8 F (37.1 C), Min:97.3 F (36.3 C), Max:102.5 F (39.2 C)   Recent Labs Lab 01/08/16 1139 01/12/16 1824 01/12/16 1841 01/12/16 2127  WBC 2.6* 3.5*  --   --   CREATININE 1.7* 1.48*  --   --   LATICACIDVEN  --   --  1.64 0.72    Estimated Creatinine Clearance: 41.3 mL/min (by C-G formula based on Cr of 1.48).    No Known Allergies  Antimicrobials this admission: 2/6 zosyn >>  2/6 vancomycin >>   Dose adjustments this admission:   Microbiology results:  BCx:   UCx:  Sputum:   MRSA PCR:   Thank you for allowing pharmacy to be a part of this patient's care.  Dorrene German 01/12/2016 11:20 PM

## 2016-01-13 ENCOUNTER — Ambulatory Visit: Payer: BLUE CROSS/BLUE SHIELD

## 2016-01-13 ENCOUNTER — Observation Stay (HOSPITAL_COMMUNITY): Payer: BLUE CROSS/BLUE SHIELD

## 2016-01-13 ENCOUNTER — Ambulatory Visit
Admit: 2016-01-13 | Discharge: 2016-01-13 | Disposition: A | Discharge status: Home / Self Care | Payer: BLUE CROSS/BLUE SHIELD | Attending: Radiation Oncology | Admitting: Radiation Oncology

## 2016-01-13 ENCOUNTER — Telehealth: Payer: Self-pay | Admitting: Radiation Oncology

## 2016-01-13 ENCOUNTER — Encounter (HOSPITAL_COMMUNITY): Payer: Self-pay

## 2016-01-13 ENCOUNTER — Encounter: Payer: Self-pay | Admitting: Nurse Practitioner

## 2016-01-13 DIAGNOSIS — A419 Sepsis, unspecified organism: Secondary | ICD-10-CM

## 2016-01-13 DIAGNOSIS — R112 Nausea with vomiting, unspecified: Secondary | ICD-10-CM | POA: Insufficient documentation

## 2016-01-13 DIAGNOSIS — J189 Pneumonia, unspecified organism: Secondary | ICD-10-CM | POA: Diagnosis present

## 2016-01-13 DIAGNOSIS — R6520 Severe sepsis without septic shock: Secondary | ICD-10-CM

## 2016-01-13 DIAGNOSIS — K529 Noninfective gastroenteritis and colitis, unspecified: Secondary | ICD-10-CM | POA: Diagnosis present

## 2016-01-13 DIAGNOSIS — R6883 Chills (without fever): Secondary | ICD-10-CM | POA: Insufficient documentation

## 2016-01-13 DIAGNOSIS — R188 Other ascites: Secondary | ICD-10-CM | POA: Diagnosis present

## 2016-01-13 LAB — COMPREHENSIVE METABOLIC PANEL
ALK PHOS: 101 U/L (ref 38–126)
ALT: 15 U/L — AB (ref 17–63)
AST: 40 U/L (ref 15–41)
Albumin: 2.5 g/dL — ABNORMAL LOW (ref 3.5–5.0)
Anion gap: 8 (ref 5–15)
BUN: 18 mg/dL (ref 6–20)
CALCIUM: 7.4 mg/dL — AB (ref 8.9–10.3)
CO2: 15 mmol/L — AB (ref 22–32)
CREATININE: 1.52 mg/dL — AB (ref 0.61–1.24)
Chloride: 102 mmol/L (ref 101–111)
GFR, EST AFRICAN AMERICAN: 50 mL/min — AB (ref >60)
GFR, EST NON AFRICAN AMERICAN: 43 mL/min — AB (ref >60)
Glucose, Bld: 101 mg/dL — ABNORMAL HIGH (ref 65–99)
Potassium: 4 mmol/L (ref 3.5–5.1)
SODIUM: 125 mmol/L — AB (ref 135–145)
Total Bilirubin: 1.2 mg/dL (ref 0.3–1.2)
Total Protein: 5.8 g/dL — ABNORMAL LOW (ref 6.5–8.1)

## 2016-01-13 LAB — GASTROINTESTINAL PANEL BY PCR, STOOL (REPLACES STOOL CULTURE)
ADENOVIRUS F40/41: NOT DETECTED
ASTROVIRUS: NOT DETECTED
CAMPYLOBACTER SPECIES: NOT DETECTED
Cryptosporidium: NOT DETECTED
Cyclospora cayetanensis: NOT DETECTED
E. coli O157: NOT DETECTED
ENTEROAGGREGATIVE E COLI (EAEC): NOT DETECTED
ENTEROPATHOGENIC E COLI (EPEC): NOT DETECTED
ENTEROTOXIGENIC E COLI (ETEC): NOT DETECTED
Entamoeba histolytica: NOT DETECTED
GIARDIA LAMBLIA: NOT DETECTED
NOROVIRUS GI/GII: NOT DETECTED
PLESIMONAS SHIGELLOIDES: NOT DETECTED
Rotavirus A: NOT DETECTED
SHIGELLA/ENTEROINVASIVE E COLI (EIEC): NOT DETECTED
Salmonella species: NOT DETECTED
Sapovirus (I, II, IV, and V): NOT DETECTED
Shiga like toxin producing E coli (STEC): NOT DETECTED
Vibrio cholerae: NOT DETECTED
Vibrio species: NOT DETECTED
Yersinia enterocolitica: NOT DETECTED

## 2016-01-13 LAB — CBC
HCT: 25.8 % — ABNORMAL LOW (ref 39.0–52.0)
Hemoglobin: 9.2 g/dL — ABNORMAL LOW (ref 13.0–17.0)
MCH: 31.2 pg (ref 26.0–34.0)
MCHC: 35.7 g/dL (ref 30.0–36.0)
MCV: 87.5 fL (ref 78.0–100.0)
PLATELETS: 138 10*3/uL — AB (ref 150–400)
RBC: 2.95 MIL/uL — AB (ref 4.22–5.81)
RDW: 17.2 % — ABNORMAL HIGH (ref 11.5–15.5)
WBC: 4.1 10*3/uL (ref 4.0–10.5)

## 2016-01-13 LAB — C DIFFICILE QUICK SCREEN W PCR REFLEX
C DIFFICILE (CDIFF) INTERP: NEGATIVE
C Diff antigen: NEGATIVE
C Diff toxin: NEGATIVE

## 2016-01-13 LAB — GLUCOSE, CAPILLARY
GLUCOSE-CAPILLARY: 89 mg/dL (ref 65–99)
GLUCOSE-CAPILLARY: 86 mg/dL (ref 65–99)
GLUCOSE-CAPILLARY: 81 mg/dL (ref 65–99)
GLUCOSE-CAPILLARY: 102 mg/dL — AB (ref 65–99)
GLUCOSE-CAPILLARY: 105 mg/dL — AB (ref 65–99)
Glucose-Capillary: 78 mg/dL (ref 65–99)

## 2016-01-13 LAB — INFLUENZA PANEL BY PCR (TYPE A & B)
H1N1FLUPCR: NOT DETECTED
INFLBPCR: NEGATIVE
Influenza A By PCR: NEGATIVE

## 2016-01-13 LAB — MAGNESIUM: Magnesium: 1.3 mg/dL — ABNORMAL LOW (ref 1.7–2.4)

## 2016-01-13 LAB — PROTIME-INR
INR: 2.14 — AB (ref 0.00–1.49)
Prothrombin Time: 23.8 seconds — ABNORMAL HIGH (ref 11.6–15.2)

## 2016-01-13 LAB — APTT: APTT: 41 s — AB (ref 24–37)

## 2016-01-13 LAB — TSH: TSH: 1.47 u[IU]/mL (ref 0.350–4.500)

## 2016-01-13 LAB — PROCALCITONIN: PROCALCITONIN: 0.24 ng/mL

## 2016-01-13 LAB — PHOSPHORUS: Phosphorus: 2.7 mg/dL (ref 2.5–4.6)

## 2016-01-13 MED ORDER — BOOST / RESOURCE BREEZE PO LIQD
1.0000 | Freq: Three times a day (TID) | ORAL | Status: DC
Start: 2016-01-13 — End: 2016-01-16
  Administered 2016-01-13 – 2016-01-14 (×2): 1 via ORAL

## 2016-01-13 MED ORDER — SODIUM CHLORIDE 0.9 % IV BOLUS (SEPSIS)
1000.0000 mL | Freq: Once | INTRAVENOUS | Status: AC
  Administered 2016-01-13: 1000 mL via INTRAVENOUS

## 2016-01-13 MED ORDER — ALPRAZOLAM 0.25 MG PO TABS
0.2500 mg | ORAL_TABLET | Freq: Every day | ORAL | Status: DC
  Administered 2016-01-13 – 2016-01-20 (×9): 0.25 mg via ORAL
  Filled 2016-01-13 (×9): qty 1

## 2016-01-13 MED ORDER — ATORVASTATIN CALCIUM 10 MG PO TABS
10.0000 mg | ORAL_TABLET | Freq: Every day | ORAL | Status: DC
  Administered 2016-01-13 – 2016-01-20 (×8): 10 mg via ORAL
  Filled 2016-01-13 (×9): qty 1

## 2016-01-13 MED ORDER — INSULIN ASPART 100 UNIT/ML ~~LOC~~ SOLN
0.0000 [IU] | SUBCUTANEOUS | Status: DC
Start: 2016-01-13 — End: 2016-01-21
  Administered 2016-01-14: 1 [IU] via SUBCUTANEOUS

## 2016-01-13 MED ORDER — MORPHINE SULFATE ER 30 MG PO TBCR
30.0000 mg | EXTENDED_RELEASE_TABLET | Freq: Every morning | ORAL | Status: DC
  Administered 2016-01-13 – 2016-01-21 (×8): 30 mg via ORAL
  Filled 2016-01-13 (×8): qty 1

## 2016-01-13 MED ORDER — LOPERAMIDE HCL 2 MG PO CAPS
4.0000 mg | ORAL_CAPSULE | Freq: Once | ORAL | Status: AC
  Administered 2016-01-13: 4 mg via ORAL
  Filled 2016-01-13: qty 2

## 2016-01-13 MED ORDER — ACETAMINOPHEN 650 MG RE SUPP: 650.0000 mg | Freq: Four times a day (QID) | RECTAL | Status: DC | PRN

## 2016-01-13 MED ORDER — PANTOPRAZOLE SODIUM 40 MG PO TBEC
40.0000 mg | DELAYED_RELEASE_TABLET | Freq: Every day | ORAL | Status: DC
  Administered 2016-01-13 – 2016-01-21 (×8): 40 mg via ORAL
  Filled 2016-01-13 (×10): qty 1

## 2016-01-13 MED ORDER — SODIUM CHLORIDE 0.9 % IV SOLN
INTRAVENOUS | Status: AC
  Administered 2016-01-13: 03:00:00 via INTRAVENOUS

## 2016-01-13 MED ORDER — ACETAMINOPHEN 325 MG PO TABS: 650.0000 mg | ORAL_TABLET | Freq: Four times a day (QID) | ORAL | Status: DC | PRN

## 2016-01-13 MED ORDER — MAGNESIUM SULFATE 2 GM/50ML IV SOLN
2.0000 g | Freq: Once | INTRAVENOUS | Status: AC
  Administered 2016-01-13: 2 g via INTRAVENOUS
  Filled 2016-01-13: qty 50

## 2016-01-13 MED ORDER — APIXABAN 5 MG PO TABS
5.0000 mg | ORAL_TABLET | Freq: Two times a day (BID) | ORAL | Status: DC
  Administered 2016-01-13 – 2016-01-14 (×4): 5 mg via ORAL
  Filled 2016-01-13 (×6): qty 1

## 2016-01-13 MED ORDER — SODIUM CHLORIDE 0.9 % IV BOLUS (SEPSIS): 1000.0000 mL | INTRAVENOUS | Status: AC

## 2016-01-13 MED ORDER — ONDANSETRON HCL 4 MG PO TABS
4.0000 mg | ORAL_TABLET | Freq: Four times a day (QID) | ORAL | Status: DC | PRN
  Administered 2016-01-13: 4 mg via ORAL
  Filled 2016-01-13: qty 1

## 2016-01-13 MED ORDER — OXYCODONE HCL 5 MG PO TABS
5.0000 mg | ORAL_TABLET | ORAL | Status: DC | PRN
Start: 2016-01-13 — End: 2016-01-21
  Administered 2016-01-13 – 2016-01-20 (×9): 5 mg via ORAL
  Filled 2016-01-13 (×10): qty 1

## 2016-01-13 MED ORDER — LOPERAMIDE HCL 2 MG PO CAPS
2.0000 mg | ORAL_CAPSULE | ORAL | Status: DC | PRN
  Administered 2016-01-13 – 2016-01-18 (×4): 2 mg via ORAL
  Filled 2016-01-13 (×5): qty 1

## 2016-01-13 MED ORDER — NICOTINE 21 MG/24HR TD PT24
21.0000 mg | MEDICATED_PATCH | Freq: Every day | TRANSDERMAL | Status: DC
  Administered 2016-01-13 – 2016-01-21 (×9): 21 mg via TRANSDERMAL
  Filled 2016-01-13 (×9): qty 1

## 2016-01-13 MED ORDER — SODIUM CHLORIDE 0.9 % IV SOLN
INTRAVENOUS | Status: DC
  Administered 2016-01-14: 22:00:00 via INTRAVENOUS

## 2016-01-13 MED ORDER — DEXTROSE 5 % IV SOLN
500.0000 mg | INTRAVENOUS | Status: AC
  Administered 2016-01-13 – 2016-01-15 (×3): 500 mg via INTRAVENOUS
  Filled 2016-01-13 (×3): qty 500

## 2016-01-13 MED ORDER — ONDANSETRON HCL 4 MG/2ML IJ SOLN
4.0000 mg | Freq: Four times a day (QID) | INTRAMUSCULAR | Status: DC | PRN
  Administered 2016-01-14 – 2016-01-18 (×3): 4 mg via INTRAVENOUS
  Filled 2016-01-13 (×3): qty 2

## 2016-01-13 NOTE — Assessment & Plan Note (Signed)
Patient reports chronic nausea/vomiting/diarrhea; and feels dehydrated today.  He will receive IV fluid rehydration while at the cancer Center today.  He was also encouraged to push fluids as much as possible.

## 2016-01-13 NOTE — Progress Notes (Signed)
NUTRITION NOTE  Pt screened for MST. RN placing IV at time of first attempted visit. Pt reports he is very HOH and requests that RD wait and talk to his wife when she returns from getting something to eat. Wife was not present at time of second attempted visit.  Will see pt for MST on 2/8 to obtain information from wife at that time.   Antonio Hancock, RD, LDN Inpatient Clinical Dietitian Pager # [phone] After hours/weekend pager # [phone]

## 2016-01-13 NOTE — Progress Notes (Signed)
SYMPTOM MANAGEMENT CLINIC   HPI: Antonio Hancock 75 y.o. male diagnosed with pancreatic cancer.  Currently undergoing Xeloda oral therapy and radiation treatments.   Patient was in the [facility] infusion area.  Receiving IV fluid rehydration; when he acutely developed the shaking chills.  He was checked several times; and was afebrile.  Patient denies any other new complaints whatsoever.  He does confirm that he feels increasingly fatigued and weak.  No labs were obtained today.  Due to the late hour of the day.-Fill it is quite possible that patient will subsequently develop a fever.  Fairly soon.  Also, patient's heart rate has increased from the mid 80s up to the low 100s.  Blood pressure has slightly decreased as well.  Patient will be transported to the emergency department for further evaluation and management.  Differential diagnosis should include pending sepsis.  Brief history.  Report were called to the emergency department charge nurse; prior to the patient being transported to the emergency department via wheelchair.  Per the cancer Center nurse.  Also, Dr. Julien Nordmann, on-call physician for the cancer center is aware of the transported to the emergency department.  HPI  ROS  Past Medical History  Diagnosis Date  . Hypertension   . Diabetes mellitus     Diagnosed Fall 2012  . Mobitz type 2 second degree AV block     11/11/2011  . Tobacco abuse     1/2 ppd x 55 yrs  . Pacemaker 2012    Medtronic Adapta L pulse generator, serial number [id number]  . 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 Hyperlipidemia   . HOH (hard of hearing)   . Anxiety     Past Surgical History  Procedure Laterality Date  . Back surgery    . Permanent pacemaker insertion N/A 11/12/2011    Procedure: PERMANENT PACEMAKER INSERTION;  Surgeon: Deboraha Sprang, MD;  Location: [facility] CATH LAB;  Service: Cardiovascular;  Laterality: Left; Medtronic Adapta L  pulse generator, serial number [id number]    . Hernia repair  1996    left inguinal hernia repair   . Ercp N/A 10/24/2015    Procedure: ENDOSCOPIC RETROGRADE CHOLANGIOPANCREATOGRAPHY (ERCP);  Surgeon: Carol Ada, MD;  Location: [facility] ENDOSCOPY;  Service: Endoscopy;  Laterality: N/A;  . Biliary stent placement N/A 10/24/2015    Procedure: BILIARY STENT PLACEMENT;  Surgeon: Carol Ada, MD;  Location: [facility] ENDOSCOPY;  Service: Endoscopy;  Laterality: N/A;  . Eus N/A 11/07/2015    Procedure: FULL UPPER ENDOSCOPIC ULTRASOUND (EUS) RADIAL;  Surgeon: Carol Ada, MD;  Location: [facility] ENDOSCOPY;  Service: Endoscopy;  Laterality: N/A;  . Fine needle aspiration N/A 11/07/2015    Procedure: FINE NEEDLE ASPIRATION (FNA) LINEAR;  Surgeon: Carol Ada, MD;  Location: [facility] ENDOSCOPY;  Service: Endoscopy;  Laterality: N/A;  . Ercp N/A 11/07/2015    Procedure: ENDOSCOPIC RETROGRADE CHOLANGIOPANCREATOGRAPHY (ERCP);  Surgeon: Carol Ada, MD;  Location: [facility];  Service: Endoscopy;  Laterality: N/A;    has Hypertension; Diabetes mellitus ([place]); Atrioventricular block, complete ([place]); Tobacco abuse; Pacemaker-Medtronic; Atrial fibrillation ([place]); Obstructive sleep apnea; Chronotropic incompetence with sinus node dysfunction ([place]); Renal mass, right; Renal mass; Jaundice; Hyponatremia; Transaminitis; Pancreatic mass; Malignant neoplasm of head of pancreas ([place]); Dehydration; Diarrhea; Hyperbilirubinemia; Hypoalbuminemia due to protein-calorie malnutrition ([place]); SIRS (systemic inflammatory response syndrome) ([place]); Chronic diastolic CHF (congestive heart failure), NYHA class 1 (HCC); CKD (chronic kidney disease) stage 3, GFR 30-59 ml/min; Nausea with vomiting; and Chills  without fever on his problem list.    has No Known Allergies.    Medication List       This list is accurate as of: 01/12/16  6:05 PM.  Always use your most recent med list.               ALPRAZolam 0.25 MG tablet  Commonly known as:  XANAX    Take 0.25 mg by mouth at bedtime.     apixaban 5 MG Tabs tablet  Commonly known as:  ELIQUIS  Take 1 tablet (5 mg total) by mouth 2 (two) times daily.     atorvastatin 10 MG tablet  Commonly known as:  LIPITOR  Take 10 mg by mouth daily.     capecitabine 500 MG tablet  Commonly known as:  XELODA  Take one tablet twice a day Monday to Friday with radiation.     diphenoxylate-atropine 2.5-0.025 MG tablet  Commonly known as:  LOMOTIL  Take 2 tablets by mouth 4 (four) times daily as needed for diarrhea or loose stools.     megestrol 400 MG/10ML suspension  Commonly known as:  MEGACE  Take 10 mLs (400 mg total) by mouth 2 (two) times daily.     metFORMIN 500 MG 24 hr tablet  Commonly known as:  GLUCOPHAGE-XR  Take 500 mg by mouth daily.     morphine 30 MG 12 hr tablet  Commonly known as:  MS CONTIN  Take 1 tablet (30 mg total) by mouth every 12 (twelve) hours.     multivitamins ther. w/minerals Tabs tablet  Take 1 tablet by mouth daily. Reported on 12/26/2015     olmesartan 40 MG tablet  Commonly known as:  BENICAR  Take 40 mg by mouth daily. Reported on 01/12/2016     ondansetron 8 MG tablet  Commonly known as:  ZOFRAN  Take 1 tablet (8 mg total) by mouth every 8 (eight) hours as needed for nausea or vomiting.     oxyCODONE 5 MG immediate release tablet  Commonly known as:  Oxy IR/ROXICODONE  Take 1 tablet (5 mg total) by mouth every 4 (four) hours as needed for moderate pain.     pantoprazole 40 MG tablet  Commonly known as:  PROTONIX  Take 40 mg by mouth daily.     promethazine 12.5 MG tablet  Commonly known as:  PHENERGAN  Take 1 tablet (12.5 mg total) by mouth every 6 (six) hours as needed for nausea or vomiting.         PHYSICAL EXAMINATION  Oncology Vitals 01/13/2016 01/13/2016  Height - -  Weight - -  Weight (lbs) - -  BMI (kg/m2) - -  Temp 98 98.2  Pulse 61 67  Resp 18 18  SpO2 97 97  BSA (m2) - -   BP Readings from Last 2 Encounters:  01/13/16  78/44  01/12/16 117/52    Physical Exam  Constitutional: He is oriented to person, place, and time. He appears malnourished and dehydrated. He appears unhealthy. He appears cachectic. He appears toxic. He has a sickly appearance.  HENT:  Head: Normocephalic and atraumatic.  Eyes: Conjunctivae and EOM are normal. Pupils are equal, round, and reactive to light. Right eye exhibits no discharge. Left eye exhibits no discharge. No scleral icterus.  Neck: Normal range of motion.  Pulmonary/Chest: Effort normal. No respiratory distress.  Musculoskeletal: Normal range of motion.  Neurological: He is alert and oriented to person, place, and time.  Psychiatric: Affect normal.  Nursing note and vitals reviewed.   LABORATORY DATA:. Admission on 01/12/2016  Component Date Value Ref Range Status  . Sodium 01/12/2016 127* 135 - 145 mmol/L Final  . Potassium 01/12/2016 4.5  3.5 - 5.1 mmol/L Final  . Chloride 01/12/2016 102  101 - 111 mmol/L Final  . CO2 01/12/2016 18* 22 - 32 mmol/L Final  . Glucose, Bld 01/12/2016 109* 65 - 99 mg/dL Final  . BUN 01/12/2016 18  6 - 20 mg/dL Final  . Creatinine, Ser 01/12/2016 1.48* 0.61 - 1.24 mg/dL Final  . Calcium 01/12/2016 8.0* 8.9 - 10.3 mg/dL Final  . Total Protein 01/12/2016 6.4* 6.5 - 8.1 g/dL Final  . Albumin 01/12/2016 2.9* 3.5 - 5.0 g/dL Final  . AST 01/12/2016 36  15 - 41 U/L Final  . ALT 01/12/2016 19  17 - 63 U/L Final  . Alkaline Phosphatase 01/12/2016 118  38 - 126 U/L Final  . Total Bilirubin 01/12/2016 1.4* 0.3 - 1.2 mg/dL Final  . GFR calc non Af Amer 01/12/2016 45* >60 mL/min Final  . GFR calc Af Amer 01/12/2016 52* >60 mL/min Final   Comment: (NOTE) The eGFR has been calculated using the CKD EPI equation. This calculation has not been validated in all clinical situations. eGFR's persistently <60 mL/min signify possible Chronic Kidney Disease.   . Anion gap 01/12/2016 7  5 - 15 Final  . WBC 01/12/2016 3.5* 4.0 - 10.5 K/uL Final  . RBC  01/12/2016 3.43* 4.22 - 5.81 MIL/uL Final  . Hemoglobin 01/12/2016 10.6* 13.0 - 17.0 g/dL Final  . HCT 01/12/2016 30.3* 39.0 - 52.0 % Final  . MCV 01/12/2016 88.3  78.0 - 100.0 fL Final  . MCH 01/12/2016 30.9  26.0 - 34.0 pg Final  . MCHC 01/12/2016 35.0  30.0 - 36.0 g/dL Final  . RDW 01/12/2016 16.9* 11.5 - 15.5 % Final  . Platelets 01/12/2016 170  150 - 400 K/uL Final  . Neutrophils Relative % 01/12/2016 86   Final  . Lymphocytes Relative 01/12/2016 9   Final  . Monocytes Relative 01/12/2016 5   Final  . Eosinophils Relative 01/12/2016 0   Final  . Basophils Relative 01/12/2016 0   Final  . Neutro Abs 01/12/2016 3.0  1.7 - 7.7 K/uL Final  . Lymphs Abs 01/12/2016 0.3* 0.7 - 4.0 K/uL Final  . Monocytes Absolute 01/12/2016 0.2  0.1 - 1.0 K/uL Final  . Eosinophils Absolute 01/12/2016 0.0  0.0 - 0.7 K/uL Final  . Basophils Absolute 01/12/2016 0.0  0.0 - 0.1 K/uL Final  . RBC Morphology 01/12/2016 TARGET CELLS   Final  . Smear Review 01/12/2016 LARGE PLATELETS PRESENT   Final  . Color, Urine 01/12/2016 AMBER* YELLOW Final   BIOCHEMICALS MAY BE AFFECTED BY COLOR  . APPearance 01/12/2016 CLOUDY* CLEAR Final  . Specific Gravity, Urine 01/12/2016 1.016  1.005 - 1.030 Final  . pH 01/12/2016 5.0  5.0 - 8.0 Final  . Glucose, UA 01/12/2016 NEGATIVE  NEGATIVE mg/dL Final  . Hgb urine dipstick 01/12/2016 NEGATIVE  NEGATIVE Final  . Bilirubin Urine 01/12/2016 NEGATIVE  NEGATIVE Final  . Ketones, ur 01/12/2016 NEGATIVE  NEGATIVE mg/dL Final  . Protein, ur 01/12/2016 NEGATIVE  NEGATIVE mg/dL Final  . Nitrite 01/12/2016 NEGATIVE  NEGATIVE Final  . Leukocytes, UA 01/12/2016 NEGATIVE  NEGATIVE Final   MICROSCOPIC NOT DONE ON URINES WITH NEGATIVE PROTEIN, BLOOD, LEUKOCYTES, NITRITE, OR GLUCOSE <1000 mg/dL.  . Lactic Acid, Venous 01/12/2016 1.64  0.5 - 2.0 mmol/L Final  .  Glucose-Capillary 01/12/2016 91  65 - 99 mg/dL Final  . Lactic Acid, Venous 01/12/2016 0.72  0.5 - 2.0 mmol/L Final  . Influenza A  By PCR 01/12/2016 NEGATIVE  NEGATIVE Final  . Influenza B By PCR 01/12/2016 NEGATIVE  NEGATIVE Final  . H1N1 flu by pcr 01/12/2016 NOT DETECTED  NOT DETECTED Final   Comment:        The Xpert Flu assay (FDA approved for nasal aspirates or washes and nasopharyngeal swab specimens), is intended as an aid in the diagnosis of influenza and should not be used as a sole basis for treatment. Performed at [facility]   . Specimen Description 01/13/2016 BLOOD LEFT WRIST   Final  . Special Requests 01/13/2016    Final                   Value:BOTTLES DRAWN AEROBIC AND ANAEROBIC 5CC Performed at [facility]   . Culture 01/13/2016 PENDING   Incomplete  . Report Status 01/13/2016 PENDING   Incomplete  . Procalcitonin 01/13/2016 0.24   Final   Comment:        Interpretation: PCT (Procalcitonin) <= 0.5 ng/mL: Systemic infection (sepsis) is not likely. Local bacterial infection is possible. (NOTE)         ICU PCT Algorithm               Non ICU PCT Algorithm    ----------------------------     ------------------------------         PCT < 0.25 ng/mL                 PCT < 0.1 ng/mL     Stopping of antibiotics            Stopping of antibiotics       strongly encouraged.               strongly encouraged.    ----------------------------     ------------------------------       PCT level decrease by               PCT < 0.25 ng/mL       >= 80% from peak PCT       OR PCT 0.25 - 0.5 ng/mL          Stopping of antibiotics                                             encouraged.     Stopping of antibiotics           encouraged.    ----------------------------     ------------------------------       PCT level decrease by              PCT >= 0.25 ng/mL       < 80% from peak PCT        AND PCT >= 0.5 ng/mL            Continuin                          g antibiotics  encouraged.       Continuing antibiotics            encouraged.     ----------------------------     ------------------------------     PCT level increase compared          PCT > 0.5 ng/mL         with peak PCT AND          PCT >= 0.5 ng/mL             Escalation of antibiotics                                          strongly encouraged.      Escalation of antibiotics        strongly encouraged.   . Prothrombin Time 01/13/2016 23.8* 11.6 - 15.2 seconds Final  . INR 01/13/2016 2.14* 0.00 - 1.49 Final  . aPTT 01/13/2016 41* 24 - 37 seconds Final   Comment:        IF BASELINE aPTT IS ELEVATED, SUGGEST PATIENT RISK ASSESSMENT BE USED TO DETERMINE APPROPRIATE ANTICOAGULANT THERAPY.   . Magnesium 01/13/2016 1.3* 1.7 - 2.4 mg/dL Final  . Phosphorus 01/13/2016 2.7  2.5 - 4.6 mg/dL Final  . TSH 01/13/2016 1.470  0.350 - 4.500 uIU/mL Final  . Sodium 01/13/2016 125* 135 - 145 mmol/L Final  . Potassium 01/13/2016 4.0  3.5 - 5.1 mmol/L Final  . Chloride 01/13/2016 102  101 - 111 mmol/L Final  . CO2 01/13/2016 15* 22 - 32 mmol/L Final  . Glucose, Bld 01/13/2016 101* 65 - 99 mg/dL Final  . BUN 01/13/2016 18  6 - 20 mg/dL Final  . Creatinine, Ser 01/13/2016 1.52* 0.61 - 1.24 mg/dL Final  . Calcium 01/13/2016 7.4* 8.9 - 10.3 mg/dL Final  . Total Protein 01/13/2016 5.8* 6.5 - 8.1 g/dL Final  . Albumin 01/13/2016 2.5* 3.5 - 5.0 g/dL Final  . AST 01/13/2016 40  15 - 41 U/L Final  . ALT 01/13/2016 15* 17 - 63 U/L Final  . Alkaline Phosphatase 01/13/2016 101  38 - 126 U/L Final  . Total Bilirubin 01/13/2016 1.2  0.3 - 1.2 mg/dL Final  . GFR calc non Af Amer 01/13/2016 43* >60 mL/min Final  . GFR calc Af Amer 01/13/2016 50* >60 mL/min Final   Comment: (NOTE) The eGFR has been calculated using the CKD EPI equation. This calculation has not been validated in all clinical situations. eGFR's persistently <60 mL/min signify possible Chronic Kidney Disease.   . Anion gap 01/13/2016 8  5 - 15 Final  . WBC 01/13/2016 4.1  4.0 - 10.5 K/uL Final  . RBC 01/13/2016 2.95*  4.22 - 5.81 MIL/uL Final  . Hemoglobin 01/13/2016 9.2* 13.0 - 17.0 g/dL Final  . HCT 01/13/2016 25.8* 39.0 - 52.0 % Final  . MCV 01/13/2016 87.5  78.0 - 100.0 fL Final  . MCH 01/13/2016 31.2  26.0 - 34.0 pg Final  . MCHC 01/13/2016 35.7  30.0 - 36.0 g/dL Final  . RDW 01/13/2016 17.2* 11.5 - 15.5 % Final  . Platelets 01/13/2016 138* 150 - 400 K/uL Final  . C Diff antigen 01/13/2016 NEGATIVE  NEGATIVE Final  . C Diff toxin 01/13/2016 NEGATIVE  NEGATIVE Final  . C Diff interpretation 01/13/2016 Negative for toxigenic C. difficile   Final  . Glucose-Capillary 01/13/2016 89  65 - 99 mg/dL  Final  . Glucose-Capillary 01/13/2016 86  65 - 99 mg/dL Final  . Glucose-Capillary 01/13/2016 81  65 - 99 mg/dL Final  . Glucose-Capillary 01/13/2016 102* 65 - 99 mg/dL Final     RADIOGRAPHIC STUDIES: Ct Abdomen Pelvis Wo Contrast  01/13/2016  CLINICAL DATA:  Acute onset of nausea, vomiting, chills and fever. Diarrhea, status post radiation therapy for pancreatic cancer. Initial encounter. EXAM: CT ABDOMEN AND PELVIS WITHOUT CONTRAST TECHNIQUE: Multidetector CT imaging of the abdomen and pelvis was performed following the standard protocol without IV contrast. COMPARISON:  CT of the abdomen and pelvis from 11/14/2015 FINDINGS: Trace bilateral pleural effusions are noted, with bibasilar airspace opacity, which may reflect atelectasis or pneumonia. Pacemaker leads are partially imaged. Since the prior study, there is increased moderate volume ascites within the abdomen and pelvis. A biliary duct stent is noted in unchanged position, and is partially filled with air, with a large amount of pneumobilia noted within the liver. The spleen is mildly bulky but remains normal in size. Trace air is noted within the gallbladder. The gallbladder is difficult to fully assess given ascites. The known pancreatic head mass is difficult to fully assess without contrast. The adrenal glands are grossly unremarkable. A 3.5 cm hyperdense  mass along the lateral aspect of the right kidney is mildly increased in size and may reflect a complex renal cyst or malignancy. Nonspecific perinephric stranding is noted bilaterally. There is no evidence of hydronephrosis. No renal or ureteral stones are seen. Scattered vascular calcifications are seen at both renal hila. The small bowel is grossly unremarkable in appearance. The stomach is within normal limits. No acute vascular abnormalities are seen. 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 Diffuse calcification is noted along the abdominal aorta and its branches. The appendix remains normal in caliber and contains contrast, without evidence of appendicitis. Apparent diffuse wall thickening and mucosal edema is noted along the ascending colon, which may reflect mild infectious or inflammatory colitis. Scattered diverticulosis is noted along the descending and sigmoid colon, without evidence of diverticulitis. The bladder is mildly distended and grossly unremarkable. The prostate is enlarged, measuring 5.4 cm in transverse dimension. No inguinal lymphadenopathy is seen. No acute osseous abnormalities are identified. Thoracolumbar spinal fusion hardware is noted. There is chronic compression deformity involving vertebral body T12, with underlying chronic osseous fusion. IMPRESSION: 1. Increasing moderate volume ascites within the abdomen and pelvis. 2. Apparent diffuse wall thickening and mucosal edema along the ascending colon, which may reflect mild infectious or inflammatory colitis, depending on the patient's symptoms. Alternatively, this may be related to the patient's ascites. 3. Trace bilateral pleural effusions, with bibasilar airspace opacity, which may reflect atelectasis or pneumonia. 4. Biliary duct stent is noted in unchanged position, with a large amount of pneumobilia noted within the liver. 5. Known pancreatic head mass difficult to fully  assess without contrast. 6. 3.5 cm hyperdense mass along the lateral aspect of the right kidney has mildly increased in size and may reflect a complex renal cyst for malignancy, as previously described. 7. Diffuse calcification along the abdominal aorta and its branches. 8. 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 Diffuse calcification along the abdominal aorta and its branches. Recommend followup by ultrasound in 3 years. This recommendation follows ACR consensus guidelines: White Paper of the ACR Incidental Findings Committee II on Vascular Findings. J Am Coll Radiol 2013; 10:789-794. 9. Scattered diverticulosis along the  descending and sigmoid colon, without evidence of diverticulitis. 10. Enlarged prostate noted. Electronically Signed   By: Garald Balding M.D.   On: 01/13/2016 03:18   Dg Chest 2 View  01/12/2016  CLINICAL DATA:  75 year old male with sepsis EXAM: CHEST  2 VIEW COMPARISON:  Chest CT dated 11/25/2015 FINDINGS: Two views of the chest do not demonstrate a focal consolidation. There is no pleural effusion or pneumothorax. Bibasilar atelectatic changes most prominent on the right. The cardiac silhouette is within normal limits. Left pectoral pacemaker device. There is osteopenia with degenerative changes of the spine. Thoracolumbar fixation hardware noted. No acute osseous pathology. IMPRESSION: No active cardiopulmonary disease. Electronically Signed   By: Anner Crete M.D.   On: 01/12/2016 18:58    ASSESSMENT/PLAN:    Nausea with vomiting Patient reports chronic nausea and intermittent vomiting.  He states that his antinausea medications he already has at home are ineffective.  He feels dehydrated today; and received IV fluid rehydration while at the cancer Center today.  Malignant neoplasm of head of pancreas [facility]) Patient continues to take Xeloda oral therapy as directed; and also continues with daily radiation treatments.  His final  radiation treatment is scheduled for 01/19/2016.  Patient is scheduled to return on 01/15/2016 for labs and a follow-up visit.  Diarrhea Patient reports chronic diarrhea as well.  Patient has been taking Imodium and Lomotil on an alternating basis with only minimal effectiveness.  He feels dehydrated today; will receive IV fluid rehydration while at the cancer center.  Dehydration Patient reports chronic nausea/vomiting/diarrhea; and feels dehydrated today.  He will receive IV fluid rehydration while at the cancer Center today.  He was also encouraged to push fluids as much as possible.  Chills without fever Patient was in the [place] infusion area.  Receiving IV fluid rehydration; when he acutely developed the shaking chills.  He was checked several times; and was afebrile.  Patient denies any other new complaints whatsoever.  He does confirm that he feels increasingly fatigued and weak.  No labs were obtained today.  Due to the late hour of the day.-Fill it is quite possible that patient will subsequently develop a fever.  Fairly soon.  Also, patient's heart rate has increased from the mid 80s up to the low 100s.  Blood pressure has slightly decreased as well.  Patient will be transported to the emergency department for further evaluation and management.  Differential diagnosis should include pending sepsis.  Brief history.  Report were called to the emergency department charge nurse; prior to the patient being transported to the emergency department via wheelchair.  Per the cancer Center nurse.  Also, Dr. Julien Nordmann, on-call physician for the cancer center is aware of the transported to the emergency department.  Patient stated understanding of all instructions; and was in agreement with this plan of care. The patient knows to call the clinic with any problems, questions or concerns.   Review/collaboration with Dr. Julien Nordmann, on-call regarding all aspects of patient's visit today.    Total time spent with patient was 40 minutes;  with greater than 75 percent of that time spent in face to face counseling regarding patient's symptoms,  and coordination of care and follow up.  Disclaimer:This dictation was prepared with Dragon/digital dictation along with [employer]. Any transcriptional errors that result from this process are unintentional.  Drue Second, NP 01/13/2016

## 2016-01-13 NOTE — ED Notes (Signed)
Blood cultures not gathered before intitiation of antibiotics. Dr. Roel Cluck made aware.

## 2016-01-13 NOTE — Progress Notes (Addendum)
Antonio Hancock [id number] DOB: 06/18/1941 DOA: 01/12/2016 PCP: Antonio Seller, MD  Assessment at time of admission by Dr Antonio Hancock on 01/12/2016 Chief Complaint: Chills HPI: Antonio Hancock is a 75 y.o. male  has a past medical history of Hypertension; Diabetes mellitus; Mobitz type 2 second degree AV block; Tobacco abuse; Pacemaker (2012); Chronotropic incompetence with sinus node dysfunction ([place]) (2012); Hyperlipidemia; HOH (hard of hearing); and Anxiety. Presented with chills while undergoing fluids infusion at cancer center. Patient has been dealing with diarrhea side effects of radiation therapy for his pancreatic cancer and has required repeated visits to oncology Center for fluids. Begin controlling his diarrhea with Imodium and was given a prescription for Lomotil. He's been having some nausea and vomiting. Started to have chills tonight his vital signs were checked while at [place] he was noted to be tachycardic and hypotensive while being on his second liter of normal saline at the clinic. Deneis any significant cough, no chest pain, no sick contacts IN ER: Patient developed fever while in emergency department up to 102.5 heart rate as high as 103 respirations up to 24. Blood pressure going systolics going down to mid 80s lactic acid unremarkable 2. UA and chest x-ray both did not show any evidence of infection. Influenza PCR was ordered Regarding pertinent past history: Patient has history of locally advanced pancreatic cancer diagnosed in November 2016 currently undergoing weekly radiation therapy status post ERCP and stent placement for biliary obstruction. currently on Xeloda. Patient has history of atrial fibrillation anticoagulated with Eliquis. He sp pacemaker due to symptomatic bradycardia. Patient used to have history of hypertension but had to discontinue all his blood pressure medications in the past secondary to recurrent hypotension in a setting of dehydration. Also had history  of diabetes but given massive weight loss no longer needs any medications Hospitalist was called for admission for persistent nausea vomiting diarrhea, resulting in dehydration with evidence of SIRS Assessment/Plan 75 year-old gentleman history of pancreatic cancer currently on by mouth chemotherapy and radiation therapy presents with worsening nausea vomiting diarrhea fever with evidence of SIRS Present on Admission:  . SIRS (systemic inflammatory response syndrome) (HCC) etiology unclear given persistent nausea vomiting or diarrhea develops a CT scan of abdomen. Obtain stool studies. Check for influenza. For now will broadly cover vancomycin and Zosyn and await results of the blood cultures  . Malignant neoplasm of head of pancreas [facility]) - oncology consult in the morning for right now hold Xeloda  . Pacemaker-Medtronic -stable currently appears to be stable rhythm  . Hypertension hold home blood pressure medications given soft blood pressures  . Dehydration and minister IV fluids and check orthostatics tomorrow . Atrial fibrillation ([place]) continue anticoagulation currently appears to be rate controlled rhythm  . Chronic diastolic CHF (congestive heart failure), NYHA class 1 (HCC) stable appears to be dehydrated we will administer IV fluids but watch for fluid overload  . CKD (chronic kidney disease) stage 3, GFR 30-59 ml/min creatinine is currently at baseline continue to monitor . Tobacco abuse recommended cessation order nicotine patch Prophylaxis: Eliquis  Summary&Daily Progress Notes since admission 01/13/16: I have seen and examined Antonio Hancock at bedside in the presence of his wife and reviewed his chart. He was admitted with sepsis likely related to pneumonia in setting of immunocompromise. He continues to be hypotensive despite antibiotics and IV fluids. He has ascites on CT abdomen and pelvis but is not tender in the abdomen therefore I am not convinced of the possibility of peritonitis.  Question  will be whether he has carcinomatosis. Will defer further workup to oncology as patient currently anticoagulated. Meanwhile, will continue vancomycin/Zosyn and add Zithromax pending septic workup results. Of note is that Influenza swab negative.CT abdomen/pelvis showed "1. Increasing moderate volume ascites within the abdomen and pelvis. 2. Apparent diffuse wall thickening and mucosal edema along the ascending colon, which may reflect mild infectious or inflammatory colitis, depending on the patient's symptoms. Alternatively, this may be related to the patient's ascites. 3. Trace bilateral pleural effusions, with bibasilar airspace opacity, which may reflect atelectasis or pneumonia. 4. Biliary duct stent is noted in unchanged position, with a large amount of pneumobilia noted within the liver. 5. Known pancreatic head mass difficult to fully assess without contrast. 6. 3.5 cm hyperdense mass along the lateral aspect of the right kidney has mildly increased in size and may reflect a complex renal cyst for malignancy, as previously described. 7. Diffuse calcification along the abdominal aorta and its branches. 8. 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 Diffuse calcification along the abdominal aorta and its branches. Recommend followup by ultrasound in 3 years. This recommendation follows ACR consensus guidelines: White Paper of the ACR Incidental Findings Committee II on Vascular Findings. J Am Coll Radiol 2013; 10:789-794. 9. Scattered diverticulosis along the descending and sigmoid colon, without evidence of diverticulitis. 10. Enlarged prostate noted". Question of colitis but patient denies recent antibiotic usage, and C. difficile PCR negative. Would continue Vanc/Zosyn and Zithromax pending septic workup results and defer to oncology for further studies. Problem List Plan  Principal Problem:   Severe sepsis ([place]) Active Problems:   Hypertension    Diabetes mellitus ([place])   Tobacco abuse   Pacemaker-Medtronic   Atrial fibrillation (HCC)   Obstructive sleep apnea   Hyponatremia   Malignant neoplasm of head of pancreas (HCC)   Dehydration   Chronic diastolic CHF (congestive heart failure), NYHA class 1 (HCC)   CKD (chronic kidney disease) stage 3, GFR 30-59 ml/min   Hypomagnesemia   Ascites   Pneumonia   Colitis   Follow septic work up  Day 2 Vanc/Zosyn  Zithromax  IVF  Follow oncology regarding cancer management -hold off paracentesis for now  Continue Eliquis  Code Status: DNR Family Communication: Wife at bedside Disposition Plan:  Eventually home Consultants:  Consider Oncology/GI Procedures:   Antibiotics:  Vancomycin 01/12/16>>  Zosyn 01/12/16>>  HPI/Subjective: "So, so", feels better compared to yesterday.  Objective: Filed Vitals:   01/13/16 1555 01/13/16 2010  BP: 91/50 93/46  Pulse: 62 72  Temp: 98.3 F (36.8 C) 98.2 F (36.8 C)  Resp: 18 20    Intake/Output Summary (Last 24 hours) at 01/13/16 2218 Last data filed at 01/13/16 [id number]  Gross per 24 hour  Intake    240 ml  Output      0 ml  Net    240 ml   Filed Weights   01/12/16 2257  Weight: 66.679 kg (147 lb)    Exam:   General:  Comfortable at rest. Coughing.  Cardiovascular: S1-S2 normal. No murmurs. Pulse regular.  Respiratory: Good air entry bilaterally. No rhonchi or rales.  Abdomen: Soft and nontender. Normal bowel sounds. No organomegaly. Abdomen uniformly distended.  Musculoskeletal: No pedal edema   Neurological: Intact  Data Reviewed: Basic Metabolic Panel:  Recent Labs Lab 01/08/16 1139 01/12/16 1824 01/13/16 0155  NA 127* 127* 125*  K 4.5 4.5 4.0  CL  --  102 102  CO2 17*  18* 15*  GLUCOSE 90 109* 101*  BUN 20.1 18 18   CREATININE 1.7* 1.48* 1.52*  CALCIUM 8.6 8.0* 7.4*  MG  --   --  1.3*  PHOS  --   --  2.7   Liver Function Tests:  Recent Labs Lab 01/08/16 1139 01/12/16 1824 01/13/16 0155   AST 28 36 40  ALT 16 19 15*  ALKPHOS 141 118 101  BILITOT 1.51* 1.4* 1.2  PROT 6.8 6.4* 5.8*  ALBUMIN 2.8* 2.9* 2.5*   No results for input(s): LIPASE, AMYLASE in the last 168 hours. No results for input(s): AMMONIA in the last 168 hours. CBC:  Recent Labs Lab 01/08/16 1139 01/12/16 1824 01/13/16 0155  WBC 2.6* 3.5* 4.1  NEUTROABS 1.9 3.0  --   HGB 11.1* 10.6* 9.2*  HCT 32.8* 30.3* 25.8*  MCV 91.2 88.3 87.5  PLT 187 170 138*   Cardiac Enzymes: No results for input(s): CKTOTAL, CKMB, CKMBINDEX, TROPONINI in the last 168 hours. BNP (last 3 results) No results for input(s): BNP in the last 8760 hours.  ProBNP (last 3 results) No results for input(s): PROBNP in the last 8760 hours.  CBG:  Recent Labs Lab 01/13/16 0540 01/13/16 0747 01/13/16 1219 01/13/16 1615 01/13/16 2013  GLUCAP 86 81 102* 78 105*    Recent Results (from the past 240 hour(s))  Blood Culture (routine x 2)     Status: None (Preliminary result)   Collection Time: 01/13/16 12:16 AM  Result Value Ref Range Status   Specimen Description BLOOD LEFT WRIST  Final   Special Requests   Final    BOTTLES DRAWN AEROBIC AND ANAEROBIC 5CC Performed at [facility]    Culture PENDING  Incomplete   Report Status PENDING  Incomplete  Gastrointestinal Panel by PCR , Stool     Status: None   Collection Time: 01/13/16  1:23 AM  Result Value Ref Range Status   Campylobacter species NOT DETECTED NOT DETECTED Final   Plesimonas shigelloides NOT DETECTED NOT DETECTED Final   Salmonella species NOT DETECTED NOT DETECTED Final   Yersinia enterocolitica NOT DETECTED NOT DETECTED Final   Vibrio species NOT DETECTED NOT DETECTED Final   Vibrio cholerae NOT DETECTED NOT DETECTED Final   Enteroaggregative E coli (EAEC) NOT DETECTED NOT DETECTED Final   Enteropathogenic E coli (EPEC) NOT DETECTED NOT DETECTED Final   Enterotoxigenic E coli (ETEC) NOT DETECTED NOT DETECTED Final   Shiga like toxin producing E  coli (STEC) NOT DETECTED NOT DETECTED Final   E. coli O157 NOT DETECTED NOT DETECTED Final   Shigella/Enteroinvasive E coli (EIEC) NOT DETECTED NOT DETECTED Final   Cryptosporidium NOT DETECTED NOT DETECTED Final   Cyclospora cayetanensis NOT DETECTED NOT DETECTED Final   Entamoeba histolytica NOT DETECTED NOT DETECTED Final   Giardia lamblia NOT DETECTED NOT DETECTED Final   Adenovirus F40/41 NOT DETECTED NOT DETECTED Final   Astrovirus NOT DETECTED NOT DETECTED Final   Norovirus GI/GII NOT DETECTED NOT DETECTED Final   Rotavirus A NOT DETECTED NOT DETECTED Final   Sapovirus (I, II, IV, and V) NOT DETECTED NOT DETECTED Final  C difficile quick scan w PCR reflex     Status: None   Collection Time: 01/13/16  1:23 AM  Result Value Ref Range Status   C Diff antigen NEGATIVE NEGATIVE Final   C Diff toxin NEGATIVE NEGATIVE Final   C Diff interpretation Negative for toxigenic C. difficile  Final     Studies: Ct Abdomen Pelvis Wo  Contrast  01/13/2016  CLINICAL DATA:  Acute onset of nausea, vomiting, chills and fever. Diarrhea, status post radiation therapy for pancreatic cancer. Initial encounter. EXAM: CT ABDOMEN AND PELVIS WITHOUT CONTRAST TECHNIQUE: Multidetector CT imaging of the abdomen and pelvis was performed following the standard protocol without IV contrast. COMPARISON:  CT of the abdomen and pelvis from 11/14/2015 FINDINGS: Trace bilateral pleural effusions are noted, with bibasilar airspace opacity, which may reflect atelectasis or pneumonia. Pacemaker leads are partially imaged. Since the prior study, there is increased moderate volume ascites within the abdomen and pelvis. A biliary duct stent is noted in unchanged position, and is partially filled with air, with a large amount of pneumobilia noted within the liver. The spleen is mildly bulky but remains normal in size. Trace air is noted within the gallbladder. The gallbladder is difficult to fully assess given ascites. The known  pancreatic head mass is difficult to fully assess without contrast. The adrenal glands are grossly unremarkable. A 3.5 cm hyperdense mass along the lateral aspect of the right kidney is mildly increased in size and may reflect a complex renal cyst or malignancy. Nonspecific perinephric stranding is noted bilaterally. There is no evidence of hydronephrosis. No renal or ureteral stones are seen. Scattered vascular calcifications are seen at both renal hila. The small bowel is grossly unremarkable in appearance. The stomach is within normal limits. No acute vascular abnormalities are seen. 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 Diffuse calcification is noted along the abdominal aorta and its branches. The appendix remains normal in caliber and contains contrast, without evidence of appendicitis. Apparent diffuse wall thickening and mucosal edema is noted along the ascending colon, which may reflect mild infectious or inflammatory colitis. Scattered diverticulosis is noted along the descending and sigmoid colon, without evidence of diverticulitis. The bladder is mildly distended and grossly unremarkable. The prostate is enlarged, measuring 5.4 cm in transverse dimension. No inguinal lymphadenopathy is seen. No acute osseous abnormalities are identified. Thoracolumbar spinal fusion hardware is noted. There is chronic compression deformity involving vertebral body T12, with underlying chronic osseous fusion. IMPRESSION: 1. Increasing moderate volume ascites within the abdomen and pelvis. 2. Apparent diffuse wall thickening and mucosal edema along the ascending colon, which may reflect mild infectious or inflammatory colitis, depending on the patient's symptoms. Alternatively, this may be related to the patient's ascites. 3. Trace bilateral pleural effusions, with bibasilar airspace opacity, which may reflect atelectasis or pneumonia. 4. Biliary duct stent is noted  in unchanged position, with a large amount of pneumobilia noted within the liver. 5. Known pancreatic head mass difficult to fully assess without contrast. 6. 3.5 cm hyperdense mass along the lateral aspect of the right kidney has mildly increased in size and may reflect a complex renal cyst for malignancy, as previously described. 7. Diffuse calcification along the abdominal aorta and its branches. 8. 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 Diffuse calcification along the abdominal aorta and its branches. Recommend followup by ultrasound in 3 years. This recommendation follows ACR consensus guidelines: White Paper of the ACR Incidental Findings Committee II on Vascular Findings. J Am Coll Radiol 2013; 10:789-794. 9. Scattered diverticulosis along the descending and sigmoid colon, without evidence of diverticulitis. 10. Enlarged prostate noted. Electronically Signed   By: Garald Balding M.D.   On: 01/13/2016 03:18   Dg Chest 2 View  01/12/2016  CLINICAL DATA:  75 year old male with sepsis EXAM:  CHEST  2 VIEW COMPARISON:  Chest CT dated 11/25/2015 FINDINGS: Two views of the chest do not demonstrate a focal consolidation. There is no pleural effusion or pneumothorax. Bibasilar atelectatic changes most prominent on the right. The cardiac silhouette is within normal limits. Left pectoral pacemaker device. There is osteopenia with degenerative changes of the spine. Thoracolumbar fixation hardware noted. No acute osseous pathology. IMPRESSION: No active cardiopulmonary disease. Electronically Signed   By: Anner Crete M.D.   On: 01/12/2016 18:58    Scheduled Meds: . ALPRAZolam  0.25 mg Oral QHS  . apixaban  5 mg Oral BID  . atorvastatin  10 mg Oral Daily  . azithromycin  500 mg Intravenous Q24H  . feeding supplement  1 Container Oral TID BM  . insulin aspart  0-9 Units Subcutaneous 6 times per day  . morphine  30 mg Oral q morning - 10a  . nicotine  21 mg  Transdermal Daily  . pantoprazole  40 mg Oral Q1200  . piperacillin-tazobactam (ZOSYN)  IV  3.375 g Intravenous 3 times per day  . vancomycin  500 mg Intravenous Q12H   Continuous Infusions: . sodium chloride 100 mL/hr at 01/13/16 [place] [facility] Pager [phone]. If 7PM-7AM, please contact night-coverage at [email or web address], password [email or web address] 01/13/2016, 10:18 PM

## 2016-01-13 NOTE — Assessment & Plan Note (Signed)
Patient reports chronic diarrhea as well.  Patient has been taking Imodium and Lomotil on an alternating basis with only minimal effectiveness.  He feels dehydrated today; will receive IV fluid rehydration while at the cancer center.

## 2016-01-13 NOTE — Assessment & Plan Note (Addendum)
Patient continues to take Xeloda oral therapy as directed; and also continues with daily radiation treatments.  His final radiation treatment is scheduled for 01/19/2016.  Patient is scheduled to return on 01/15/2016 for labs and a follow-up visit.

## 2016-01-13 NOTE — Telephone Encounter (Signed)
I called and spoke with the patient's wife regarding the patient's hospitalization. He is currently being worked up for a possible sepsis. He spiked a temp of 102.5 and was tachycardic in the ED, CT reveals diffuse thickening of the bowel possibly consistent with colitis versus result of large volume ascites. He was not treated today in XRT, and I will discuss with Dr. Lisbeth Renshaw regarding treatment tomorrow. We will continue to follow along.

## 2016-01-13 NOTE — Assessment & Plan Note (Signed)
Patient was in the [place] infusion area.  Receiving IV fluid rehydration; when he acutely developed the shaking chills.  He was checked several times; and was afebrile.  Patient denies any other new complaints whatsoever.  He does confirm that he feels increasingly fatigued and weak.  No labs were obtained today.  Due to the late hour of the day.-Fill it is quite possible that patient will subsequently develop a fever.  Fairly soon.  Also, patient's heart rate has increased from the mid 80s up to the low 100s.  Blood pressure has slightly decreased as well.  Patient will be transported to the emergency department for further evaluation and management.  Differential diagnosis should include pending sepsis.  Brief history.  Report were called to the emergency department charge nurse; prior to the patient being transported to the emergency department via wheelchair.  Per the cancer Center nurse.  Also, Dr. Julien Nordmann, on-call physician for the cancer center is aware of the transported to the emergency department.

## 2016-01-13 NOTE — Assessment & Plan Note (Signed)
Patient reports chronic nausea and intermittent vomiting.  He states that his antinausea medications he already has at home are ineffective.  He feels dehydrated today; and received IV fluid rehydration while at the cancer Center today.

## 2016-01-14 ENCOUNTER — Ambulatory Visit: Payer: BLUE CROSS/BLUE SHIELD

## 2016-01-14 ENCOUNTER — Ambulatory Visit
Admission: RE | Admit: 2016-01-14 | Discharge: 2016-01-14 | Disposition: A | Discharge status: Home / Self Care | Payer: BLUE CROSS/BLUE SHIELD | Source: Ambulatory Visit | Attending: Radiation Oncology | Admitting: Radiation Oncology

## 2016-01-14 DIAGNOSIS — N183 Chronic kidney disease, stage 3 (moderate): Secondary | ICD-10-CM | POA: Diagnosis present

## 2016-01-14 DIAGNOSIS — J189 Pneumonia, unspecified organism: Secondary | ICD-10-CM | POA: Diagnosis present

## 2016-01-14 DIAGNOSIS — K520 Gastroenteritis and colitis due to radiation: Secondary | ICD-10-CM | POA: Diagnosis present

## 2016-01-14 DIAGNOSIS — R6520 Severe sepsis without septic shock: Secondary | ICD-10-CM | POA: Diagnosis not present

## 2016-01-14 DIAGNOSIS — Z8042 Family history of malignant neoplasm of prostate: Secondary | ICD-10-CM | POA: Diagnosis not present

## 2016-01-14 DIAGNOSIS — F17210 Nicotine dependence, cigarettes, uncomplicated: Secondary | ICD-10-CM | POA: Diagnosis present

## 2016-01-14 DIAGNOSIS — G4733 Obstructive sleep apnea (adult) (pediatric): Secondary | ICD-10-CM | POA: Diagnosis present

## 2016-01-14 DIAGNOSIS — E43 Unspecified severe protein-calorie malnutrition: Secondary | ICD-10-CM | POA: Diagnosis present

## 2016-01-14 DIAGNOSIS — I482 Chronic atrial fibrillation: Secondary | ICD-10-CM | POA: Diagnosis not present

## 2016-01-14 DIAGNOSIS — C259 Malignant neoplasm of pancreas, unspecified: Secondary | ICD-10-CM | POA: Diagnosis not present

## 2016-01-14 DIAGNOSIS — R6883 Chills (without fever): Secondary | ICD-10-CM | POA: Diagnosis present

## 2016-01-14 DIAGNOSIS — E785 Hyperlipidemia, unspecified: Secondary | ICD-10-CM | POA: Diagnosis present

## 2016-01-14 DIAGNOSIS — C250 Malignant neoplasm of head of pancreas: Secondary | ICD-10-CM | POA: Diagnosis present

## 2016-01-14 DIAGNOSIS — E876 Hypokalemia: Secondary | ICD-10-CM | POA: Diagnosis present

## 2016-01-14 DIAGNOSIS — R188 Other ascites: Secondary | ICD-10-CM | POA: Diagnosis present

## 2016-01-14 DIAGNOSIS — E871 Hypo-osmolality and hyponatremia: Secondary | ICD-10-CM | POA: Diagnosis present

## 2016-01-14 DIAGNOSIS — Z66 Do not resuscitate: Secondary | ICD-10-CM | POA: Diagnosis present

## 2016-01-14 DIAGNOSIS — N3000 Acute cystitis without hematuria: Secondary | ICD-10-CM | POA: Diagnosis present

## 2016-01-14 DIAGNOSIS — Z9221 Personal history of antineoplastic chemotherapy: Secondary | ICD-10-CM | POA: Diagnosis not present

## 2016-01-14 DIAGNOSIS — Z808 Family history of malignant neoplasm of other organs or systems: Secondary | ICD-10-CM | POA: Diagnosis not present

## 2016-01-14 DIAGNOSIS — I4510 Unspecified right bundle-branch block: Secondary | ICD-10-CM | POA: Diagnosis present

## 2016-01-14 DIAGNOSIS — Z79899 Other long term (current) drug therapy: Secondary | ICD-10-CM | POA: Diagnosis not present

## 2016-01-14 DIAGNOSIS — E860 Dehydration: Secondary | ICD-10-CM | POA: Diagnosis present

## 2016-01-14 DIAGNOSIS — Z9689 Presence of other specified functional implants: Secondary | ICD-10-CM | POA: Diagnosis present

## 2016-01-14 DIAGNOSIS — Z950 Presence of cardiac pacemaker: Secondary | ICD-10-CM | POA: Diagnosis not present

## 2016-01-14 DIAGNOSIS — Z6820 Body mass index (BMI) 20.0-20.9, adult: Secondary | ICD-10-CM | POA: Diagnosis not present

## 2016-01-14 DIAGNOSIS — Z7901 Long term (current) use of anticoagulants: Secondary | ICD-10-CM | POA: Diagnosis not present

## 2016-01-14 DIAGNOSIS — I441 Atrioventricular block, second degree: Secondary | ICD-10-CM | POA: Diagnosis present

## 2016-01-14 DIAGNOSIS — Z7984 Long term (current) use of oral hypoglycemic drugs: Secondary | ICD-10-CM | POA: Diagnosis not present

## 2016-01-14 DIAGNOSIS — R627 Adult failure to thrive: Secondary | ICD-10-CM | POA: Diagnosis present

## 2016-01-14 DIAGNOSIS — E1122 Type 2 diabetes mellitus with diabetic chronic kidney disease: Secondary | ICD-10-CM | POA: Diagnosis present

## 2016-01-14 DIAGNOSIS — R197 Diarrhea, unspecified: Secondary | ICD-10-CM | POA: Diagnosis not present

## 2016-01-14 DIAGNOSIS — I959 Hypotension, unspecified: Secondary | ICD-10-CM | POA: Diagnosis not present

## 2016-01-14 DIAGNOSIS — A419 Sepsis, unspecified organism: Secondary | ICD-10-CM | POA: Diagnosis not present

## 2016-01-14 DIAGNOSIS — T451X5A Adverse effect of antineoplastic and immunosuppressive drugs, initial encounter: Secondary | ICD-10-CM | POA: Diagnosis present

## 2016-01-14 DIAGNOSIS — I4891 Unspecified atrial fibrillation: Secondary | ICD-10-CM | POA: Diagnosis present

## 2016-01-14 DIAGNOSIS — I130 Hypertensive heart and chronic kidney disease with heart failure and stage 1 through stage 4 chronic kidney disease, or unspecified chronic kidney disease: Secondary | ICD-10-CM | POA: Diagnosis present

## 2016-01-14 DIAGNOSIS — Z833 Family history of diabetes mellitus: Secondary | ICD-10-CM | POA: Diagnosis not present

## 2016-01-14 DIAGNOSIS — K529 Noninfective gastroenteritis and colitis, unspecified: Secondary | ICD-10-CM | POA: Diagnosis present

## 2016-01-14 DIAGNOSIS — A409 Streptococcal sepsis, unspecified: Secondary | ICD-10-CM | POA: Diagnosis present

## 2016-01-14 DIAGNOSIS — I5032 Chronic diastolic (congestive) heart failure: Secondary | ICD-10-CM | POA: Diagnosis present

## 2016-01-14 DIAGNOSIS — D61810 Antineoplastic chemotherapy induced pancytopenia: Secondary | ICD-10-CM | POA: Diagnosis present

## 2016-01-14 DIAGNOSIS — Z79891 Long term (current) use of opiate analgesic: Secondary | ICD-10-CM | POA: Diagnosis not present

## 2016-01-14 DIAGNOSIS — B961 Klebsiella pneumoniae [K. pneumoniae] as the cause of diseases classified elsewhere: Secondary | ICD-10-CM | POA: Diagnosis present

## 2016-01-14 DIAGNOSIS — R251 Tremor, unspecified: Secondary | ICD-10-CM | POA: Diagnosis present

## 2016-01-14 DIAGNOSIS — F419 Anxiety disorder, unspecified: Secondary | ICD-10-CM | POA: Diagnosis present

## 2016-01-14 DIAGNOSIS — H9190 Unspecified hearing loss, unspecified ear: Secondary | ICD-10-CM | POA: Diagnosis present

## 2016-01-14 DIAGNOSIS — Z8249 Family history of ischemic heart disease and other diseases of the circulatory system: Secondary | ICD-10-CM | POA: Diagnosis not present

## 2016-01-14 DIAGNOSIS — Z510 Encounter for antineoplastic radiation therapy: Secondary | ICD-10-CM | POA: Diagnosis not present

## 2016-01-14 LAB — COMPREHENSIVE METABOLIC PANEL
ALBUMIN: 2 g/dL — AB (ref 3.5–5.0)
ALT: 13 U/L — ABNORMAL LOW (ref 17–63)
ANION GAP: 7 (ref 5–15)
AST: 28 U/L (ref 15–41)
Alkaline Phosphatase: 81 U/L (ref 38–126)
BILIRUBIN TOTAL: 1 mg/dL (ref 0.3–1.2)
BUN: 15 mg/dL (ref 6–20)
CO2: 17 mmol/L — ABNORMAL LOW (ref 22–32)
Calcium: 7.6 mg/dL — ABNORMAL LOW (ref 8.9–10.3)
Chloride: 106 mmol/L (ref 101–111)
Creatinine, Ser: 1.38 mg/dL — ABNORMAL HIGH (ref 0.61–1.24)
GFR calc Af Amer: 57 mL/min — ABNORMAL LOW (ref >60)
GFR, EST NON AFRICAN AMERICAN: 49 mL/min — AB (ref >60)
Glucose, Bld: 84 mg/dL (ref 65–99)
POTASSIUM: 3.2 mmol/L — AB (ref 3.5–5.1)
Sodium: 130 mmol/L — ABNORMAL LOW (ref 135–145)
TOTAL PROTEIN: 4.8 g/dL — AB (ref 6.5–8.1)

## 2016-01-14 LAB — CBC WITH DIFFERENTIAL/PLATELET
BASOS PCT: 0 %
Basophils Absolute: 0 10*3/uL (ref 0.0–0.1)
EOS PCT: 5 %
Eosinophils Absolute: 0.1 10*3/uL (ref 0.0–0.7)
HEMATOCRIT: 23.5 % — AB (ref 39.0–52.0)
Hemoglobin: 8.3 g/dL — ABNORMAL LOW (ref 13.0–17.0)
Lymphocytes Relative: 8 %
Lymphs Abs: 0.1 10*3/uL — ABNORMAL LOW (ref 0.7–4.0)
MCH: 31.1 pg (ref 26.0–34.0)
MCHC: 35.3 g/dL (ref 30.0–36.0)
MCV: 88 fL (ref 78.0–100.0)
MONOS PCT: 13 %
Monocytes Absolute: 0.2 10*3/uL (ref 0.1–1.0)
NEUTROS ABS: 1.2 10*3/uL — AB (ref 1.7–7.7)
Neutrophils Relative %: 74 %
PLATELETS: 109 10*3/uL — AB (ref 150–400)
RBC: 2.67 MIL/uL — ABNORMAL LOW (ref 4.22–5.81)
RDW: 17.2 % — AB (ref 11.5–15.5)
WBC: 1.6 10*3/uL — ABNORMAL LOW (ref 4.0–10.5)

## 2016-01-14 LAB — GLUCOSE, CAPILLARY
GLUCOSE-CAPILLARY: 84 mg/dL (ref 65–99)
GLUCOSE-CAPILLARY: 75 mg/dL (ref 65–99)
GLUCOSE-CAPILLARY: 103 mg/dL — AB (ref 65–99)
Glucose-Capillary: 105 mg/dL — ABNORMAL HIGH (ref 65–99)
Glucose-Capillary: 133 mg/dL — ABNORMAL HIGH (ref 65–99)
Glucose-Capillary: 96 mg/dL (ref 65–99)
Glucose-Capillary: 97 mg/dL (ref 65–99)

## 2016-01-14 LAB — HEMOGLOBIN A1C
HEMOGLOBIN A1C: 5.7 % — AB (ref 4.8–5.6)
Mean Plasma Glucose: 117 mg/dL

## 2016-01-14 LAB — MAGNESIUM: MAGNESIUM: 1.7 mg/dL (ref 1.7–2.4)

## 2016-01-14 MED ORDER — LOPERAMIDE HCL 2 MG PO CAPS
4.0000 mg | ORAL_CAPSULE | Freq: Once | ORAL | Status: AC
  Administered 2016-01-14: 4 mg via ORAL
  Filled 2016-01-14: qty 2

## 2016-01-14 MED ORDER — PROCHLORPERAZINE EDISYLATE 5 MG/ML IJ SOLN
10.0000 mg | Freq: Four times a day (QID) | INTRAMUSCULAR | Status: DC | PRN
  Administered 2016-01-14: 10 mg via INTRAVENOUS
  Filled 2016-01-14 (×2): qty 2

## 2016-01-14 MED ORDER — PNEUMOCOCCAL VAC POLYVALENT 25 MCG/0.5ML IJ INJ: 0.5000 mL | INJECTION | INTRAMUSCULAR | Status: DC | PRN

## 2016-01-14 MED ORDER — PANCRELIPASE (LIP-PROT-AMYL) 12000-38000 UNITS PO CPEP
24000.0000 [IU] | ORAL_CAPSULE | Freq: Three times a day (TID) | ORAL | Status: DC
  Administered 2016-01-14 – 2016-01-18 (×10): 24000 [IU] via ORAL
  Filled 2016-01-14 (×14): qty 2

## 2016-01-14 MED ORDER — POTASSIUM CHLORIDE CRYS ER 20 MEQ PO TBCR
40.0000 meq | EXTENDED_RELEASE_TABLET | ORAL | Status: AC
  Administered 2016-01-14 (×2): 40 meq via ORAL
  Filled 2016-01-14 (×2): qty 2

## 2016-01-14 MED ORDER — MAGNESIUM SULFATE 50 % IJ SOLN
3.0000 g | Freq: Once | INTRAVENOUS | Status: AC
  Administered 2016-01-14: 3 g via INTRAVENOUS
  Filled 2016-01-14: qty 6

## 2016-01-14 NOTE — Progress Notes (Signed)
Initial Nutrition Assessment  DOCUMENTATION CODES:   Severe malnutrition in context of acute illness/injury  INTERVENTION:  - Continue Boost Breeze po TID, each supplement provides 250 kcal and 9 grams of protein - Continue CLD and advance as medically feasible - RD will continue to monitor for needs  NUTRITION DIAGNOSIS:   Inadequate oral intake related to acute illness, nausea, vomiting as evidenced by per patient/family report.  GOAL:   Patient will meet greater than or equal to 90% of their needs  MONITOR:   PO intake, Supplement acceptance, Weight trends, Labs, Skin, I & O's  REASON FOR ASSESSMENT:   Malnutrition Screening Tool  ASSESSMENT:   Presented with chills while undergoing fluids infusion at cancer center. Patient has been dealing with diarrhea side effects of radiation therapy for his pancreatic cancer and has required repeated visits to oncology Center for fluids. Begin controlling his diarrhea with Imodium and was given a prescription for Lomotil. He's been having some nausea and vomiting. Started to have chills tonight his vital signs were checked while at [place] he was noted to be tachycardic and hypotensive while being on his second liter of normal saline at the clinic. Deneis any significant cough, no chest pain, no sick contacts  Pt seen for MST. BMI indicates normal weight. Pt noted to be HOH per chart. Yesterday he requested that RD return when wife was present due to [facility]. Wife at bedside this AM. Per chart review, pt consumed 75% of CLD breakfast yesterday. Wife reports that this AM pt had jello and chicken broth and shortly after had several episodes of diarrhea. She states that for the past 1 month he has had nausea with associated vomiting and/or diarrhea with nearly all PO intakes. Pt has tried a variety of foods and drinks but has N/D with nearly all intakes. Wife states that these episodes began around the same time that pt began oral chemo. She  states that he also has complained of taste alteration; pt states this is a very "yucky" taste but he is unable to give further detail on this.   Pt requesting a baked potato. Informed him of current diet order and that diet advancement will be per MD discretion. Pt and wife verbalize understanding. Pt states that he has tolerated a baked potato with butter recently without N/D and that he also tolerated crackers with peanut butter recently although he has also had N/D with this item on other occassions. Pt with many missing teeth and needs softer foods related to this.   Unable to perform physical assessment at this time. Pt reports that since June 2016 he has lost 45-50 lbs. Per chart review, he has lost 7 lbs (5% body weight) in the past 1 month which is significant for time frame.   Not meeting needs currently. Medications reviewed. Labs reviewed; Na: 130 mmol/L, K: 3.2 mmol/L, creatinine elevated, Ca: 7.6 mg/dL, GFR: 49.   Diet Order:  Diet clear liquid Room service appropriate?: No; Fluid consistency:: Thin  Skin:  Reviewed, no issues  Last BM:  2/7  Height:   Ht Readings from Last 1 Encounters:  01/12/16 5\' 11"  (1.803 m)    Weight:   Wt Readings from Last 1 Encounters:  01/12/16 147 lb (66.679 kg)    Ideal Body Weight:  78.18 kg (kg)  BMI:  Body mass index is 20.51 kg/(m^2).  Estimated Nutritional Needs:   Kcal:  2000-2200 (30-33 kcal/kg)  Protein:  90-100 grams  Fluid:  >/= 2 L/day  EDUCATION NEEDS:   No education needs identified at this time     Jarome Matin, [place], [facility] Inpatient Clinical Dietitian Pager # [phone] After hours/weekend pager # [phone]

## 2016-01-14 NOTE — Progress Notes (Signed)
S:The patient was seen in the morning and has been followed closely on RN since his admission. He is currently being treated for probable sepsis, with broad spectrum antibiotics, and the source is still unknown at this point in time. ANC 1200 today, and he's been afebrile the last 24 hours. The patient continues to have significant diarrhea and states that he has had a lot of gas as well. He denies any nausea or vomiting. He states that he has had some soreness and abdominal fullness that is been going on for several days.  O:  Vitals reviewed in chart In general this is a chronically ill-appearing Caucasian male in no acute chest. He is alert and oriented 4 and appropriate throughout the examination. Cardiovascular exam reveals a regular rate and rhythm, no clicks, rubs or murmurs are auscultated chest is clear to auscultation bilaterally. No rales or rhonchi are noted. The abdomen is slightly distended with hyperactive bowel sounds in the upper quadrant normoactive bowel sounds in the lower quadrants. There is questionable fluid wave, the abdomen is otherwise soft and nontender.  A/P: Patient with advanced pancreatic cancer undergoing Xeloda chemotherapy and concurrent radiotherapy. The patient has 5 more fractions until he completes his radiation therapy. We will defer his Xeloda administration to Dr. Alen Blew in medical oncology. His ANC is appropriate for him to receive radiation therapy today which we have recommended. We will bring him to our department for treatment later this afternoon. We will continue to follow the patient along with you, and appreciate your care of him.  Carola Rhine, PAC

## 2016-01-14 NOTE — Progress Notes (Signed)
[place] [facility] Dept Therapy Treatment Record Phone [phone]   Radiation Therapy was administered to Antonio Hancock on: 01/14/2016  2:40 PM and was treatment # 24 out of a planned course of 25 treatments.

## 2016-01-14 NOTE — Care Management Note (Signed)
Case Management Note  Patient Details  Name: Antonio Hancock MRN: [id number] Date of Birth: [birth year]  Subjective/Objective:74 y/o m admitted w/diarrhea. From home.                    Action/Plan:d/c plan home.   Expected Discharge Date:                  Expected Discharge Plan:  Home/Self Care  In-House Referral:     Discharge planning Services  CM Consult  Post Acute Care Choice:    Choice offered to:     DME Arranged:    DME Agency:     HH Arranged:    HH Agency:     Status of Service:  In process, will continue to follow  Medicare Important Message Given:    Date Medicare IM Given:    Medicare IM give by:    Date Additional Medicare IM Given:    Additional Medicare Important Message give by:     If discussed at [place] of Stay Meetings, dates discussed:    Additional Comments:  Dessa Phi, RN 01/14/2016, 12:04 PM

## 2016-01-14 NOTE — Progress Notes (Signed)
[facility] PROGRESS NOTE  Antonio Hancock [id number] DOB: September 18, 1941 DOA: 01/12/2016 PCP: Woody Seller, MD  Brief interval history Antonio Hancock is a 75 y.o. male has a past medical history of Hypertension; Diabetes mellitus; Mobitz type 2 second degree AV block; Tobacco abuse; Pacemaker (2012); Chronotropic incompetence with sinus node dysfunction ([place]) (2012); Hyperlipidemia; HOH (hard of hearing); and Anxiety. Presented with chills while undergoing fluids infusion at cancer center. Patient has been dealing with diarrhea side effects of radiation therapy for his pancreatic cancer and has required repeated visits to oncology Center for fluids. Begin controlling his diarrhea with Imodium and was given a prescription for Lomotil. He's been having some nausea and vomiting. Started to have chills tonight his vital signs were checked while at [place] he was noted to be tachycardic and hypotensive while being on his second liter of normal saline at the clinic. Deneis any significant cough, no chest pain, no sick contacts IN ER: Patient developed fever while in emergency department up to 102.5 heart rate as high as 103 respirations up to 24. Blood pressure going systolics going down to mid 80s lactic acid unremarkable 2. UA and chest x-ray both did not show any evidence of infection. Influenza PCR was ordered Regarding pertinent past history: Patient has history of locally advanced pancreatic cancer diagnosed in November 2016 currently undergoing weekly radiation therapy status post ERCP and stent placement for biliary obstruction. currently on Xeloda. Patient has history of atrial fibrillation anticoagulated with Eliquis. He sp pacemaker due to symptomatic bradycardia. Patient used to have history of hypertension but had to discontinue all his blood pressure medications in the past secondary to recurrent hypotension in a setting of dehydration. Also had history of diabetes but given  massive weight loss no longer needs any medications Hospitalist was called for admission for persistent nausea vomiting diarrhea, resulting in dehydration with evidence of SIRS  Assessment/Plan: #1 sepsis Patient presenting with chills, fever with a temperature 102.5, tachycardia, tachypnea and hypotension with systolic blood pressure in the 80s. Patient with complaints of worsening multiple loose stools. Chest x-ray done was negative for any acute infiltrate. Patient has been pancultured results are pending. C. difficile PCR is negative. Stool GI pathogen panel is negative. Patient currently afebrile. Some clinical improvement. Continue empiric IV vancomycin and IV Zosyn, IV Azithromycin. Follow.  #2 diarrhea Questionable etiology. 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 Continue Imodium as needed. We'll place on pancreatic enzyme/creon continue supportive care. Follow.   #3 pancreatic cancer Patient received radiation treatment today. Xeloda on hold. Will likely need outpatient follow-up.  #4  dehydration IV fluids.  #5 chronic diastolic CHF Patient appears dehydrated. Patient on IV fluids. Monitor closely for volume overload.  #6 ascites Patient with noted ascites on exam and on CT scan. Will get her ultrasound paracenteses. Will discontinue patient's anticoagulation until paracentesis is done. Continue empiric IV antibiotics. Follow.  #7 hypertension Hold antihypertensive medications as patient blood pressure is borderline.  #8 status post permanent pacemaker  #9 atrial fibrillation Currently rate controlled. Will hold on tired quite relation in anticipation of paracentesis.  #10 prophylaxis PPI for GI prophylaxis. SCDs for DVT prophylaxis.   Code Status: DO NOT RESUSCITATE Family Communication: Updated patient, wife, sister at bedside. Disposition Plan: Home when medically stable with  improvement in diarrhea and inability to tolerate oral intake and sepsis workup is complete.   Consultants:  None  Procedures:  CT abdomen and pelvis 01/13/2016  chest x-ray 01/12/2016  Antibiotics:  IV azithromycin 01/13/2016  IV Zosyn 01/12/2016  IV vancomycin 01/12/2016  HPI/Subjective: Patient denies any chest pain. No shortness of breath. Patient still complaining of diarrhea which he states is worse than his chronic diarrhea feels everything he eats passes through him.  Objective: Filed Vitals:   01/14/16 0527 01/14/16 1420  BP: 104/55 103/51  Pulse: 62 68  Temp: 98.3 F (36.8 C) 97.8 F (36.6 C)  Resp: 20 20    Intake/Output Summary (Last 24 hours) at 01/14/16 1501 Last data filed at 01/14/16 1100  Gross per 24 hour  Intake    480 ml  Output      0 ml  Net    480 ml   Filed Weights   01/12/16 2257  Weight: 66.679 kg (147 lb)    Exam:   General:  NAD. CACHETIC  Cardiovascular: RRR  Respiratory: Clear to auscultation bilaterally.  Abdomen: Soft,  distended, somewhat tight, nontender to palpation, positive bowel sounds  Musculoskeletal: No clubbing cyanosis or edema.  Data Reviewed: Basic Metabolic Panel:  Recent Labs Lab 01/08/16 1139 01/12/16 1824 01/13/16 0155 01/14/16 0631  NA 127* 127* 125* 130*  K 4.5 4.5 4.0 3.2*  CL  --  102 102 106  CO2 17* 18* 15* 17*  GLUCOSE 90 109* 101* 84  BUN 20.1 18 18 15   CREATININE 1.7* 1.48* 1.52* 1.38*  CALCIUM 8.6 8.0* 7.4* 7.6*  MG  --   --  1.3* 1.7  PHOS  --   --  2.7  --    Liver Function Tests:  Recent Labs Lab 01/08/16 1139 01/12/16 1824 01/13/16 0155 01/14/16 0631  AST 28 36 40 28  ALT 16 19 15* 13*  ALKPHOS 141 118 101 81  BILITOT 1.51* 1.4* 1.2 1.0  PROT 6.8 6.4* 5.8* 4.8*  ALBUMIN 2.8* 2.9* 2.5* 2.0*   No results for input(s): LIPASE, AMYLASE in the last 168 hours. No results for input(s): AMMONIA in the last 168 hours. CBC:  Recent Labs Lab 01/08/16 1139  01/12/16 1824 01/13/16 0155 01/14/16 0631  WBC 2.6* 3.5* 4.1 1.6*  NEUTROABS 1.9 3.0  --  1.2*  HGB 11.1* 10.6* 9.2* 8.3*  HCT 32.8* 30.3* 25.8* 23.5*  MCV 91.2 88.3 87.5 88.0  PLT 187 170 138* 109*   Cardiac Enzymes: No results for input(s): CKTOTAL, CKMB, CKMBINDEX, TROPONINI in the last 168 hours. BNP (last 3 results) No results for input(s): BNP in the last 8760 hours.  ProBNP (last 3 results) No results for input(s): PROBNP in the last 8760 hours.  CBG:  Recent Labs Lab 01/13/16 2013 01/13/16 2358 01/14/16 0303 01/14/16 0736 01/14/16 1148  GLUCAP 105* 97 84 75 96    Recent Results (from the past 240 hour(s))  Urine culture     Status: None (Preliminary result)   Collection Time: 01/12/16  8:31 PM  Result Value Ref Range Status   Specimen Description URINE, CLEAN CATCH  Final   Special Requests NONE  Final   Culture   Final    50,000 COLONIES/mL GRAM NEGATIVE RODS Performed at [facility]    Report Status PENDING  Incomplete  Blood Culture (routine x 2)     Status: None (Preliminary result)   Collection Time: 01/13/16 12:16 AM  Result Value Ref Range Status   Specimen Description BLOOD LEFT ANTECUBITAL  Final   Special Requests IN PEDIATRIC BOTTLE [place]  Final   Culture   Final    NO  GROWTH 1 DAY Performed at [facility]    Report Status PENDING  Incomplete  Blood Culture (routine x 2)     Status: None (Preliminary result)   Collection Time: 01/13/16 12:16 AM  Result Value Ref Range Status   Specimen Description BLOOD LEFT WRIST  Final   Special Requests BOTTLES DRAWN AEROBIC AND ANAEROBIC 5CC  Final   Culture   Final    NO GROWTH 1 DAY Performed at [facility]    Report Status PENDING  Incomplete  Gastrointestinal Panel by PCR , Stool     Status: None   Collection Time: 01/13/16  1:23 AM  Result Value Ref Range Status   Campylobacter species NOT DETECTED NOT DETECTED Final   Plesimonas shigelloides NOT DETECTED NOT DETECTED  Final   Salmonella species NOT DETECTED NOT DETECTED Final   Yersinia enterocolitica NOT DETECTED NOT DETECTED Final   Vibrio species NOT DETECTED NOT DETECTED Final   Vibrio cholerae NOT DETECTED NOT DETECTED Final   Enteroaggregative E coli (EAEC) NOT DETECTED NOT DETECTED Final   Enteropathogenic E coli (EPEC) NOT DETECTED NOT DETECTED Final   Enterotoxigenic E coli (ETEC) NOT DETECTED NOT DETECTED Final   Shiga like toxin producing E coli (STEC) NOT DETECTED NOT DETECTED Final   E. coli O157 NOT DETECTED NOT DETECTED Final   Shigella/Enteroinvasive E coli (EIEC) NOT DETECTED NOT DETECTED Final   Cryptosporidium NOT DETECTED NOT DETECTED Final   Cyclospora cayetanensis NOT DETECTED NOT DETECTED Final   Entamoeba histolytica NOT DETECTED NOT DETECTED Final   Giardia lamblia NOT DETECTED NOT DETECTED Final   Adenovirus F40/41 NOT DETECTED NOT DETECTED Final   Astrovirus NOT DETECTED NOT DETECTED Final   Norovirus GI/GII NOT DETECTED NOT DETECTED Final   Rotavirus A NOT DETECTED NOT DETECTED Final   Sapovirus (I, II, IV, and V) NOT DETECTED NOT DETECTED Final  C difficile quick scan w PCR reflex     Status: None   Collection Time: 01/13/16  1:23 AM  Result Value Ref Range Status   C Diff antigen NEGATIVE NEGATIVE Final   C Diff toxin NEGATIVE NEGATIVE Final   C Diff interpretation Negative for toxigenic C. difficile  Final     Studies: Ct Abdomen Pelvis Wo Contrast  01/13/2016  CLINICAL DATA:  Acute onset of nausea, vomiting, chills and fever. Diarrhea, status post radiation therapy for pancreatic cancer. Initial encounter. EXAM: CT ABDOMEN AND PELVIS WITHOUT CONTRAST TECHNIQUE: Multidetector CT imaging of the abdomen and pelvis was performed following the standard protocol without IV contrast. COMPARISON:  CT of the abdomen and pelvis from 11/14/2015 FINDINGS: Trace bilateral pleural effusions are noted, with bibasilar airspace opacity, which may reflect atelectasis or pneumonia.  Pacemaker leads are partially imaged. Since the prior study, there is increased moderate volume ascites within the abdomen and pelvis. A biliary duct stent is noted in unchanged position, and is partially filled with air, with a large amount of pneumobilia noted within the liver. The spleen is mildly bulky but remains normal in size. Trace air is noted within the gallbladder. The gallbladder is difficult to fully assess given ascites. The known pancreatic head mass is difficult to fully assess without contrast. The adrenal glands are grossly unremarkable. A 3.5 cm hyperdense mass along the lateral aspect of the right kidney is mildly increased in size and may reflect a complex renal cyst or malignancy. Nonspecific perinephric stranding is noted bilaterally. There is no evidence of hydronephrosis. No renal or ureteral stones are  seen. Scattered vascular calcifications are seen at both renal hila. The small bowel is grossly unremarkable in appearance. The stomach is within normal limits. No acute vascular abnormalities are seen. 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 Diffuse calcification is noted along the abdominal aorta and its branches. The appendix remains normal in caliber and contains contrast, without evidence of appendicitis. Apparent diffuse wall thickening and mucosal edema is noted along the ascending colon, which may reflect mild infectious or inflammatory colitis. Scattered diverticulosis is noted along the descending and sigmoid colon, without evidence of diverticulitis. The bladder is mildly distended and grossly unremarkable. The prostate is enlarged, measuring 5.4 cm in transverse dimension. No inguinal lymphadenopathy is seen. No acute osseous abnormalities are identified. Thoracolumbar spinal fusion hardware is noted. There is chronic compression deformity involving vertebral body T12, with underlying chronic osseous fusion. IMPRESSION: 1.  Increasing moderate volume ascites within the abdomen and pelvis. 2. Apparent diffuse wall thickening and mucosal edema along the ascending colon, which may reflect mild infectious or inflammatory colitis, depending on the patient's symptoms. Alternatively, this may be related to the patient's ascites. 3. Trace bilateral pleural effusions, with bibasilar airspace opacity, which may reflect atelectasis or pneumonia. 4. Biliary duct stent is noted in unchanged position, with a large amount of pneumobilia noted within the liver. 5. Known pancreatic head mass difficult to fully assess without contrast. 6. 3.5 cm hyperdense mass along the lateral aspect of the right kidney has mildly increased in size and may reflect a complex renal cyst for malignancy, as previously described. 7. Diffuse calcification along the abdominal aorta and its branches. 8. 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 Diffuse calcification along the abdominal aorta and its branches. Recommend followup by ultrasound in 3 years. This recommendation follows ACR consensus guidelines: White Paper of the ACR Incidental Findings Committee II on Vascular Findings. J Am Coll Radiol 2013; 10:789-794. 9. Scattered diverticulosis along the descending and sigmoid colon, without evidence of diverticulitis. 10. Enlarged prostate noted. Electronically Signed   By: Garald Balding M.D.   On: 01/13/2016 03:18   Dg Chest 2 View  01/12/2016  CLINICAL DATA:  75 year old male with sepsis EXAM: CHEST  2 VIEW COMPARISON:  Chest CT dated 11/25/2015 FINDINGS: Two views of the chest do not demonstrate a focal consolidation. There is no pleural effusion or pneumothorax. Bibasilar atelectatic changes most prominent on the right. The cardiac silhouette is within normal limits. Left pectoral pacemaker device. There is osteopenia with degenerative changes of the spine. Thoracolumbar fixation hardware noted. No acute osseous pathology.  IMPRESSION: No active cardiopulmonary disease. Electronically Signed   By: Anner Crete M.D.   On: 01/12/2016 18:58    Scheduled Meds: . ALPRAZolam  0.25 mg Oral QHS  . atorvastatin  10 mg Oral Daily  . azithromycin  500 mg Intravenous Q24H  . feeding supplement  1 Container Oral TID BM  . insulin aspart  0-9 Units Subcutaneous 6 times per day  . lipase/protease/amylase  24,000 Units Oral TID WC  . morphine  30 mg Oral q morning - 10a  . nicotine  21 mg Transdermal Daily  . pantoprazole  40 mg Oral Q1200  . piperacillin-tazobactam (ZOSYN)  IV  3.375 g Intravenous 3 times per day  . vancomycin  500 mg Intravenous Q12H   Continuous Infusions: . sodium chloride 100 mL/hr at 01/13/16 1617    Principal Problem:  Severe sepsis (HCC) Active Problems:   Hypertension   Diabetes mellitus (HCC)   Tobacco abuse   Pacemaker-[employer]   Atrial fibrillation (HCC)   Obstructive sleep apnea   Hyponatremia   Malignant neoplasm of head of pancreas (HCC)   Dehydration   Chronic diastolic CHF (congestive heart failure), NYHA class 1 (HCC)   CKD (chronic kidney disease) stage 3, GFR 30-59 ml/min   Hypomagnesemia   Ascites   Pneumonia   Colitis   Protein-calorie malnutrition, severe    Time spent: 52 minutes    Haydin Calandra M.D. [facility] Pager [phone]. If 7PM-7AM, please contact night-coverage at [email or web address], password [facility] 01/14/2016, 3:01 PM  LOS: 0 days

## 2016-01-15 ENCOUNTER — Other Ambulatory Visit: Payer: BLUE CROSS/BLUE SHIELD

## 2016-01-15 ENCOUNTER — Inpatient Hospital Stay (HOSPITAL_COMMUNITY): Payer: BLUE CROSS/BLUE SHIELD

## 2016-01-15 ENCOUNTER — Telehealth: Payer: Self-pay | Admitting: *Deleted

## 2016-01-15 ENCOUNTER — Ambulatory Visit: Payer: BLUE CROSS/BLUE SHIELD

## 2016-01-15 ENCOUNTER — Ambulatory Visit
Admit: 2016-01-15 | Discharge: 2016-01-15 | Disposition: A | Discharge status: Home / Self Care | Payer: BLUE CROSS/BLUE SHIELD | Attending: Radiation Oncology | Admitting: Radiation Oncology

## 2016-01-15 ENCOUNTER — Ambulatory Visit: Payer: BLUE CROSS/BLUE SHIELD | Admitting: Oncology

## 2016-01-15 DIAGNOSIS — N3000 Acute cystitis without hematuria: Secondary | ICD-10-CM

## 2016-01-15 DIAGNOSIS — D61810 Antineoplastic chemotherapy induced pancytopenia: Secondary | ICD-10-CM

## 2016-01-15 DIAGNOSIS — J189 Pneumonia, unspecified organism: Secondary | ICD-10-CM

## 2016-01-15 DIAGNOSIS — T451X5A Adverse effect of antineoplastic and immunosuppressive drugs, initial encounter: Secondary | ICD-10-CM

## 2016-01-15 DIAGNOSIS — Z510 Encounter for antineoplastic radiation therapy: Secondary | ICD-10-CM | POA: Diagnosis not present

## 2016-01-15 DIAGNOSIS — E871 Hypo-osmolality and hyponatremia: Secondary | ICD-10-CM

## 2016-01-15 LAB — URINE CULTURE: Culture: 50000

## 2016-01-15 LAB — BODY FLUID CELL COUNT WITH DIFFERENTIAL
EOS FL: 0 %
Lymphs, Fluid: 7 %
Monocyte-Macrophage-Serous Fluid: 89 % (ref 50–90)
Neutrophil Count, Fluid: 4 % (ref 0–25)
Total Nucleated Cell Count, Fluid: 62 cu mm (ref 0–1000)

## 2016-01-15 LAB — PROTEIN, BODY FLUID

## 2016-01-15 LAB — VANCOMYCIN, TROUGH: Vancomycin Tr: 15 ug/mL (ref 10.0–20.0)

## 2016-01-15 LAB — COMPREHENSIVE METABOLIC PANEL
ALK PHOS: 75 U/L (ref 38–126)
ALT: 14 U/L — AB (ref 17–63)
AST: 24 U/L (ref 15–41)
Albumin: 1.9 g/dL — ABNORMAL LOW (ref 3.5–5.0)
Anion gap: 5 (ref 5–15)
BUN: 11 mg/dL (ref 6–20)
CALCIUM: 7.5 mg/dL — AB (ref 8.9–10.3)
CHLORIDE: 111 mmol/L (ref 101–111)
CO2: 16 mmol/L — AB (ref 22–32)
CREATININE: 1.32 mg/dL — AB (ref 0.61–1.24)
GFR, EST AFRICAN AMERICAN: 60 mL/min — AB (ref >60)
GFR, EST NON AFRICAN AMERICAN: 51 mL/min — AB (ref >60)
Glucose, Bld: 86 mg/dL (ref 65–99)
Potassium: 3.4 mmol/L — ABNORMAL LOW (ref 3.5–5.1)
Sodium: 132 mmol/L — ABNORMAL LOW (ref 135–145)
Total Bilirubin: 1.1 mg/dL (ref 0.3–1.2)
Total Protein: 4.6 g/dL — ABNORMAL LOW (ref 6.5–8.1)

## 2016-01-15 LAB — GLUCOSE, CAPILLARY
Glucose-Capillary: 76 mg/dL (ref 65–99)
Glucose-Capillary: 71 mg/dL (ref 65–99)
Glucose-Capillary: 81 mg/dL (ref 65–99)
Glucose-Capillary: 83 mg/dL (ref 65–99)
Glucose-Capillary: 72 mg/dL (ref 65–99)
Glucose-Capillary: 100 mg/dL — ABNORMAL HIGH (ref 65–99)

## 2016-01-15 LAB — CBC
HCT: 22.8 % — ABNORMAL LOW (ref 39.0–52.0)
Hemoglobin: 8 g/dL — ABNORMAL LOW (ref 13.0–17.0)
MCH: 31 pg (ref 26.0–34.0)
MCHC: 35.1 g/dL (ref 30.0–36.0)
MCV: 88.4 fL (ref 78.0–100.0)
PLATELETS: 116 10*3/uL — AB (ref 150–400)
RBC: 2.58 MIL/uL — ABNORMAL LOW (ref 4.22–5.81)
RDW: 17.2 % — AB (ref 11.5–15.5)
WBC: 1.2 10*3/uL — AB (ref 4.0–10.5)

## 2016-01-15 LAB — DIFFERENTIAL
BASOS ABS: 0 10*3/uL (ref 0.0–0.1)
BASOS PCT: 1 %
EOS PCT: 5 %
Eosinophils Absolute: 0.1 10*3/uL (ref 0.0–0.7)
Lymphocytes Relative: 10 %
Lymphs Abs: 0.1 10*3/uL — ABNORMAL LOW (ref 0.7–4.0)
MONOS PCT: 14 %
Monocytes Absolute: 0.2 10*3/uL (ref 0.1–1.0)
Neutro Abs: 0.8 10*3/uL — ABNORMAL LOW (ref 1.7–7.7)
Neutrophils Relative %: 70 %

## 2016-01-15 LAB — GRAM STAIN

## 2016-01-15 LAB — MAGNESIUM: MAGNESIUM: 2 mg/dL (ref 1.7–2.4)

## 2016-01-15 LAB — GLUCOSE, SEROUS FLUID: Glucose, Fluid: 94 mg/dL

## 2016-01-15 LAB — LACTATE DEHYDROGENASE, PLEURAL OR PERITONEAL FLUID: LD FL: 32 U/L — AB (ref 3–23)

## 2016-01-15 MED ORDER — TBO-FILGRASTIM 300 MCG/0.5ML ~~LOC~~ SOSY
300.0000 ug | PREFILLED_SYRINGE | Freq: Every day | SUBCUTANEOUS | Status: DC
  Administered 2016-01-15: 300 ug via SUBCUTANEOUS
  Filled 2016-01-15 (×3): qty 0.5

## 2016-01-15 MED ORDER — ARFORMOTEROL TARTRATE 15 MCG/2ML IN NEBU
15.0000 ug | INHALATION_SOLUTION | Freq: Two times a day (BID) | RESPIRATORY_TRACT | Status: DC
  Administered 2016-01-15 – 2016-01-20 (×7): 15 ug via RESPIRATORY_TRACT
  Filled 2016-01-15 (×14): qty 2

## 2016-01-15 MED ORDER — BUDESONIDE 0.25 MG/2ML IN SUSP
0.2500 mg | Freq: Two times a day (BID) | RESPIRATORY_TRACT | Status: DC
  Administered 2016-01-15 – 2016-01-20 (×7): 0.25 mg via RESPIRATORY_TRACT
  Filled 2016-01-15 (×11): qty 2

## 2016-01-15 MED ORDER — DEXTROSE-NACL 5-0.9 % IV SOLN
INTRAVENOUS | Status: DC
  Administered 2016-01-15 – 2016-01-17 (×2): via INTRAVENOUS

## 2016-01-15 MED ORDER — TIOTROPIUM BROMIDE MONOHYDRATE 18 MCG IN CAPS
18.0000 ug | ORAL_CAPSULE | Freq: Every day | RESPIRATORY_TRACT | Status: DC
  Administered 2016-01-16 – 2016-01-20 (×4): 18 ug via RESPIRATORY_TRACT
  Filled 2016-01-15: qty 5

## 2016-01-15 MED ORDER — LIP MEDEX EX OINT
TOPICAL_OINTMENT | CUTANEOUS | Status: AC
  Administered 2016-01-15: 09:00:00
  Filled 2016-01-15: qty 7

## 2016-01-15 NOTE — Procedures (Signed)
Ultrasound-guided diagnostic and therapeutic paracentesis performed yielding 3.7 liters of clear yellow colored fluid. No immediate complications.  Antonio Hancock E 2:21 PM 01/15/2016

## 2016-01-15 NOTE — Telephone Encounter (Signed)
Called the floor room 1508 asked RN Sherlynn Stalls   Why the differential was cancelled   That was to be added to this mornings cbc,  And That was ordered by our PA Shona Simpson,  We need that to see if patient can be treated ? RN didn't know that the lab was cancelled and will check on that and call us back, he is still scheduled for  Paracentesis today but not known  When 11:28 AM

## 2016-01-15 NOTE — Progress Notes (Signed)
OT Cancellation Note  Patient Details Name: Antonio Hancock MRN: [id number] DOB: 28-Aug-1941   Cancelled Treatment:    Reason Eval/Treat Not Completed: Medical issues which prohibited therapy.  Noted pt has critical wbc of 1.2. Will postpone OT evaluation today.  Atziri Zubiate 01/15/2016, 8:36 AM  Lesle Chris, OTR/L [phone] 01/15/2016

## 2016-01-15 NOTE — Progress Notes (Addendum)
Date: January 15, 2016 Chart reviewed for concurrent status and case management needs. Will continue to follow patient for changes and needs: diverticulosis, mutiple area of infection in the large colon , abd ascites-paracentesis [id number] Velva Harman, BSN, [place], [place]   [phone]

## 2016-01-15 NOTE — Progress Notes (Signed)
CRITICAL VALUE ALERT  Critical value received:  Wbc 1.2  Date of notification:  2/9  Time of notification:  0635  Critical value read back:Yes.    Nurse who received alert:  Wayland Denis  MD notified (1st page):  schorr  Time of first page:  0645  MD notified (2nd page):  Time of second page:  Responding MD:    Time MD responded:

## 2016-01-15 NOTE — Progress Notes (Signed)
[facility] Radiation Oncology Dept Therapy Treatment Record Phone [phone]   Radiation Therapy was administered to Antonio Hancock on: 01/15/2016  12:12 PM and was treatment # 25 out of a planned course of 28 treatments.

## 2016-01-15 NOTE — Progress Notes (Signed)
Pharmacy Antibiotic Note  Antonio Hancock is a 75 y.o. male admitted on 01/12/2016 with sepsis.  Pharmacy has been consulted for Zosyn/Vancomycin dosing.  Plan: vanc trough=15 (goal 15-20 ) continue vanc 500mg  IV q12h Continue Zosyn 3.375g IV Q8H infused over 4hrs. F/u clinical course and renal function  Height: 5\' 11"  (180.3 cm) Weight: 147 lb (66.679 kg) IBW/kg (Calculated) : 75.3  Temp (24hrs), Avg:97.8 F (36.6 C), Min:97.6 F (36.4 C), Max:98 F (36.7 C)   Recent Labs Lab 01/08/16 1139 01/12/16 1824 01/12/16 1841 01/12/16 2127 01/13/16 0155 01/14/16 0631 01/15/16 0547 01/15/16 0917  WBC 2.6* 3.5*  --   --  4.1 1.6* 1.2*  --   CREATININE 1.7* 1.48*  --   --  1.52* 1.38* 1.32*  --   LATICACIDVEN  --   --  1.64 0.72  --   --   --   --   VANCOTROUGH  --   --   --   --   --   --   --  15    Estimated Creatinine Clearance: 46.3 mL/min (by C-G formula based on Cr of 1.32).    No Known Allergies  Antimicrobials this admission: 2/6 zosyn >>  2/6 vancomycin >>   Dose adjustments this admission: 2/9 vanc trough @ 0900=15  Microbiology results:  2/7 BCx: NGTD  2/6 UCx: klebsiella pneumoniae, sensitive to zosyn  2/7 CDiff: neg Sputum:   MRSA PCR:   Thank you for allowing pharmacy to be a part of this patient's care.  Dolly Rias RPh 01/15/2016, 10:42 AM Pager [phone]

## 2016-01-15 NOTE — Progress Notes (Signed)
I spoke with the patient this morning and he seems to be continuing to have loose BMs. He is planning to undergo paracentesis this am for ascites. We will continue to treat him in radiation oncology provided that his [place] is appropriate. With his downward trending WBC, I've added on a Diff to his am labs. We will proceed with treatment provided his [place] is greater than 500.  Shona Simpson, [facility]

## 2016-01-15 NOTE — Progress Notes (Signed)
PT Cancellation Note  Patient Details Name: Lex Traudt MRN: [id number] DOB: 27-Oct-1941   Cancelled Treatment:    Reason Eval/Treat Not Completed: Patient at procedure or test/unavailable (pt at procedure, will follow. )   [name] 01/15/2016, 1:44 PM [phone]

## 2016-01-15 NOTE — Progress Notes (Signed)
[facility] PROGRESS NOTE  Oaks Cronister [id number] DOB: 1941-01-25 DOA: 01/12/2016 PCP: Woody Seller, MD  Brief interval history Antonio Hancock is a 75 y.o. male has a past medical history of Hypertension; Diabetes mellitus; Mobitz type 2 second degree AV block; Tobacco abuse; Pacemaker (2012); Chronotropic incompetence with sinus node dysfunction ([place]) (2012); Hyperlipidemia; HOH (hard of hearing); and Anxiety. Presented with chills while undergoing fluids infusion at cancer center. Patient has been dealing with diarrhea side effects of radiation therapy for his pancreatic cancer and has required repeated visits to oncology Center for fluids. Begin controlling his diarrhea with Imodium and was given a prescription for Lomotil. He's been having some nausea and vomiting. Started to have chills tonight his vital signs were checked while at [place] he was noted to be tachycardic and hypotensive while being on his second liter of normal saline at the clinic. Deneis any significant cough, no chest pain, no sick contacts IN ER: Patient developed fever while in emergency department up to 102.5 heart rate as high as 103 respirations up to 24. Blood pressure going systolics going down to mid 80s lactic acid unremarkable 2. UA and chest x-ray both did not show any evidence of infection. Influenza PCR was ordered Regarding pertinent past history: Patient has history of locally advanced pancreatic cancer diagnosed in November 2016 currently undergoing weekly radiation therapy status post ERCP and stent placement for biliary obstruction. currently on Xeloda. Patient has history of atrial fibrillation anticoagulated with Eliquis. He sp pacemaker due to symptomatic bradycardia. Patient used to have history of hypertension but had to discontinue all his blood pressure medications in the past secondary to recurrent hypotension in a setting of dehydration. Also had history of diabetes but given  massive weight loss no longer needs any medications Hospitalist was called for admission for persistent nausea vomiting diarrhea, resulting in dehydration with evidence of SIRS  Assessment/Plan: #1 sepsis Patient presenting with chills, fever with a temperature 102.5, tachycardia, tachypnea and hypotension with systolic blood pressure in the 80s. Patient with complaints of worsening multiple loose stools. Chest x-ray done was negative for any acute infiltrate. Patient has been pancultured results are pending. C. difficile PCR is negative. Stool GI pathogen panel is negative. Urine cultures with 50,000 colonies of Klebsiella pneumonia which is sensitive to ampicillin sulbactam, cephalosporins, fluoroquinolones, Bactrim. CT abdomen and pelvis with trace bilateral effusions with bibasilar airspace opacity which may reflect atelectasis or pneumonia. Patient currently afebrile. Some clinical improvement. Continue empiric IV vancomycin and IV Zosyn, IV Azithromycin. Follow.  #2 diarrhea Questionable etiology. 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 Continue Imodium as needed. Continue pancreatic enzyme/creon continue supportive care. Follow.   #3 probable urinary tract infection Urine cultures with 50,000 Klebsiella pneumonia. Patient on IV Zosyn. Follow.  #4 probable pneumonia Patient has been pancultured and cultures are pending. Continue nebulizers. Continue empiric IV vancomycin and IV Zosyn.   #5 pancytopenia Likely chemotherapy-induced. Patient recently on xeloda. Xeloda on hold. Will place on granix daily until [facility] is greater than 1.5. Continue empiric IV antibiotics. Supportive care. Oncology to assess.  #6 pancreatic cancer Patient received radiation treatment. Xeloda on hold. Will likely need outpatient follow-up.  #7  dehydration IV fluids.  #8 chronic diastolic  CHF Patient appears dehydrated. Patient on IV fluids. Monitor closely for volume overload.  #9 ascites Patient with noted ascites on exam and on CT scan. Status post ultrasound-guided abdominal paracentesis with 3.7 L of  fluid removed. Fluid studies pending. Continue empiric IV antibiotics. Follow.  #10 hypertension Continue to hold antihypertensive medications as patient blood pressure is borderline.  #11 status post permanent pacemaker  #12 atrial fibrillation Currently rate controlled. Anticoagulation on hold for paracentesis which was done today. Would likely resume anticoagulation hopefully in 24 hours.   #13 protein calorie malnutrition Nutritional supplementation.  #14 prophylaxis PPI for GI prophylaxis. SCDs for DVT prophylaxis.   Code Status: DO NOT RESUSCITATE Family Communication: Updated patient, wife, sister at bedside. Disposition Plan: Home when medically stable with improvement in diarrhea and ability to tolerate oral intake and sepsis workup is complete.   Consultants:  None  Procedures:  CT abdomen and pelvis 01/13/2016  chest x-ray 01/12/2016  Antibiotics:  IV azithromycin 01/13/2016  IV Zosyn 01/12/2016  IV vancomycin 01/12/2016  HPI/Subjective: Patient states feels much better after paracentesis. Patient denies any shortness of breath. No chest pain. Patient still with diarrhea.  Objective: Filed Vitals:   01/15/16 1420 01/15/16 1429  BP: 115/54 116/54  Pulse:    Temp:    Resp:      Intake/Output Summary (Last 24 hours) at 01/15/16 1530 Last data filed at 01/15/16 1400  Gross per 24 hour  Intake    800 ml  Output      0 ml  Net    800 ml   Filed Weights   01/12/16 2257  Weight: 66.679 kg (147 lb)    Exam:   General:  NAD. CACHETIC  Cardiovascular: RRR  Respiratory: Clear to auscultation bilaterally.  Abdomen: Soft,  Non distended, nontender to palpation, positive bowel sounds  Musculoskeletal: No clubbing cyanosis or  edema.  Data Reviewed: Basic Metabolic Panel:  Recent Labs Lab 01/12/16 1824 01/13/16 0155 01/14/16 0631 01/15/16 0547  NA 127* 125* 130* 132*  K 4.5 4.0 3.2* 3.4*  CL 102 102 106 111  CO2 18* 15* 17* 16*  GLUCOSE 109* 101* 84 86  BUN 18 18 15 11   CREATININE 1.48* 1.52* 1.38* 1.32*  CALCIUM 8.0* 7.4* 7.6* 7.5*  MG  --  1.3* 1.7 2.0  PHOS  --  2.7  --   --    Liver Function Tests:  Recent Labs Lab 01/12/16 1824 01/13/16 0155 01/14/16 0631 01/15/16 0547  AST 36 40 28 24  ALT 19 15* 13* 14*  ALKPHOS 118 101 81 75  BILITOT 1.4* 1.2 1.0 1.1  PROT 6.4* 5.8* 4.8* 4.6*  ALBUMIN 2.9* 2.5* 2.0* 1.9*   No results for input(s): LIPASE, AMYLASE in the last 168 hours. No results for input(s): AMMONIA in the last 168 hours. CBC:  Recent Labs Lab 01/12/16 1824 01/13/16 0155 01/14/16 0631 01/15/16 0547  WBC 3.5* 4.1 1.6* 1.2*  NEUTROABS 3.0  --  1.2* 0.8*  HGB 10.6* 9.2* 8.3* 8.0*  HCT 30.3* 25.8* 23.5* 22.8*  MCV 88.3 87.5 88.0 88.4  PLT 170 138* 109* 116*   Cardiac Enzymes: No results for input(s): CKTOTAL, CKMB, CKMBINDEX, TROPONINI in the last 168 hours. BNP (last 3 results) No results for input(s): BNP in the last 8760 hours.  ProBNP (last 3 results) No results for input(s): PROBNP in the last 8760 hours.  CBG:  Recent Labs Lab 01/14/16 2333 01/15/16 0359 01/15/16 0616 01/15/16 0731 01/15/16 1134  GLUCAP 105* 76 71 81 83    Recent Results (from the past 240 hour(s))  Urine culture     Status: None   Collection Time: 01/12/16  8:31 PM  Result Value Ref Range  Status   Specimen Description URINE, CLEAN CATCH  Final   Special Requests NONE  Final   Culture   Final    50,000 COLONIES/mL KLEBSIELLA PNEUMONIAE Performed at [facility]    Report Status 01/15/2016 FINAL  Final   Organism ID, Bacteria KLEBSIELLA PNEUMONIAE  Final      Susceptibility   Klebsiella pneumoniae - MIC*    AMPICILLIN >=32 RESISTANT Resistant     CEFAZOLIN <=4  SENSITIVE Sensitive     CEFTRIAXONE <=1 SENSITIVE Sensitive     CIPROFLOXACIN <=0.25 SENSITIVE Sensitive     GENTAMICIN <=1 SENSITIVE Sensitive     IMIPENEM <=0.25 SENSITIVE Sensitive     NITROFURANTOIN 64 INTERMEDIATE Intermediate     TRIMETH/SULFA <=20 SENSITIVE Sensitive     AMPICILLIN/SULBACTAM 4 SENSITIVE Sensitive     PIP/TAZO <=4 SENSITIVE Sensitive     * 50,000 COLONIES/mL KLEBSIELLA PNEUMONIAE  Blood Culture (routine x 2)     Status: None (Preliminary result)   Collection Time: 01/13/16 12:16 AM  Result Value Ref Range Status   Specimen Description BLOOD LEFT ANTECUBITAL  Final   Special Requests IN PEDIATRIC BOTTLE 1CC  Final   Culture   Final    NO GROWTH 1 DAY Performed at [facility]    Report Status PENDING  Incomplete  Blood Culture (routine x 2)     Status: None (Preliminary result)   Collection Time: 01/13/16 12:16 AM  Result Value Ref Range Status   Specimen Description BLOOD LEFT WRIST  Final   Special Requests BOTTLES DRAWN AEROBIC AND ANAEROBIC 5CC  Final   Culture   Final    NO GROWTH 1 DAY Performed at [facility]    Report Status PENDING  Incomplete  Gastrointestinal Panel by PCR , Stool     Status: None   Collection Time: 01/13/16  1:23 AM  Result Value Ref Range Status   Campylobacter species NOT DETECTED NOT DETECTED Final   Plesimonas shigelloides NOT DETECTED NOT DETECTED Final   Salmonella species NOT DETECTED NOT DETECTED Final   Yersinia enterocolitica NOT DETECTED NOT DETECTED Final   Vibrio species NOT DETECTED NOT DETECTED Final   Vibrio cholerae NOT DETECTED NOT DETECTED Final   Enteroaggregative E coli (EAEC) NOT DETECTED NOT DETECTED Final   Enteropathogenic E coli (EPEC) NOT DETECTED NOT DETECTED Final   Enterotoxigenic E coli (ETEC) NOT DETECTED NOT DETECTED Final   Shiga like toxin producing E coli (STEC) NOT DETECTED NOT DETECTED Final   E. coli O157 NOT DETECTED NOT DETECTED Final   Shigella/Enteroinvasive E coli  (EIEC) NOT DETECTED NOT DETECTED Final   Cryptosporidium NOT DETECTED NOT DETECTED Final   Cyclospora cayetanensis NOT DETECTED NOT DETECTED Final   Entamoeba histolytica NOT DETECTED NOT DETECTED Final   Giardia lamblia NOT DETECTED NOT DETECTED Final   Adenovirus F40/41 NOT DETECTED NOT DETECTED Final   Astrovirus NOT DETECTED NOT DETECTED Final   Norovirus GI/GII NOT DETECTED NOT DETECTED Final   Rotavirus A NOT DETECTED NOT DETECTED Final   Sapovirus (I, II, IV, and V) NOT DETECTED NOT DETECTED Final  C difficile quick scan w PCR reflex     Status: None   Collection Time: 01/13/16  1:23 AM  Result Value Ref Range Status   C Diff antigen NEGATIVE NEGATIVE Final   C Diff toxin NEGATIVE NEGATIVE Final   C Diff interpretation Negative for toxigenic C. difficile  Final     Studies: US Paracentesis  01/15/2016  INDICATION:  History of pancreatic cancer with new onset ascites. Request has been made for diagnostic and therapeutic paracentesis EXAM: ULTRASOUND GUIDED right upper quadrant PARACENTESIS MEDICATIONS: 1% lidocaine COMPLICATIONS: None immediate. PROCEDURE: Informed written consent was obtained from the patient after a discussion of the risks, benefits and alternatives to treatment. A timeout was performed prior to the initiation of the procedure. Initial ultrasound scanning demonstrates a moderate amount of ascites within the right upper abdominal quadrant. The right upper abdomen was prepped and draped in the usual sterile fashion. 1% lidocaine was used for local anesthesia. Following this, a Safe-T-Centesis catheter was introduced. An ultrasound image was saved for documentation purposes. The paracentesis was performed. The catheter was removed and a dressing was applied. The patient tolerated the procedure well without immediate post procedural complication. FINDINGS: A total of approximately 3.7 L of clear yellow fluid was removed. Samples were sent to the laboratory as requested by the  clinical team. IMPRESSION: Successful ultrasound-guided paracentesis yielding 3.7 liters of peritoneal fluid. Read by: Saverio Danker, PA-C Electronically Signed   By: Lucrezia Europe M.D.   On: 01/15/2016 14:47    Scheduled Meds: . ALPRAZolam  0.25 mg Oral QHS  . atorvastatin  10 mg Oral Daily  . azithromycin  500 mg Intravenous Q24H  . feeding supplement  1 Container Oral TID BM  . insulin aspart  0-9 Units Subcutaneous 6 times per day  . lipase/protease/amylase  24,000 Units Oral TID WC  . morphine  30 mg Oral q morning - 10a  . nicotine  21 mg Transdermal Daily  . pantoprazole  40 mg Oral Q1200  . piperacillin-tazobactam (ZOSYN)  IV  3.375 g Intravenous 3 times per day  . vancomycin  500 mg Intravenous Q12H   Continuous Infusions: . dextrose 5 % and 0.9% NaCl 100 mL/hr at 01/15/16 [id number]    Principal Problem:   Severe sepsis [facility]) Active Problems:   Hypertension   Diabetes mellitus (HCC)   Tobacco abuse   Pacemaker-[employer]   Atrial fibrillation (HCC)   Obstructive sleep apnea   Hyponatremia   Malignant neoplasm of head of pancreas (HCC)   Dehydration   Chronic diastolic CHF (congestive heart failure), NYHA class 1 (HCC)   CKD (chronic kidney disease) stage 3, GFR 30-59 ml/min   Hypomagnesemia   Ascites   Pneumonia   Colitis   Protein-calorie malnutrition, severe   Neonatal sepsis due to group B Streptococcus ([place])    Time spent: 61 minutes    THOMPSON,DANIEL M.D. [facility] Pager [phone]. If 7PM-7AM, please contact night-coverage at [email or web address], password [email or web address] 01/15/2016, 3:30 PM  LOS: 1 day

## 2016-01-16 ENCOUNTER — Ambulatory Visit: Payer: BLUE CROSS/BLUE SHIELD

## 2016-01-16 ENCOUNTER — Ambulatory Visit
Admission: RE | Admit: 2016-01-16 | Discharge: 2016-01-16 | Disposition: A | Discharge status: Home / Self Care | Payer: BLUE CROSS/BLUE SHIELD | Source: Ambulatory Visit | Attending: Radiation Oncology | Admitting: Radiation Oncology

## 2016-01-16 ENCOUNTER — Encounter: Payer: Self-pay | Admitting: Radiation Oncology

## 2016-01-16 ENCOUNTER — Ambulatory Visit
Admit: 2016-01-16 | Discharge: 2016-01-16 | Disposition: A | Discharge status: Home / Self Care | Payer: BLUE CROSS/BLUE SHIELD | Attending: Radiation Oncology | Admitting: Radiation Oncology

## 2016-01-16 VITALS — BP 103/57 | HR 75 | Temp 97.9°F | Resp 20

## 2016-01-16 DIAGNOSIS — I959 Hypotension, unspecified: Secondary | ICD-10-CM

## 2016-01-16 DIAGNOSIS — C259 Malignant neoplasm of pancreas, unspecified: Secondary | ICD-10-CM

## 2016-01-16 DIAGNOSIS — E43 Unspecified severe protein-calorie malnutrition: Secondary | ICD-10-CM

## 2016-01-16 DIAGNOSIS — Z510 Encounter for antineoplastic radiation therapy: Secondary | ICD-10-CM | POA: Diagnosis not present

## 2016-01-16 DIAGNOSIS — R627 Adult failure to thrive: Secondary | ICD-10-CM

## 2016-01-16 DIAGNOSIS — C250 Malignant neoplasm of head of pancreas: Secondary | ICD-10-CM

## 2016-01-16 DIAGNOSIS — R197 Diarrhea, unspecified: Secondary | ICD-10-CM

## 2016-01-16 DIAGNOSIS — E876 Hypokalemia: Secondary | ICD-10-CM

## 2016-01-16 LAB — CBC WITH DIFFERENTIAL/PLATELET
Basophils Absolute: 0 10*3/uL (ref 0.0–0.1)
Basophils Relative: 0 %
EOS ABS: 0.2 10*3/uL (ref 0.0–0.7)
EOS PCT: 4 %
HCT: 24.9 % — ABNORMAL LOW (ref 39.0–52.0)
Hemoglobin: 8.7 g/dL — ABNORMAL LOW (ref 13.0–17.0)
LYMPHS ABS: 0.2 10*3/uL — AB (ref 0.7–4.0)
Lymphocytes Relative: 4 %
MCH: 30.9 pg (ref 26.0–34.0)
MCHC: 34.9 g/dL (ref 30.0–36.0)
MCV: 88.3 fL (ref 78.0–100.0)
MONO ABS: 0.3 10*3/uL (ref 0.1–1.0)
Monocytes Relative: 6 %
Neutro Abs: 3.8 10*3/uL (ref 1.7–7.7)
Neutrophils Relative %: 86 %
Platelets: 120 10*3/uL — ABNORMAL LOW (ref 150–400)
RBC: 2.82 MIL/uL — AB (ref 4.22–5.81)
RDW: 17.4 % — ABNORMAL HIGH (ref 11.5–15.5)
WBC: 4.5 10*3/uL (ref 4.0–10.5)

## 2016-01-16 LAB — GLUCOSE, CAPILLARY
GLUCOSE-CAPILLARY: 106 mg/dL — AB (ref 65–99)
GLUCOSE-CAPILLARY: 86 mg/dL (ref 65–99)
GLUCOSE-CAPILLARY: 93 mg/dL (ref 65–99)
Glucose-Capillary: 92 mg/dL (ref 65–99)
Glucose-Capillary: 91 mg/dL (ref 65–99)
Glucose-Capillary: 104 mg/dL — ABNORMAL HIGH (ref 65–99)
Glucose-Capillary: 112 mg/dL — ABNORMAL HIGH (ref 65–99)

## 2016-01-16 LAB — COMPREHENSIVE METABOLIC PANEL
ALK PHOS: 73 U/L (ref 38–126)
ALT: 15 U/L — AB (ref 17–63)
AST: 28 U/L (ref 15–41)
Albumin: 1.9 g/dL — ABNORMAL LOW (ref 3.5–5.0)
Anion gap: 7 (ref 5–15)
BILIRUBIN TOTAL: 1.1 mg/dL (ref 0.3–1.2)
BUN: 8 mg/dL (ref 6–20)
CALCIUM: 7.5 mg/dL — AB (ref 8.9–10.3)
CO2: 16 mmol/L — AB (ref 22–32)
CREATININE: 1.27 mg/dL — AB (ref 0.61–1.24)
Chloride: 110 mmol/L (ref 101–111)
GFR, EST NON AFRICAN AMERICAN: 54 mL/min — AB (ref >60)
Glucose, Bld: 93 mg/dL (ref 65–99)
Potassium: 3.1 mmol/L — ABNORMAL LOW (ref 3.5–5.1)
Sodium: 133 mmol/L — ABNORMAL LOW (ref 135–145)
TOTAL PROTEIN: 4.7 g/dL — AB (ref 6.5–8.1)

## 2016-01-16 LAB — TRIGLYCERIDES, BODY FLUIDS: TRIGLYCERIDES FL: 19 mg/dL

## 2016-01-16 LAB — MAGNESIUM: MAGNESIUM: 1.6 mg/dL — AB (ref 1.7–2.4)

## 2016-01-16 MED ORDER — MAGNESIUM SULFATE 4 GM/100ML IV SOLN
4.0000 g | Freq: Once | INTRAVENOUS | Status: AC
  Administered 2016-01-16: 4 g via INTRAVENOUS
  Filled 2016-01-16: qty 100

## 2016-01-16 MED ORDER — DEXTROSE 5 % IV SOLN
2.0000 g | INTRAVENOUS | Status: DC
  Administered 2016-01-16 – 2016-01-19 (×4): 2 g via INTRAVENOUS
  Filled 2016-01-16 (×4): qty 2

## 2016-01-16 MED ORDER — APIXABAN 5 MG PO TABS
5.0000 mg | ORAL_TABLET | Freq: Two times a day (BID) | ORAL | Status: DC
Start: 2016-01-16 — End: 2016-01-21
  Administered 2016-01-16 – 2016-01-21 (×11): 5 mg via ORAL
  Filled 2016-01-16 (×12): qty 1

## 2016-01-16 MED ORDER — FLUTICASONE PROPIONATE 50 MCG/ACT NA SUSP
2.0000 | Freq: Every day | NASAL | Status: DC
  Administered 2016-01-16 – 2016-01-21 (×6): 2 via NASAL
  Filled 2016-01-16: qty 16

## 2016-01-16 MED ORDER — LORATADINE 10 MG PO TABS
10.0000 mg | ORAL_TABLET | Freq: Every day | ORAL | Status: DC
  Administered 2016-01-16 – 2016-01-21 (×6): 10 mg via ORAL
  Filled 2016-01-16 (×6): qty 1

## 2016-01-16 MED ORDER — POTASSIUM CHLORIDE CRYS ER 20 MEQ PO TBCR
40.0000 meq | EXTENDED_RELEASE_TABLET | ORAL | Status: AC
  Administered 2016-01-16 (×2): 40 meq via ORAL
  Filled 2016-01-16 (×2): qty 2

## 2016-01-16 NOTE — Progress Notes (Signed)
Physical Therapy Treatment Patient Details Name: Antonio Hancock MRN: [id number] DOB: 07-Mar-1941 Today's Date: 01/16/2016    History of Present Illness 75 y.o. male with h/o pancreatic cancer admitted with N/V/D, ascites, sepsis. H/o HTN, DM, pacemaker, a fib, OSA, CHF.     PT Comments    Pt ambulated 18' with RW without loss of balance. Min assist for sit to stand transfers. Pt stated he has family that can stay with him at home while his wife works.   Follow Up Recommendations  Supervision for mobility/OOB;Home health PT     Equipment Recommendations  Rolling walker with 5" wheels    Recommendations for Other Services OT consult     Precautions / Restrictions Precautions Precautions: Fall Restrictions Weight Bearing Restrictions: No    Mobility  Bed Mobility Overal bed mobility: Modified Independent             General bed mobility comments: used rail, HOB up 35*  Transfers Overall transfer level: Needs assistance Equipment used: Rolling walker (2 wheeled) Transfers: Sit to/from [facility] Sit to Stand: Min assist Stand pivot transfers: Min assist       General transfer comment: min A to power up, verbal cues for hand placement  Ambulation/Gait Ambulation/Gait assistance: Min guard Ambulation Distance (Feet): 80 Feet Assistive device: Rolling walker (2 wheeled) Gait Pattern/deviations: Step-through pattern;Trunk flexed     General Gait Details: verbal cues for posture, no LOB   Stairs            Wheelchair Mobility    Modified Rankin (Stroke Patients Only)       Balance Overall balance assessment: Modified Independent (pt stood with RW at EOB x 1 minute, performed marching in place without LOB)                                  Cognition Arousal/Alertness: Awake/alert Behavior During Therapy: WFL for tasks assessed/performed Overall Cognitive Status: Within Functional Limits for tasks assessed                       Exercises      General Comments        Pertinent Vitals/Pain Pain Assessment: No/denies pain    Home Living Family/patient expects to be discharged to:: Private residence Living Arrangements: Spouse/significant other Available Help at Discharge: Family;Available PRN/intermittently Type of Home: House Home Access: Stairs to enter Entrance Stairs-Rails: Right;Left Home Layout: One level Home Equipment: None Additional Comments: wife works 12* day shift    Prior Function Level of Independence: Independent          PT Goals (current goals can now be found in the care plan section) Acute Rehab PT Goals Patient Stated Goal: to get stronger PT Goal Formulation: With patient/family Time For Goal Achievement: 01/30/16 Potential to Achieve Goals: Good Progress towards PT goals: Progressing toward goals    Frequency  Min 3X/week    PT Plan Current plan remains appropriate    Co-evaluation             End of Session Equipment Utilized During Treatment: Gait belt Activity Tolerance: Patient limited by fatigue Patient left: in bed;with call bell/phone within reach;with family/visitor present     Time: [id number] PT Time Calculation (min) (ACUTE ONLY): 15 min  Charges:  $Gait Training: 8-22 mins  G Codes:      Antonio Hancock 01/16/2016, 2:59 PM [phone]

## 2016-01-16 NOTE — Evaluation (Signed)
Physical Therapy Evaluation Patient Details Name: Antonio Hancock MRN: [id number] DOB: 07-Aug-1941 Today's Date: 01/16/2016   History of Present Illness  75 y.o. male with h/o pancreatic cancer admitted with N/V/D, ascites, sepsis. H/o HTN, DM, pacemaker, a fib, OSA, CHF.   Clinical Impression  Pt admitted with above diagnosis. Pt currently with functional limitations due to the deficits listed below (see PT Problem List).  Pt stood briefly with RW at EOB and performed marching in standing, he declined ambulation due to wanting to rest prior to radiation.  Pt will benefit from skilled PT to increase their independence and safety with mobility to allow discharge to the venue listed below.       Follow Up Recommendations Supervision for mobility/OOB;Home health PT    Equipment Recommendations  Rolling walker with 5" wheels    Recommendations for Other Services OT consult     Precautions / Restrictions Precautions Precautions: Fall Restrictions Weight Bearing Restrictions: No      Mobility  Bed Mobility Overal bed mobility: Modified Independent             General bed mobility comments: used rail, HOB up 35*  Transfers Overall transfer level: Needs assistance Equipment used: Rolling walker (2 wheeled) Transfers: Sit to/from Stand Sit to Stand: Min assist         General transfer comment: min A to power up, verbal cues for hand placement  Ambulation/Gait             General Gait Details: pt refused ambulation, stated he needed to rest prior to radiation tx  Stairs            Wheelchair Mobility    Modified Rankin (Stroke Patients Only)       Balance Overall balance assessment: Modified Independent (pt stood with RW at EOB x 1 minute, performed marching in place without LOB)                                           Pertinent Vitals/Pain Pain Assessment: No/denies pain    Home Living Family/patient expects to be discharged  to:: Private residence Living Arrangements: Spouse/significant other Available Help at Discharge: Family;Available PRN/intermittently Type of Home: House Home Access: Stairs to enter Entrance Stairs-Rails: Psychiatric nurse of Steps: 5 Home Layout: One level Home Equipment: None Additional Comments: wife works 12* day shift    Prior Function Level of Independence: Independent               Journalist, newspaper        Extremity/Trunk Assessment   Upper Extremity Assessment: Defer to OT evaluation           Lower Extremity Assessment: Generalized weakness      Cervical / Trunk Assessment: Kyphotic  Communication   Communication: HOH  Cognition Arousal/Alertness: Awake/alert Behavior During Therapy: WFL for tasks assessed/performed Overall Cognitive Status: Within Functional Limits for tasks assessed                      General Comments      Exercises        Assessment/Plan    PT Assessment Patient needs continued PT services  PT Diagnosis Generalized weakness   PT Problem List Decreased activity tolerance;Decreased mobility;Decreased strength;Decreased knowledge of use of DME  PT Treatment Interventions Gait training;DME instruction;Stair training;Functional mobility training;Therapeutic activities;Patient/family education;Therapeutic exercise;Balance training  PT Goals (Current goals can be found in the Care Plan section) Acute Rehab PT Goals Patient Stated Goal: to get stronger PT Goal Formulation: With patient/family Time For Goal Achievement: 01/30/16 Potential to Achieve Goals: Good    Frequency Min 3X/week   Barriers to discharge Decreased caregiver support      Co-evaluation               End of Session Equipment Utilized During Treatment: Gait belt Activity Tolerance: Patient limited by fatigue Patient left: in bed;with call bell/phone within reach;with family/visitor present Nurse Communication: Mobility  status         Time: 1219-1227 PT Time Calculation (min) (ACUTE ONLY): 8 min   Charges:   PT Evaluation $PT Eval Low Complexity: 1 Procedure     PT G CodesPhilomena Doheny 01/16/2016, 1:02 PM [phone]

## 2016-01-16 NOTE — Progress Notes (Signed)
Weekly rad txs pancreas 26/28 completed, in patient  Had parcentesis yesterday  And 3.7 liters fluid removed, patient is feeling better, no pain or nausea at present time, fatigued and weak, ate grilled chees sandwich for lunch and that has stayed down,  1:58 PM BP 103/57 mmHg  Pulse 75  Temp(Src) 97.9 F (36.6 C) (Oral)  Resp 20  SpO2 97%  Wt Readings from Last 3 Encounters:  01/12/16 147 lb (66.679 kg)  01/09/16 147 lb 4.8 oz (66.815 kg)  01/08/16 145 lb 1.6 oz (65.817 kg)

## 2016-01-16 NOTE — Progress Notes (Signed)
[facility] PROGRESS NOTE  Antonio Hancock [id number] DOB: May 20, 1941 DOA: 01/12/2016 PCP: Woody Seller, MD  Brief interval history Antonio Hancock is a 75 y.o. male has a past medical history of Hypertension; Diabetes mellitus; Mobitz type 2 second degree AV block; Tobacco abuse; Pacemaker (2012); Chronotropic incompetence with sinus node dysfunction ([place]) (2012); Hyperlipidemia; HOH (hard of hearing); and Anxiety. Presented with chills while undergoing fluids infusion at cancer center. Patient has been dealing with diarrhea side effects of radiation therapy for his pancreatic cancer and has required repeated visits to oncology Center for fluids. Begin controlling his diarrhea with Imodium and was given a prescription for Lomotil. He's been having some nausea and vomiting. Started to have chills tonight his vital signs were checked while at [facility] he was noted to be tachycardic and hypotensive while being on his second liter of normal saline at the clinic. Deneis any significant cough, no chest pain, no sick contacts IN ER: Patient developed fever while in emergency department up to 102.5 heart rate as high as 103 respirations up to 24. Blood pressure going systolics going down to mid 80s lactic acid unremarkable 2. UA and chest x-ray both did not show any evidence of infection. Influenza PCR was ordered Regarding pertinent past history: Patient has history of locally advanced pancreatic cancer diagnosed in November 2016 currently undergoing weekly radiation therapy status post ERCP and stent placement for biliary obstruction. currently on Xeloda. Patient has history of atrial fibrillation anticoagulated with Eliquis. He sp pacemaker due to symptomatic bradycardia. Patient used to have history of hypertension but had to discontinue all his blood pressure medications in the past secondary to recurrent hypotension in a setting of dehydration. Also had history of diabetes but given  massive weight loss no longer needs any medications Hospitalist was called for admission for persistent nausea vomiting diarrhea, resulting in dehydration with evidence of SIRS  Assessment/Plan: #1 sepsis Patient presenting with chills, fever with a temperature 102.5, tachycardia, tachypnea and hypotension with systolic blood pressure in the 80s. Patient with complaints of worsening multiple loose stools. Chest x-ray done was negative for any acute infiltrate. Patient has been pancultured results are pending. C. difficile PCR is negative. Stool GI pathogen panel is negative. Urine cultures with 50,000 colonies of Klebsiella pneumonia which is sensitive to ampicillin sulbactam, cephalosporins, fluoroquinolones, Bactrim. CT abdomen and pelvis with trace bilateral effusions with bibasilar airspace opacity which may reflect atelectasis or pneumonia. Patient currently afebrile. Some clinical improvement. Discontinue IV vancomycin and IV Zosyn. IV azithromycin has been discontinued. Place on IV Rocephin.  #2 diarrhea Questionable etiology. 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 Continue Imodium as needed. Continue pancreatic enzyme/creon continue supportive care. Follow.   #3 probable urinary tract infection Urine cultures with 50,000 Klebsiella pneumonia. Change IV Zosyn to IV Rocephin. Follow.  #4 probable pneumonia Patient has been pancultured and cultures are pending. Continue nebulizers. Discontinue IV vancomycin and IV Zosyn. Place on IV Rocephin. Patient is status post 3 days of azithromycin. Supportive care.   #5 pancytopenia Likely chemotherapy-induced. Patient recently on xeloda. Xeloda on hold. Status post 1 dose granix. ANC at 3.8. Supportive care. Oncology ff.  #6 pancreatic cancer Patient receiving radiation treatment. Xeloda on hold. Will likely need outpatient  follow-up.  #7  dehydration IV fluids.  #8 chronic diastolic CHF Patient appears dehydrated. Patient on IV fluids. Monitor closely for volume overload.  #9 ascites Patient with noted ascites on exam and  on CT scan. Status post ultrasound-guided abdominal paracentesis with 3.7 L of fluid removed. Fluid studies pending. Cytology with atypical cells. Continue empiric IV antibiotics. Follow.  #10 hypertension Continue to hold antihypertensive medications as patient blood pressure is borderline.  #11 status post permanent pacemaker  #12 atrial fibrillation Currently rate controlled. Resume anticoagulation.  #13 protein calorie malnutrition Nutritional supplementation.  #14 prophylaxis PPI for GI prophylaxis. SCDs for DVT prophylaxis.   Code Status: DO NOT RESUSCITATE Family Communication: Updated patient, wife, sister at bedside. Disposition Plan: Home when medically stable with improvement in diarrhea and ability to tolerate oral intake and sepsis workup is complete.   Consultants:  Interventional radiology  Oncology: Dr. Alen Blew 01/16/2016  Procedures:  CT abdomen and pelvis 01/13/2016  chest x-ray 01/12/2016  Paracentesis 01/15/2016  Antibiotics:  IV azithromycin 01/13/2016>>>> 01/16/2016  IV Zosyn 01/12/2016>>>>> 01/16/2016  IV vancomycin 01/12/2016>>>>> 01/16/2016  IV Rocephin 01/16/2016  HPI/Subjective: Patient states feels much better after paracentesis. Patient denies any shortness of breath. No chest pain. Patient still with diarrhea which he feels maybe improving.  Objective: Filed Vitals:   01/16/16 0752 01/16/16 1319  BP: 103/55 95/61  Pulse: 62 86  Temp: 98.3 F (36.8 C) 97.6 F (36.4 C)  Resp: 18     Intake/Output Summary (Last 24 hours) at 01/16/16 1533 Last data filed at 01/16/16 1300  Gross per 24 hour  Intake   1698 ml  Output     75 ml  Net   1623 ml   Filed Weights   01/12/16 2257  Weight: 66.679 kg (147 lb)     Exam:   General:  NAD. CACHETIC  Cardiovascular: RRR  Respiratory: Clear to auscultation bilaterally.  Abdomen: Soft,  Non distended, nontender to palpation, positive bowel sounds  Musculoskeletal: No clubbing cyanosis or edema.  Data Reviewed: Basic Metabolic Panel:  Recent Labs Lab 01/12/16 1824 01/13/16 0155 01/14/16 0631 01/15/16 0547 01/16/16 0537  NA 127* 125* 130* 132* 133*  K 4.5 4.0 3.2* 3.4* 3.1*  CL 102 102 106 111 110  CO2 18* 15* 17* 16* 16*  GLUCOSE 109* 101* 84 86 93  BUN 18 18 15 11 8   CREATININE 1.48* 1.52* 1.38* 1.32* 1.27*  CALCIUM 8.0* 7.4* 7.6* 7.5* 7.5*  MG  --  1.3* 1.7 2.0 1.6*  PHOS  --  2.7  --   --   --    Liver Function Tests:  Recent Labs Lab 01/12/16 1824 01/13/16 0155 01/14/16 0631 01/15/16 0547 01/16/16 0537  AST 36 40 28 24 28   ALT 19 15* 13* 14* 15*  ALKPHOS 118 101 81 75 73  BILITOT 1.4* 1.2 1.0 1.1 1.1  PROT 6.4* 5.8* 4.8* 4.6* 4.7*  ALBUMIN 2.9* 2.5* 2.0* 1.9* 1.9*   No results for input(s): LIPASE, AMYLASE in the last 168 hours. No results for input(s): AMMONIA in the last 168 hours. CBC:  Recent Labs Lab 01/12/16 1824 01/13/16 0155 01/14/16 0631 01/15/16 0547 01/16/16 0537  WBC 3.5* 4.1 1.6* 1.2* 4.5  NEUTROABS 3.0  --  1.2* 0.8* 3.8  HGB 10.6* 9.2* 8.3* 8.0* 8.7*  HCT 30.3* 25.8* 23.5* 22.8* 24.9*  MCV 88.3 87.5 88.0 88.4 88.3  PLT 170 138* 109* 116* 120*   Cardiac Enzymes: No results for input(s): CKTOTAL, CKMB, CKMBINDEX, TROPONINI in the last 168 hours. BNP (last 3 results) No results for input(s): BNP in the last 8760 hours.  ProBNP (last 3 results) No results for input(s): PROBNP in the last 8760 hours.  CBG:  Recent Labs Lab 01/15/16 2014 01/16/16 0007 01/16/16 0432 01/16/16 0750 01/16/16 1145  GLUCAP 100* 93 92 86 91    Recent Results (from the past 240 hour(s))  Urine culture     Status: None   Collection Time: 01/12/16  8:31 PM  Result Value Ref Range Status   Specimen  Description URINE, CLEAN CATCH  Final   Special Requests NONE  Final   Culture   Final    50,000 COLONIES/mL KLEBSIELLA PNEUMONIAE Performed at [facility]    Report Status 01/15/2016 FINAL  Final   Organism ID, Bacteria KLEBSIELLA PNEUMONIAE  Final      Susceptibility   Klebsiella pneumoniae - MIC*    AMPICILLIN >=32 RESISTANT Resistant     CEFAZOLIN <=4 SENSITIVE Sensitive     CEFTRIAXONE <=1 SENSITIVE Sensitive     CIPROFLOXACIN <=0.25 SENSITIVE Sensitive     GENTAMICIN <=1 SENSITIVE Sensitive     IMIPENEM <=0.25 SENSITIVE Sensitive     NITROFURANTOIN 64 INTERMEDIATE Intermediate     TRIMETH/SULFA <=20 SENSITIVE Sensitive     AMPICILLIN/SULBACTAM 4 SENSITIVE Sensitive     PIP/TAZO <=4 SENSITIVE Sensitive     * 50,000 COLONIES/mL KLEBSIELLA PNEUMONIAE  Blood Culture (routine x 2)     Status: None (Preliminary result)   Collection Time: 01/13/16 12:16 AM  Result Value Ref Range Status   Specimen Description BLOOD LEFT ANTECUBITAL  Final   Special Requests IN PEDIATRIC BOTTLE 1CC  Final   Culture   Final    NO GROWTH 3 DAYS Performed at [facility]    Report Status PENDING  Incomplete  Blood Culture (routine x 2)     Status: None (Preliminary result)   Collection Time: 01/13/16 12:16 AM  Result Value Ref Range Status   Specimen Description BLOOD LEFT WRIST  Final   Special Requests BOTTLES DRAWN AEROBIC AND ANAEROBIC 5CC  Final   Culture   Final    NO GROWTH 3 DAYS Performed at [facility]    Report Status PENDING  Incomplete  Gastrointestinal Panel by PCR , Stool     Status: None   Collection Time: 01/13/16  1:23 AM  Result Value Ref Range Status   Campylobacter species NOT DETECTED NOT DETECTED Final   Plesimonas shigelloides NOT DETECTED NOT DETECTED Final   Salmonella species NOT DETECTED NOT DETECTED Final   Yersinia enterocolitica NOT DETECTED NOT DETECTED Final   Vibrio species NOT DETECTED NOT DETECTED Final   Vibrio cholerae NOT  DETECTED NOT DETECTED Final   Enteroaggregative E coli (EAEC) NOT DETECTED NOT DETECTED Final   Enteropathogenic E coli (EPEC) NOT DETECTED NOT DETECTED Final   Enterotoxigenic E coli (ETEC) NOT DETECTED NOT DETECTED Final   Shiga like toxin producing E coli (STEC) NOT DETECTED NOT DETECTED Final   E. coli O157 NOT DETECTED NOT DETECTED Final   Shigella/Enteroinvasive E coli (EIEC) NOT DETECTED NOT DETECTED Final   Cryptosporidium NOT DETECTED NOT DETECTED Final   Cyclospora cayetanensis NOT DETECTED NOT DETECTED Final   Entamoeba histolytica NOT DETECTED NOT DETECTED Final   Giardia lamblia NOT DETECTED NOT DETECTED Final   Adenovirus F40/41 NOT DETECTED NOT DETECTED Final   Astrovirus NOT DETECTED NOT DETECTED Final   Norovirus GI/GII NOT DETECTED NOT DETECTED Final   Rotavirus A NOT DETECTED NOT DETECTED Final   Sapovirus (I, II, IV, and V) NOT DETECTED NOT DETECTED Final  C difficile quick scan w PCR reflex     Status:  None   Collection Time: 01/13/16  1:23 AM  Result Value Ref Range Status   C Diff antigen NEGATIVE NEGATIVE Final   C Diff toxin NEGATIVE NEGATIVE Final   C Diff interpretation Negative for toxigenic C. difficile  Final  Anaerobic culture     Status: None (Preliminary result)   Collection Time: 01/15/16  2:20 PM  Result Value Ref Range Status   Specimen Description PERITONEAL  Final   Special Requests NONE  Final   Gram Stain   Final    NO WBC SEEN NO SQUAMOUS EPITHELIAL CELLS SEEN NO ORGANISMS SEEN Performed at [facility]    Culture PENDING  Incomplete   Report Status PENDING  Incomplete  Culture, body fluid-bottle     Status: None (Preliminary result)   Collection Time: 01/15/16  2:20 PM  Result Value Ref Range Status   Specimen Description PERITONEAL  Final   Special Requests BOTTLES DRAWN AEROBIC AND ANAEROBIC 10MLS  Final   Culture   Final    NO GROWTH < 24 HOURS Performed at [facility]    Report Status PENDING  Incomplete  Gram  stain     Status: None   Collection Time: 01/15/16  2:20 PM  Result Value Ref Range Status   Specimen Description FLUID PERITONEAL  Final   Special Requests NONE  Final   Gram Stain   Final    FEW WBC PRESENT, PREDOMINANTLY MONONUCLEAR NO ORGANISMS SEEN Performed at [facility]    Report Status 01/15/2016 FINAL  Final     Studies: US Paracentesis  01/15/2016  INDICATION: History of pancreatic cancer with new onset ascites. Request has been made for diagnostic and therapeutic paracentesis EXAM: ULTRASOUND GUIDED right upper quadrant PARACENTESIS MEDICATIONS: 1% lidocaine COMPLICATIONS: None immediate. PROCEDURE: Informed written consent was obtained from the patient after a discussion of the risks, benefits and alternatives to treatment. A timeout was performed prior to the initiation of the procedure. Initial ultrasound scanning demonstrates a moderate amount of ascites within the right upper abdominal quadrant. The right upper abdomen was prepped and draped in the usual sterile fashion. 1% lidocaine was used for local anesthesia. Following this, a Safe-T-Centesis catheter was introduced. An ultrasound image was saved for documentation purposes. The paracentesis was performed. The catheter was removed and a dressing was applied. The patient tolerated the procedure well without immediate post procedural complication. FINDINGS: A total of approximately 3.7 L of clear yellow fluid was removed. Samples were sent to the laboratory as requested by the clinical team. IMPRESSION: Successful ultrasound-guided paracentesis yielding 3.7 liters of peritoneal fluid. Read by: Saverio Danker, PA-C Electronically Signed   By: Lucrezia Europe M.D.   On: 01/15/2016 14:47    Scheduled Meds: . ALPRAZolam  0.25 mg Oral QHS  . apixaban  5 mg Oral BID  . arformoterol  15 mcg Nebulization BID  . atorvastatin  10 mg Oral Daily  . budesonide (PULMICORT) nebulizer solution  0.25 mg Nebulization BID  . cefTRIAXone  (ROCEPHIN)  IV  2 g Intravenous Q24H  . insulin aspart  0-9 Units Subcutaneous 6 times per day  . lipase/protease/amylase  24,000 Units Oral TID WC  . morphine  30 mg Oral q morning - 10a  . nicotine  21 mg Transdermal Daily  . pantoprazole  40 mg Oral Q1200  . tiotropium  18 mcg Inhalation Daily  . vancomycin  500 mg Intravenous Q12H   Continuous Infusions: . dextrose 5 % and 0.9% NaCl 100  mL/hr at 01/15/16 [id number]    Principal Problem:   Severe sepsis [facility]) Active Problems:   Hypertension   Diabetes mellitus ([place])   Tobacco abuse   Pacemaker-[employer]   Atrial fibrillation (H[facility])   Obstructive sleep apnea   Hyponatremia   Malignant neoplasm of head of pancreas (HCC)   Dehydration   Chronic diastolic CHF (congestive heart failure), NYHA class 1 (HCC)   CKD (chronic kidney disease) stage 3, GFR 30-59 ml/min   Hypomagnesemia   Ascites   Pneumonia   Colitis   Protein-calorie malnutrition, severe   Neonatal sepsis due to group B Streptococcus (HCC)   Antineoplastic chemotherapy induced pancytopenia (HCC)   Acute cystitis without hematuria   Hypokalemia    Time spent: 65 minutes    Micheal Murad M.D. [facility] Pager [phone]. If 7PM-7AM, please contact night-coverage at [email or web address], password [facility] 01/16/2016, 3:33 PM  LOS: 2 days

## 2016-01-16 NOTE — Progress Notes (Signed)
Department of Radiation Oncology  Phone:  [phone] Fax:        [phone]  Weekly Treatment Note    Name: Antonio Hancock Date: 01/16/2016 MRN: [id number] DOB: Apr 06, 1941   Diagnosis:     ICD-9-CM ICD-10-CM   1. Malignant neoplasm of head of pancreas ([place]) 157.0 C25.0      Current dose: 46.8 Gy  Current fraction: 26   MEDICATIONS: No current facility-administered medications for this encounter.   No current outpatient prescriptions on file.   Facility-Administered Medications Ordered in Other Encounters  Medication Dose Route Frequency Provider Last Rate Last Dose  . acetaminophen (TYLENOL) tablet 650 mg  650 mg Oral Q6H PRN Toy Baker, MD       Or  . acetaminophen (TYLENOL) suppository 650 mg  650 mg Rectal Q6H PRN Toy Baker, MD      . ALPRAZolam Duanne Moron) tablet 0.25 mg  0.25 mg Oral QHS Toy Baker, MD   0.25 mg at 01/15/16 2243  . apixaban (ELIQUIS) tablet 5 mg  5 mg Oral BID Eugenie Filler, MD   5 mg at 01/16/16 1121  . arformoterol (BROVANA) nebulizer solution 15 mcg  15 mcg Nebulization BID Eugenie Filler, MD   15 mcg at 01/16/16 1046  . atorvastatin (LIPITOR) tablet 10 mg  10 mg Oral Daily Toy Baker, MD   10 mg at 01/16/16 1750  . budesonide (PULMICORT) nebulizer solution 0.25 mg  0.25 mg Nebulization BID Eugenie Filler, MD   0.25 mg at 01/16/16 1046  . cefTRIAXone (ROCEPHIN) 2 g in dextrose 5 % 50 mL IVPB  2 g Intravenous Q24H Eugenie Filler, MD   2 g at 01/16/16 1508  . dextrose 5 %-0.9 % sodium chloride infusion   Intravenous Continuous Eugenie Filler, MD 75 mL/hr at 01/16/16 1539    . fluticasone (FLONASE) 50 MCG/ACT nasal spray 2 spray  2 spray Each Nare Daily Eugenie Filler, MD   2 spray at 01/16/16 1715  . insulin aspart (novoLOG) injection 0-9 Units  0-9 Units Subcutaneous 6 times per day Toy Baker, MD   1 Units at 01/14/16 2207  . lipase/protease/amylase (CREON) capsule 24,000 Units  24,000  Units Oral TID WC Eugenie Filler, MD   24,000 Units at 01/16/16 1750  . loperamide (IMODIUM) capsule 2 mg  2 mg Oral PRN Nat Math, MD   2 mg at 01/13/16 2043  . loratadine (CLARITIN) tablet 10 mg  10 mg Oral Daily Eugenie Filler, MD   10 mg at 01/16/16 1702  . morphine (MS CONTIN) 12 hr tablet 30 mg  30 mg Oral q morning - 10a Toy Baker, MD   30 mg at 01/16/16 1009  . nicotine (NICODERM CQ - dosed in mg/24 hours) patch 21 mg  21 mg Transdermal Daily Toy Baker, MD   21 mg at 01/16/16 1011  . ondansetron (ZOFRAN) tablet 4 mg  4 mg Oral Q6H PRN Toy Baker, MD   4 mg at 01/13/16 0259   Or  . ondansetron (ZOFRAN) injection 4 mg  4 mg Intravenous Q6H PRN Toy Baker, MD   4 mg at 01/14/16 1733  . oxyCODONE (Oxy IR/ROXICODONE) immediate release tablet 5 mg  5 mg Oral Q4H PRN Toy Baker, MD   5 mg at 01/15/16 2243  . pantoprazole (PROTONIX) EC tablet 40 mg  40 mg Oral Q1200 Simbiso Ranga, MD   40 mg at 01/16/16 1122  . pneumococcal 23 valent vaccine (PNU-IMMUNE) injection  0.5 mL  0.5 mL Intramuscular Prior to discharge Eugenie Filler, MD      . prochlorperazine (COMPAZINE) injection 10 mg  10 mg Intravenous Q6H PRN Eugenie Filler, MD   10 mg at 01/14/16 1852  . tiotropium (SPIRIVA) inhalation capsule 18 mcg  18 mcg Inhalation Daily Eugenie Filler, MD   18 mcg at 01/16/16 1046     ALLERGIES: Review of patient's allergies indicates no known allergies.   LABORATORY DATA:  Lab Results  Component Value Date   WBC 4.5 01/16/2016   HGB 8.7* 01/16/2016   HCT 24.9* 01/16/2016   MCV 88.3 01/16/2016   PLT 120* 01/16/2016   Lab Results  Component Value Date   NA 133* 01/16/2016   K 3.1* 01/16/2016   CL 110 01/16/2016   CO2 16* 01/16/2016   Lab Results  Component Value Date   ALT 15* 01/16/2016   AST 28 01/16/2016   ALKPHOS 73 01/16/2016   BILITOT 1.1 01/16/2016     NARRATIVE: Antonio Hancock was seen today for weekly treatment  management. The chart was checked and the patient's films were reviewed.  Weekly rad txs pancreas 26/28 completed, in patient  Had parcentesis yesterday  And 3.7 liters fluid removed, patient is feeling better, no pain or nausea at present time, fatigued and weak, ate grilled chees sandwich for lunch and that has stayed down,  6:38 PM BP 103/57 mmHg  Pulse 75  Temp(Src) 97.9 F (36.6 C) (Oral)  Resp 20  SpO2 97%  Wt Readings from Last 3 Encounters:  01/12/16 147 lb (66.679 kg)  01/09/16 147 lb 4.8 oz (66.815 kg)  01/08/16 145 lb 1.6 oz (65.817 kg)    PHYSICAL EXAMINATION: oral temperature is 97.9 F (36.6 C). His blood pressure is 103/57 and his pulse is 75. His respiration is 20 and oxygen saturation is 97%.        ASSESSMENT: The patient is doing satisfactorily with treatment.  PLAN: We will continue with the patient's radiation treatment as planned. The patient feels better after his paracentesis. We will complete his treatment early next week after 2 more fractions.

## 2016-01-16 NOTE — Progress Notes (Signed)
  Radiation Oncology         [phone] ________________________________  Name: Antonio Hancock MRN: [id number]  Date: 12/24/2015  DOB: 15-Apr-1941  SIMULATION AND TREATMENT PLANNING NOTE  DIAGNOSIS:   No diagnosis found.   Site:  Abdomen  NARRATIVE:  The patient has undergone a significant change in his anatomy and therefore it is necessary for the patient to undergo another simulation to alter his treatment plan to account for this change. A current CT scan has been obtained and this will be used for planning purposes.   The CT images were loaded into the planning software.  Then the target and avoidance structures were contoured.  Treatment planning then occurred.  The radiation prescription was entered and confirmed.  A total of 6 complex treatment devices were fabricated which relate to the designed radiation treatment fields. Each of these customized fields/ complex treatment devices will be used on a daily basis during the radiation course. I have requested : Isodose Plan.   PLAN:  The patient will receive 45 Gy in 25 fractions initially.  ________________________________   Jodelle Gross, MD, PhD

## 2016-01-16 NOTE — Progress Notes (Signed)
  Radiation Oncology         [phone]) [phone] ________________________________  Name: Antonio Hancock MRN: [id number]  Date: 01/12/2016  DOB: Jan 10, 1941  COMPLEX SIMULATION  NOTE  Diagnosis: pancreatic cancer  Narrative The patient has initially been planned to receive a course of radiation treatment to a dose of 45 gray in 25 fractions at 1.8 gray per fraction. The patient will now receive a boost to the high risk target volume for an additional 5.4 gray. This will be delivered in 3 fractions at 1.8 gray per fraction and a cone down boost technique will be utilized. To accomplish this, an additional 5 customized blocks have been designed for this purpose. A complex isodose plan is requested to ensure that the high-risk target region receives the appropriate radiation dose and that the nearby normal structures continue to be appropriately spared. The patient's final total dose therefore will be 50.4 gray.   ________________________________ ------------------------------------------------  Jodelle Gross, MD, PhD

## 2016-01-16 NOTE — Progress Notes (Signed)
OT Cancellation Note  Patient Details Name: Antonio Hancock MRN: [id number] DOB: Oct 25, 1941   Cancelled Treatment:    Reason Eval/Treat Not Completed: Other (comment).  Pt at XRT. Will likely check back over the weekend.  [name] 01/16/2016, 1:36 PM  Lesle Chris, OTR/L [phone] 01/16/2016

## 2016-01-16 NOTE — Progress Notes (Signed)
IP PROGRESS NOTE  Subjective:   75 year old gentleman with locally advanced pancreatic cancer currently receiving radiation therapy with Xeloda. He was hospitalized on 01/12/2016 with diarrhea, hypotension and failure to thrive.  Since hospitalization, he has reported some improvement in his symptoms but have continued to have episodic diarrhea and poor by mouth intake. He was started on pancreatic enzymes which might have helped his symptoms.  This morning, he feels fairly well at this time without any major complaints. He does not report any abdominal pain or discomfort. Has not reported any nausea or vomiting.  Objective:  Vital signs in last 24 hours: Temp:  [98.1 F (36.7 C)-98.3 F (36.8 C)] 98.3 F (36.8 C) (02/10 0752) Pulse Rate:  [62-70] 62 (02/10 0752) Resp:  [16-18] 18 (02/10 0752) BP: (102-119)/(54-67) 103/55 mmHg (02/10 0752) SpO2:  [98 %-100 %] 100 % (02/10 0752) Weight change:  Last BM Date: 01/14/16  Intake/Output from previous day: 02/09 0701 - 02/10 0700 In: 2018 [P.O.:240; I.V.:1778] Out: -  Chronically ill-appearing gentleman without distress. Mouth: mucous membranes moist, pharynx normal without lesions Resp: clear to auscultation bilaterally Cardio: regular rate and rhythm, S1, S2 normal, no murmur, click, rub or gallop GI: soft, non-tender; bowel sounds normal; no masses,  no organomegaly Extremities: extremities normal, atraumatic, no cyanosis or edema    Lab Results:  Recent Labs  01/15/16 0547 01/16/16 0537  WBC 1.2* 4.5  HGB 8.0* 8.7*  HCT 22.8* 24.9*  PLT 116* 120*    BMET  Recent Labs  01/15/16 0547 01/16/16 0537  NA 132* 133*  K 3.4* 3.1*  CL 111 110  CO2 16* 16*  GLUCOSE 86 93  BUN 11 8  CREATININE 1.32* 1.27*  CALCIUM 7.5* 7.5*    Studies/Results: US Paracentesis  01/15/2016  INDICATION: History of pancreatic cancer with new onset ascites. Request has been made for diagnostic and therapeutic paracentesis EXAM:  ULTRASOUND GUIDED right upper quadrant PARACENTESIS MEDICATIONS: 1% lidocaine COMPLICATIONS: None immediate. PROCEDURE: Informed written consent was obtained from the patient after a discussion of the risks, benefits and alternatives to treatment. A timeout was performed prior to the initiation of the procedure. Initial ultrasound scanning demonstrates a moderate amount of ascites within the right upper abdominal quadrant. The right upper abdomen was prepped and draped in the usual sterile fashion. 1% lidocaine was used for local anesthesia. Following this, a Safe-T-Centesis catheter was introduced. An ultrasound image was saved for documentation purposes. The paracentesis was performed. The catheter was removed and a dressing was applied. The patient tolerated the procedure well without immediate post procedural complication. FINDINGS: A total of approximately 3.7 L of clear yellow fluid was removed. Samples were sent to the laboratory as requested by the clinical team. IMPRESSION: Successful ultrasound-guided paracentesis yielding 3.7 liters of peritoneal fluid. Read by: Saverio Danker, PA-C Electronically Signed   By: Lucrezia Europe M.D.   On: 01/15/2016 14:47   IMPRESSION: 1. Increasing moderate volume ascites within the abdomen and pelvis. 2. Apparent diffuse wall thickening and mucosal edema along the ascending colon, which may reflect mild infectious or inflammatory colitis, depending on the patient's symptoms. Alternatively, this may be related to the patient's ascites. 3. Trace bilateral pleural effusions, with bibasilar airspace opacity, which may reflect atelectasis or pneumonia. 4. Biliary duct stent is noted in unchanged position, with a large amount of pneumobilia noted within the liver. 5. Known pancreatic head mass difficult to fully assess without contrast. 6. 3.5 cm hyperdense mass along the lateral aspect of the  right kidney has mildly increased in size and may reflect a complex  renal cyst for malignancy, as previously described. 7. Diffuse calcification along the abdominal aorta and its branches. 8. 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 Diffuse calcification along the abdominal aorta and its branches. Recommend followup by ultrasound in 3 years. This recommendation follows ACR consensus guidelines: White Paper of the ACR Incidental Findings Committee II on Vascular Findings. J Am Coll Radiol 2013; 10:789-794. 9. Scattered diverticulosis along the descending and sigmoid colon, without evidence of diverticulitis. 10. Enlarged prostate noted.    Medications: I have reviewed the patient's current medications.  Assessment/Plan:  75 year old gentleman with the following issues:  1. Locally advanced pancreatic cancer. Currently receiving radiation therapy was oral Xeloda and therapy is on hold for the time being. This will be resumed once his other health issues have resolved.  2. Ascites: Unclear etiology but could be worrisome for metastatic disease and peritoneal involvement. I agree with paracentesis and I would send fluid for cytology in the future if this needs to be repeated.  3. Diarrhea: Multifactorial could be related to Xeloda versus pancreatic insufficiency. I will continue to hold Xeloda and I agree with pancreatic enzyme replacement.  4. Neutropenia: This have resolved at this time his white cell count is back to normal. No need for any growth factor support at this time.  5. Disposition: I agree with the current management and would recommend continue supportive care as you are doing. Once ready for discharge we'll arrange for follow-up at the [facility].   LOS: 2 days   Earla Charlie 01/16/2016, 9:22 AM

## 2016-01-17 LAB — CBC WITH DIFFERENTIAL/PLATELET
BASOS PCT: 0 %
Basophils Absolute: 0 10*3/uL (ref 0.0–0.1)
Eosinophils Absolute: 0.4 10*3/uL (ref 0.0–0.7)
Eosinophils Relative: 5 %
HEMATOCRIT: 25.6 % — AB (ref 39.0–52.0)
Hemoglobin: 9 g/dL — ABNORMAL LOW (ref 13.0–17.0)
LYMPHS ABS: 0.3 10*3/uL — AB (ref 0.7–4.0)
LYMPHS PCT: 4 %
MCH: 31.3 pg (ref 26.0–34.0)
MCHC: 35.2 g/dL (ref 30.0–36.0)
MCV: 88.9 fL (ref 78.0–100.0)
MONO ABS: 0.4 10*3/uL (ref 0.1–1.0)
MONOS PCT: 5 %
NEUTROS ABS: 6.7 10*3/uL (ref 1.7–7.7)
Neutrophils Relative %: 87 %
Platelets: 119 10*3/uL — ABNORMAL LOW (ref 150–400)
RBC: 2.88 MIL/uL — ABNORMAL LOW (ref 4.22–5.81)
RDW: 17.8 % — AB (ref 11.5–15.5)
WBC: 7.8 10*3/uL (ref 4.0–10.5)

## 2016-01-17 LAB — GLUCOSE, CAPILLARY
GLUCOSE-CAPILLARY: 102 mg/dL — AB (ref 65–99)
GLUCOSE-CAPILLARY: 82 mg/dL (ref 65–99)
GLUCOSE-CAPILLARY: 91 mg/dL (ref 65–99)
Glucose-Capillary: 89 mg/dL (ref 65–99)
Glucose-Capillary: 90 mg/dL (ref 65–99)
Glucose-Capillary: 92 mg/dL (ref 65–99)

## 2016-01-17 LAB — BASIC METABOLIC PANEL
Anion gap: 4 — ABNORMAL LOW (ref 5–15)
BUN: 7 mg/dL (ref 6–20)
CALCIUM: 7.6 mg/dL — AB (ref 8.9–10.3)
CHLORIDE: 113 mmol/L — AB (ref 101–111)
CO2: 17 mmol/L — AB (ref 22–32)
CREATININE: 1.23 mg/dL (ref 0.61–1.24)
GFR calc Af Amer: >60 mL/min (ref >60)
GFR calc non Af Amer: 56 mL/min — ABNORMAL LOW (ref >60)
GLUCOSE: 96 mg/dL (ref 65–99)
Potassium: 3.9 mmol/L (ref 3.5–5.1)
Sodium: 134 mmol/L — ABNORMAL LOW (ref 135–145)

## 2016-01-17 LAB — MAGNESIUM: Magnesium: 1.9 mg/dL (ref 1.7–2.4)

## 2016-01-17 MED ORDER — STERILE WATER FOR INJECTION IV SOLN
INTRAVENOUS | Status: AC
  Administered 2016-01-17 – 2016-01-18 (×2): via INTRAVENOUS
  Filled 2016-01-17 (×4): qty 850

## 2016-01-17 NOTE — Progress Notes (Signed)
Notified by physician that patient telemetry looks unusual and ask pt if he is having chest pain or feeling different.  Patient denies chest pain and denies feeling different.  Patient is laying in bed and talking with his wife at bedside at this time.  Physician orders 12 lead EKG.  EKG shows Right bundle branch block.  Physician notified of EKG results.

## 2016-01-17 NOTE — Progress Notes (Signed)
OT Cancellation Note  Patient Details Name: Antonio Hancock MRN: [id number] DOB: Sep 03, 1941   Cancelled Treatment:    Reason Eval/Treat Not Completed: Other (comment) (Pt asleep upon therapist arrival. Per niece pt "has been ill (upset) today and he just fell asleep." Niece requesting therapist to come back another time. OT to reattempt as schedule permits. Will likely be Sunday. )  Hortencia Pilar 01/17/2016, 2:43 PM

## 2016-01-17 NOTE — Progress Notes (Signed)
[facility] PROGRESS NOTE  Antonio Hancock [id number] DOB: [birth year] DOA: 01/12/2016 PCP: Woody Seller, MD  Brief interval history Antonio Hancock is a 75 y.o. male has a past medical history of Hypertension; Diabetes mellitus; Mobitz type 2 second degree AV block; Tobacco abuse; Pacemaker (2012); Chronotropic incompetence with sinus node dysfunction ([place]) (2012); Hyperlipidemia; HOH (hard of hearing); and Anxiety. Presented with chills while undergoing fluids infusion at cancer center. Patient has been dealing with diarrhea side effects of radiation therapy for his pancreatic cancer and has required repeated visits to oncology Center for fluids. Begin controlling his diarrhea with Imodium and was given a prescription for Lomotil. He's been having some nausea and vomiting. Started to have chills tonight his vital signs were checked while at [place] he was noted to be tachycardic and hypotensive while being on his second liter of normal saline at the clinic. Deneis any significant cough, no chest pain, no sick contacts IN ER: Patient developed fever while in emergency department up to 102.5 heart rate as high as 103 respirations up to 24. Blood pressure going systolics going down to mid 80s lactic acid unremarkable 2. UA and chest x-ray both did not show any evidence of infection. Influenza PCR was ordered Regarding pertinent past history: Patient has history of locally advanced pancreatic cancer diagnosed in November 2016 currently undergoing weekly radiation therapy status post ERCP and stent placement for biliary obstruction. currently on Xeloda. Patient has history of atrial fibrillation anticoagulated with Eliquis. He sp pacemaker due to symptomatic bradycardia. Patient used to have history of hypertension but had to discontinue all his blood pressure medications in the past secondary to recurrent hypotension in a setting of dehydration. Also had history of diabetes but given  massive weight loss no longer needs any medications Hospitalist was called for admission for persistent nausea vomiting diarrhea, resulting in dehydration with evidence of SIRS  Assessment/Plan: #1 sepsis Patient presenting with chills, fever with a temperature 102.5, tachycardia, tachypnea and hypotension with systolic blood pressure in the 80s. Patient with complaints of worsening multiple loose stools. Chest x-ray done was negative for any acute infiltrate. Patient has been pancultured results are pending. C. difficile PCR is negative. Stool GI pathogen panel is negative. Urine cultures with 50,000 colonies of Klebsiella pneumonia which is sensitive to ampicillin sulbactam, cephalosporins, fluoroquinolones, Bactrim. CT abdomen and pelvis with trace bilateral effusions with bibasilar airspace opacity which may reflect atelectasis or pneumonia. Patient currently afebrile. Some clinical improvement. Discontinued IV vancomycin and IV Zosyn. IV azithromycin has been discontinued. Continue IV Rocephin.  #2 diarrhea Questionable etiology. 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 Continue Imodium as needed. Continue pancreatic enzyme/creon continue supportive care. Follow.   #3 probable Klebsiella pneumonia urinary tract infection Urine cultures with 50,000 Klebsiella pneumonia. Continue IV Rocephin. Follow.  #4 probable pneumonia Patient has been pancultured and cultures are pending. Continue nebulizers. Discontinued IV vancomycin and IV Zosyn. Continue IV Rocephin. Patient is status post 3 days of azithromycin. Supportive care.   #5 pancytopenia Likely chemotherapy-induced. Patient recently on xeloda. Xeloda on hold. Status post 1 dose granix. ANC at 6.7. Supportive care. Oncology ff.  #6 pancreatic cancer Patient receiving radiation treatment. Xeloda on hold. Will likely need  outpatient follow-up.  #7  dehydration IV fluids.  #8 chronic diastolic CHF Patient appears dehydrated. Patient on IV fluids. Monitor closely for volume overload.  #9 ascites Patient with noted ascites on exam and on CT scan.  Status post ultrasound-guided abdominal paracentesis with 3.7 L of fluid removed. Fluid studies pending. Cytology with atypical cells. Continue empiric IV antibiotics. Follow.  #10 hypertension Continue to hold antihypertensive medications as patient blood pressure is borderline.  #11 status post permanent pacemaker  #12 atrial fibrillation Currently rate controlled. Resumed anticoagulation.  #13 protein calorie malnutrition Nutritional supplementation.  #14 prophylaxis PPI for GI prophylaxis. SCDs/Eliquis for DVT prophylaxis.   Code Status: DO NOT RESUSCITATE Family Communication: Updated patient, wife, sister at bedside. Disposition Plan: Home when medically stable with improvement in diarrhea and ability to tolerate oral intake and sepsis workup is complete.   Consultants:  Interventional radiology  Oncology: Dr. Alen Blew 01/16/2016  Procedures:  CT abdomen and pelvis 01/13/2016  chest x-ray 01/12/2016  Paracentesis 01/15/2016  Antibiotics:  IV azithromycin 01/13/2016>>>> 01/16/2016  IV Zosyn 01/12/2016>>>>> 01/16/2016  IV vancomycin 01/12/2016>>>>> 01/16/2016  IV Rocephin 01/16/2016  HPI/Subjective: Patient c/o weakness and not feeling well. Patient denies any chest pain. No shortness of breath. Poor oral intake today. Patient still with diarrhea.  Objective: Filed Vitals:   01/17/16 1402 01/17/16 1404  BP: 97/63 97/50  Pulse: 74 87  Temp:    Resp:      Intake/Output Summary (Last 24 hours) at 01/17/16 1424 Last data filed at 01/16/16 1850  Gross per 24 hour  Intake    240 ml  Output      0 ml  Net    240 ml   Filed Weights   01/12/16 2257  Weight: 66.679 kg (147 lb)    Exam:   General:  NAD.  CACHETIC  Cardiovascular: RRR  Respiratory: Clear to auscultation bilaterally.  Abdomen: Soft,  Non distended, nontender to palpation, positive bowel sounds  Musculoskeletal: No clubbing cyanosis or edema.  Data Reviewed: Basic Metabolic Panel:  Recent Labs Lab 01/13/16 0155 01/14/16 0631 01/15/16 0547 01/16/16 0537 01/17/16 0554  NA 125* 130* 132* 133* 134*  K 4.0 3.2* 3.4* 3.1* 3.9  CL 102 106 111 110 113*  CO2 15* 17* 16* 16* 17*  GLUCOSE 101* 84 86 93 96  BUN 18 15 11 8 7   CREATININE 1.52* 1.38* 1.32* 1.27* 1.23  CALCIUM 7.4* 7.6* 7.5* 7.5* 7.6*  MG 1.3* 1.7 2.0 1.6* 1.9  PHOS 2.7  --   --   --   --    Liver Function Tests:  Recent Labs Lab 01/12/16 1824 01/13/16 0155 01/14/16 0631 01/15/16 0547 01/16/16 0537  AST 36 40 28 24 28   ALT 19 15* 13* 14* 15*  ALKPHOS 118 101 81 75 73  BILITOT 1.4* 1.2 1.0 1.1 1.1  PROT 6.4* 5.8* 4.8* 4.6* 4.7*  ALBUMIN 2.9* 2.5* 2.0* 1.9* 1.9*   No results for input(s): LIPASE, AMYLASE in the last 168 hours. No results for input(s): AMMONIA in the last 168 hours. CBC:  Recent Labs Lab 01/12/16 1824 01/13/16 0155 01/14/16 0631 01/15/16 0547 01/16/16 0537 01/17/16 0554  WBC 3.5* 4.1 1.6* 1.2* 4.5 7.8  NEUTROABS 3.0  --  1.2* 0.8* 3.8 6.7  HGB 10.6* 9.2* 8.3* 8.0* 8.7* 9.0*  HCT 30.3* 25.8* 23.5* 22.8* 24.9* 25.6*  MCV 88.3 87.5 88.0 88.4 88.3 88.9  PLT 170 138* 109* 116* 120* 119*   Cardiac Enzymes: No results for input(s): CKTOTAL, CKMB, CKMBINDEX, TROPONINI in the last 168 hours. BNP (last 3 results) No results for input(s): BNP in the last 8760 hours.  ProBNP (last 3 results) No results for input(s): PROBNP in the last 8760 hours.  CBG:  Recent Labs Lab 01/16/16 2007 01/16/16 2303 01/17/16 0403 01/17/16 0741 01/17/16 1155  GLUCAP 112* 106* 89 90 92    Recent Results (from the past 240 hour(s))  Urine culture     Status: None   Collection Time: 01/12/16  8:31 PM  Result Value Ref Range Status    Specimen Description URINE, CLEAN CATCH  Final   Special Requests NONE  Final   Culture   Final    50,000 COLONIES/mL KLEBSIELLA PNEUMONIAE Performed at [facility]    Report Status 01/15/2016 FINAL  Final   Organism ID, Bacteria KLEBSIELLA PNEUMONIAE  Final      Susceptibility   Klebsiella pneumoniae - MIC*    AMPICILLIN >=32 RESISTANT Resistant     CEFAZOLIN <=4 SENSITIVE Sensitive     CEFTRIAXONE <=1 SENSITIVE Sensitive     CIPROFLOXACIN <=0.25 SENSITIVE Sensitive     GENTAMICIN <=1 SENSITIVE Sensitive     IMIPENEM <=0.25 SENSITIVE Sensitive     NITROFURANTOIN 64 INTERMEDIATE Intermediate     TRIMETH/SULFA <=20 SENSITIVE Sensitive     AMPICILLIN/SULBACTAM 4 SENSITIVE Sensitive     PIP/TAZO <=4 SENSITIVE Sensitive     * 50,000 COLONIES/mL KLEBSIELLA PNEUMONIAE  Blood Culture (routine x 2)     Status: None (Preliminary result)   Collection Time: 01/13/16 12:16 AM  Result Value Ref Range Status   Specimen Description BLOOD LEFT ANTECUBITAL  Final   Special Requests IN PEDIATRIC BOTTLE 1CC  Final   Culture   Final    NO GROWTH 4 DAYS Performed at [facility]    Report Status PENDING  Incomplete  Blood Culture (routine x 2)     Status: None (Preliminary result)   Collection Time: 01/13/16 12:16 AM  Result Value Ref Range Status   Specimen Description BLOOD LEFT WRIST  Final   Special Requests BOTTLES DRAWN AEROBIC AND ANAEROBIC 5CC  Final   Culture   Final    NO GROWTH 4 DAYS Performed at [facility]    Report Status PENDING  Incomplete  Gastrointestinal Panel by PCR , Stool     Status: None   Collection Time: 01/13/16  1:23 AM  Result Value Ref Range Status   Campylobacter species NOT DETECTED NOT DETECTED Final   Plesimonas shigelloides NOT DETECTED NOT DETECTED Final   Salmonella species NOT DETECTED NOT DETECTED Final   Yersinia enterocolitica NOT DETECTED NOT DETECTED Final   Vibrio species NOT DETECTED NOT DETECTED Final   Vibrio cholerae  NOT DETECTED NOT DETECTED Final   Enteroaggregative E coli (EAEC) NOT DETECTED NOT DETECTED Final   Enteropathogenic E coli (EPEC) NOT DETECTED NOT DETECTED Final   Enterotoxigenic E coli (ETEC) NOT DETECTED NOT DETECTED Final   Shiga like toxin producing E coli (STEC) NOT DETECTED NOT DETECTED Final   E. coli O157 NOT DETECTED NOT DETECTED Final   Shigella/Enteroinvasive E coli (EIEC) NOT DETECTED NOT DETECTED Final   Cryptosporidium NOT DETECTED NOT DETECTED Final   Cyclospora cayetanensis NOT DETECTED NOT DETECTED Final   Entamoeba histolytica NOT DETECTED NOT DETECTED Final   Giardia lamblia NOT DETECTED NOT DETECTED Final   Adenovirus F40/41 NOT DETECTED NOT DETECTED Final   Astrovirus NOT DETECTED NOT DETECTED Final   Norovirus GI/GII NOT DETECTED NOT DETECTED Final   Rotavirus A NOT DETECTED NOT DETECTED Final   Sapovirus (I, II, IV, and V) NOT DETECTED NOT DETECTED Final  C difficile quick scan w PCR reflex     Status: None  Collection Time: 01/13/16  1:23 AM  Result Value Ref Range Status   C Diff antigen NEGATIVE NEGATIVE Final   C Diff toxin NEGATIVE NEGATIVE Final   C Diff interpretation Negative for toxigenic C. difficile  Final  Anaerobic culture     Status: None (Preliminary result)   Collection Time: 01/15/16  2:20 PM  Result Value Ref Range Status   Specimen Description PERITONEAL  Final   Special Requests NONE  Final   Gram Stain   Final    NO WBC SEEN NO SQUAMOUS EPITHELIAL CELLS SEEN NO ORGANISMS SEEN Performed at [facility]    Culture   Final    NO ANAEROBES ISOLATED; CULTURE IN PROGRESS FOR 5 DAYS Performed at [facility]    Report Status PENDING  Incomplete  Culture, body fluid-bottle     Status: None (Preliminary result)   Collection Time: 01/15/16  2:20 PM  Result Value Ref Range Status   Specimen Description PERITONEAL  Final   Special Requests BOTTLES DRAWN AEROBIC AND ANAEROBIC 10MLS  Final   Culture   Final    NO GROWTH 2  DAYS Performed at [facility]    Report Status PENDING  Incomplete  Gram stain     Status: None   Collection Time: 01/15/16  2:20 PM  Result Value Ref Range Status   Specimen Description FLUID PERITONEAL  Final   Special Requests NONE  Final   Gram Stain   Final    FEW WBC PRESENT, PREDOMINANTLY MONONUCLEAR NO ORGANISMS SEEN Performed at [facility]    Report Status 01/15/2016 FINAL  Final     Studies: US Paracentesis  01/15/2016  INDICATION: History of pancreatic cancer with new onset ascites. Request has been made for diagnostic and therapeutic paracentesis EXAM: ULTRASOUND GUIDED right upper quadrant PARACENTESIS MEDICATIONS: 1% lidocaine COMPLICATIONS: None immediate. PROCEDURE: Informed written consent was obtained from the patient after a discussion of the risks, benefits and alternatives to treatment. A timeout was performed prior to the initiation of the procedure. Initial ultrasound scanning demonstrates a moderate amount of ascites within the right upper abdominal quadrant. The right upper abdomen was prepped and draped in the usual sterile fashion. 1% lidocaine was used for local anesthesia. Following this, a Safe-T-Centesis catheter was introduced. An ultrasound image was saved for documentation purposes. The paracentesis was performed. The catheter was removed and a dressing was applied. The patient tolerated the procedure well without immediate post procedural complication. FINDINGS: A total of approximately 3.7 L of clear yellow fluid was removed. Samples were sent to the laboratory as requested by the clinical team. IMPRESSION: Successful ultrasound-guided paracentesis yielding 3.7 liters of peritoneal fluid. Read by: Saverio Danker, PA-C Electronically Signed   By: Lucrezia Europe M.D.   On: 01/15/2016 14:47    Scheduled Meds: . ALPRAZolam  0.25 mg Oral QHS  . apixaban  5 mg Oral BID  . arformoterol  15 mcg Nebulization BID  . atorvastatin  10 mg Oral Daily  .  budesonide (PULMICORT) nebulizer solution  0.25 mg Nebulization BID  . cefTRIAXone (ROCEPHIN)  IV  2 g Intravenous Q24H  . fluticasone  2 spray Each Nare Daily  . insulin aspart  0-9 Units Subcutaneous 6 times per day  . lipase/protease/amylase  24,000 Units Oral TID WC  . loratadine  10 mg Oral Daily  . morphine  30 mg Oral q morning - 10a  . nicotine  21 mg Transdermal Daily  . pantoprazole  40  mg Oral Q1200  . tiotropium  18 mcg Inhalation Daily   Continuous Infusions: .  sodium bicarbonate 150 mEq in sterile water 1000 mL infusion 75 mL/hr at 01/17/16 1047    Principal Problem:   Severe sepsis ([place]) Active Problems:   Hypertension   Diabetes mellitus ([place])   Tobacco abuse   Pacemaker-[employer]   Atrial fibrillation (HCC)   Obstructive sleep apnea   Hyponatremia   Malignant neoplasm of head of pancreas (HCC)   Dehydration   Chronic diastolic CHF (congestive heart failure), NYHA class 1 (HCC)   CKD (chronic kidney disease) stage 3, GFR 30-59 ml/min   Hypomagnesemia   Ascites   Pneumonia   Colitis   Protein-calorie malnutrition, severe   Neonatal sepsis due to group B Streptococcus (HCC)   Antineoplastic chemotherapy induced pancytopenia (HCC)   Acute cystitis without hematuria   Hypokalemia    Time spent: 40 minutes    Donni Oglesby M.D. [facility] Pager [phone]. If 7PM-7AM, please contact night-coverage at [email or web address], password [facility] 01/17/2016, 2:24 PM  LOS: 3 days

## 2016-01-17 NOTE — Progress Notes (Signed)
Notified physician that orthostatic vital signs completed.

## 2016-01-18 LAB — CBC
HCT: 25.2 % — ABNORMAL LOW (ref 39.0–52.0)
HEMOGLOBIN: 8.9 g/dL — AB (ref 13.0–17.0)
MCH: 31.3 pg (ref 26.0–34.0)
MCHC: 35.3 g/dL (ref 30.0–36.0)
MCV: 88.7 fL (ref 78.0–100.0)
Platelets: 126 10*3/uL — ABNORMAL LOW (ref 150–400)
RBC: 2.84 MIL/uL — AB (ref 4.22–5.81)
RDW: 17.8 % — ABNORMAL HIGH (ref 11.5–15.5)
WBC: 6.8 10*3/uL (ref 4.0–10.5)

## 2016-01-18 LAB — BASIC METABOLIC PANEL
ANION GAP: 5 (ref 5–15)
BUN: 7 mg/dL (ref 6–20)
CALCIUM: 7.7 mg/dL — AB (ref 8.9–10.3)
CO2: 20 mmol/L — AB (ref 22–32)
Chloride: 111 mmol/L (ref 101–111)
Creatinine, Ser: 1.15 mg/dL (ref 0.61–1.24)
Glucose, Bld: 84 mg/dL (ref 65–99)
POTASSIUM: 3.8 mmol/L (ref 3.5–5.1)
Sodium: 136 mmol/L (ref 135–145)

## 2016-01-18 LAB — CULTURE, BLOOD (ROUTINE X 2)
CULTURE: NO GROWTH
Culture: NO GROWTH

## 2016-01-18 LAB — GLUCOSE, CAPILLARY
GLUCOSE-CAPILLARY: 73 mg/dL (ref 65–99)
GLUCOSE-CAPILLARY: 93 mg/dL (ref 65–99)
Glucose-Capillary: 73 mg/dL (ref 65–99)
Glucose-Capillary: 73 mg/dL (ref 65–99)
Glucose-Capillary: 83 mg/dL (ref 65–99)

## 2016-01-18 LAB — MAGNESIUM: MAGNESIUM: 1.7 mg/dL (ref 1.7–2.4)

## 2016-01-18 MED ORDER — LOPERAMIDE HCL 2 MG PO CAPS
4.0000 mg | ORAL_CAPSULE | Freq: Once | ORAL | Status: AC
  Administered 2016-01-18: 4 mg via ORAL
  Filled 2016-01-18: qty 2

## 2016-01-18 MED ORDER — PANCRELIPASE (LIP-PROT-AMYL) 36000-114000 UNITS PO CPEP
36000.0000 [IU] | ORAL_CAPSULE | Freq: Three times a day (TID) | ORAL | Status: DC
  Administered 2016-01-18 – 2016-01-21 (×8): 36000 [IU] via ORAL
  Filled 2016-01-18 (×11): qty 1

## 2016-01-18 MED ORDER — MAGNESIUM SULFATE 50 % IJ SOLN
3.0000 g | Freq: Once | INTRAVENOUS | Status: AC
  Administered 2016-01-18: 3 g via INTRAVENOUS
  Filled 2016-01-18: qty 6

## 2016-01-18 NOTE — Progress Notes (Signed)
[facility] PROGRESS NOTE  Janthony Behar [id number] DOB: 1941-03-23 DOA: 01/12/2016 PCP: Woody Seller, MD  Brief interval history Antonio Hancock is a 75 y.o. male has a past medical history of Hypertension; Diabetes mellitus; Mobitz type 2 second degree AV block; Tobacco abuse; Pacemaker (2012); Chronotropic incompetence with sinus node dysfunction ([place]) (2012); Hyperlipidemia; HOH (hard of hearing); and Anxiety. Presented with chills while undergoing fluids infusion at cancer center. Patient has been dealing with diarrhea side effects of radiation therapy for his pancreatic cancer and has required repeated visits to oncology Center for fluids. Begin controlling his diarrhea with Imodium and was given a prescription for Lomotil. He's been having some nausea and vomiting. Started to have chills tonight his vital signs were checked while at [place] he was noted to be tachycardic and hypotensive while being on his second liter of normal saline at the clinic. Deneis any significant cough, no chest pain, no sick contacts IN ER: Patient developed fever while in emergency department up to 102.5 heart rate as high as 103 respirations up to 24. Blood pressure going systolics going down to mid 80s lactic acid unremarkable 2. UA and chest x-ray both did not show any evidence of infection. Influenza PCR was ordered Regarding pertinent past history: Patient has history of locally advanced pancreatic cancer diagnosed in November 2016 currently undergoing weekly radiation therapy status post ERCP and stent placement for biliary obstruction. currently on Xeloda. Patient has history of atrial fibrillation anticoagulated with Eliquis. He sp pacemaker due to symptomatic bradycardia. Patient used to have history of hypertension but had to discontinue all his blood pressure medications in the past secondary to recurrent hypotension in a setting of dehydration. Also had history of diabetes but given  massive weight loss no longer needs any medications Hospitalist was called for admission for persistent nausea vomiting diarrhea, resulting in dehydration with evidence of SIRS  Assessment/Plan: #1 sepsis Patient presenting with chills, fever with a temperature 102.5, tachycardia, tachypnea and hypotension with systolic blood pressure in the 80s. Patient with complaints of worsening multiple loose stools. Chest x-ray done was negative for any acute infiltrate. Patient has been pancultured results are pending. C. difficile PCR is negative. Stool GI pathogen panel is negative. Urine cultures with 50,000 colonies of Klebsiella pneumonia which is sensitive to ampicillin sulbactam, cephalosporins, fluoroquinolones, Bactrim. CT abdomen and pelvis with trace bilateral effusions with bibasilar airspace opacity which may reflect atelectasis or pneumonia. Patient currently afebrile. Some clinical improvement. Discontinued IV vancomycin and IV Zosyn. IV azithromycin has been discontinued. Continue IV Rocephin.  #2 diarrhea Questionable etiology. 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 Continue Imodium as needed. Increase pancreatic enzyme/creon continue supportive care. Follow.   #3 probable Klebsiella pneumonia urinary tract infection Urine cultures with 50,000 Klebsiella pneumonia. Continue IV Rocephin. Follow.  #4 probable pneumonia Patient has been pancultured and cultures are pending. Continue nebulizers. Discontinued IV vancomycin and IV Zosyn. Continue IV Rocephin. Patient is status post 3 days of azithromycin. Supportive care.   #5 pancytopenia Likely chemotherapy-induced. Patient recently on xeloda. Xeloda on hold. Status post 1 dose granix. ANC at 6.7. Supportive care. Oncology ff.  #6 pancreatic cancer Patient receiving radiation treatment. Xeloda on hold. Will likely need outpatient  follow-up.  #7  dehydration IV fluids.  #8 chronic diastolic CHF Patient appears dehydrated. Patient on IV fluids. Monitor closely for volume overload.  #9 ascites Patient with noted ascites on exam and on CT scan. Status  post ultrasound-guided abdominal paracentesis with 3.7 L of fluid removed. Fluid studies pending. Cytology with atypical cells. Continue empiric IV antibiotics. Follow.  #10 hypertension Continue to hold antihypertensive medications as patient blood pressure is borderline.  #11 status post permanent pacemaker  #12 atrial fibrillation Currently rate controlled. Resumed anticoagulation.  #13 protein calorie malnutrition Nutritional supplementation.  #14 prophylaxis PPI for GI prophylaxis. SCDs/Eliquis for DVT prophylaxis.   Code Status: DO NOT RESUSCITATE Family Communication: Updated patient, wife, sister at bedside. Disposition Plan: Home when medically stable with improvement in diarrhea and ability to tolerate oral intake and sepsis workup is complete.   Consultants:  Interventional radiology  Oncology: Dr. Alen Blew 01/16/2016  Procedures:  CT abdomen and pelvis 01/13/2016  chest x-ray 01/12/2016  Paracentesis 01/15/2016  Antibiotics:  IV azithromycin 01/13/2016>>>> 01/16/2016  IV Zosyn 01/12/2016>>>>> 01/16/2016  IV vancomycin 01/12/2016>>>>> 01/16/2016  IV Rocephin 01/16/2016  HPI/Subjective: Patient states had multiple small loose stools. No CP. Patient coughing.  Objective: Filed Vitals:   01/18/16 0725 01/18/16 1400  BP:  112/58  Pulse:  64  Temp: 97.6 F (36.4 C) 98.4 F (36.9 C)  Resp:  18    Intake/Output Summary (Last 24 hours) at 01/18/16 1437 Last data filed at 01/18/16 1300  Gross per 24 hour  Intake    780 ml  Output      0 ml  Net    780 ml   Filed Weights   01/12/16 2257  Weight: 66.679 kg (147 lb)    Exam:   General:  NAD. CACHETIC  Cardiovascular: RRR  Respiratory: Clear to auscultation  bilaterally.  Abdomen: Soft,  Non distended, nontender to palpation, positive bowel sounds  Musculoskeletal: No clubbing cyanosis or edema.  Data Reviewed: Basic Metabolic Panel:  Recent Labs Lab 01/13/16 0155 01/14/16 0631 01/15/16 0547 01/16/16 0537 01/17/16 0554 01/18/16 0619  NA 125* 130* 132* 133* 134* 136  K 4.0 3.2* 3.4* 3.1* 3.9 3.8  CL 102 106 111 110 113* 111  CO2 15* 17* 16* 16* 17* 20*  GLUCOSE 101* 84 86 93 96 84  BUN 18 15 11 8 7 7   CREATININE 1.52* 1.38* 1.32* 1.27* 1.23 1.15  CALCIUM 7.4* 7.6* 7.5* 7.5* 7.6* 7.7*  MG 1.3* 1.7 2.0 1.6* 1.9 1.7  PHOS 2.7  --   --   --   --   --    Liver Function Tests:  Recent Labs Lab 01/12/16 1824 01/13/16 0155 01/14/16 0631 01/15/16 0547 01/16/16 0537  AST 36 40 28 24 28   ALT 19 15* 13* 14* 15*  ALKPHOS 118 101 81 75 73  BILITOT 1.4* 1.2 1.0 1.1 1.1  PROT 6.4* 5.8* 4.8* 4.6* 4.7*  ALBUMIN 2.9* 2.5* 2.0* 1.9* 1.9*   No results for input(s): LIPASE, AMYLASE in the last 168 hours. No results for input(s): AMMONIA in the last 168 hours. CBC:  Recent Labs Lab 01/12/16 1824  01/14/16 0631 01/15/16 0547 01/16/16 0537 01/17/16 0554 01/18/16 0619  WBC 3.5*  < > 1.6* 1.2* 4.5 7.8 6.8  NEUTROABS 3.0  --  1.2* 0.8* 3.8 6.7  --   HGB 10.6*  < > 8.3* 8.0* 8.7* 9.0* 8.9*  HCT 30.3*  < > 23.5* 22.8* 24.9* 25.6* 25.2*  MCV 88.3  < > 88.0 88.4 88.3 88.9 88.7  PLT 170  < > 109* 116* 120* 119* 126*  < > = values in this interval not displayed. Cardiac Enzymes: No results for input(s): CKTOTAL, CKMB, CKMBINDEX, TROPONINI in the last  168 hours. BNP (last 3 results) No results for input(s): BNP in the last 8760 hours.  ProBNP (last 3 results) No results for input(s): PROBNP in the last 8760 hours.  CBG:  Recent Labs Lab 01/17/16 1954 01/17/16 2339 01/18/16 0512 01/18/16 0733 01/18/16 1146  GLUCAP 102* 82 73 73 73    Recent Results (from the past 240 hour(s))  Urine culture     Status: None   Collection  Time: 01/12/16  8:31 PM  Result Value Ref Range Status   Specimen Description URINE, CLEAN CATCH  Final   Special Requests NONE  Final   Culture   Final    50,000 COLONIES/mL KLEBSIELLA PNEUMONIAE Performed at [facility]    Report Status 01/15/2016 FINAL  Final   Organism ID, Bacteria KLEBSIELLA PNEUMONIAE  Final      Susceptibility   Klebsiella pneumoniae - MIC*    AMPICILLIN >=32 RESISTANT Resistant     CEFAZOLIN <=4 SENSITIVE Sensitive     CEFTRIAXONE <=1 SENSITIVE Sensitive     CIPROFLOXACIN <=0.25 SENSITIVE Sensitive     GENTAMICIN <=1 SENSITIVE Sensitive     IMIPENEM <=0.25 SENSITIVE Sensitive     NITROFURANTOIN 64 INTERMEDIATE Intermediate     TRIMETH/SULFA <=20 SENSITIVE Sensitive     AMPICILLIN/SULBACTAM 4 SENSITIVE Sensitive     PIP/TAZO <=4 SENSITIVE Sensitive     * 50,000 COLONIES/mL KLEBSIELLA PNEUMONIAE  Blood Culture (routine x 2)     Status: None   Collection Time: 01/13/16 12:16 AM  Result Value Ref Range Status   Specimen Description BLOOD LEFT ANTECUBITAL  Final   Special Requests IN PEDIATRIC BOTTLE 1CC  Final   Culture   Final    NO GROWTH 5 DAYS Performed at [facility]    Report Status 01/18/2016 FINAL  Final  Blood Culture (routine x 2)     Status: None   Collection Time: 01/13/16 12:16 AM  Result Value Ref Range Status   Specimen Description BLOOD LEFT WRIST  Final   Special Requests BOTTLES DRAWN AEROBIC AND ANAEROBIC 5CC  Final   Culture   Final    NO GROWTH 5 DAYS Performed at [facility]    Report Status 01/18/2016 FINAL  Final  Gastrointestinal Panel by PCR , Stool     Status: None   Collection Time: 01/13/16  1:23 AM  Result Value Ref Range Status   Campylobacter species NOT DETECTED NOT DETECTED Final   Plesimonas shigelloides NOT DETECTED NOT DETECTED Final   Salmonella species NOT DETECTED NOT DETECTED Final   Yersinia enterocolitica NOT DETECTED NOT DETECTED Final   Vibrio species NOT DETECTED NOT DETECTED  Final   Vibrio cholerae NOT DETECTED NOT DETECTED Final   Enteroaggregative E coli (EAEC) NOT DETECTED NOT DETECTED Final   Enteropathogenic E coli (EPEC) NOT DETECTED NOT DETECTED Final   Enterotoxigenic E coli (ETEC) NOT DETECTED NOT DETECTED Final   Shiga like toxin producing E coli (STEC) NOT DETECTED NOT DETECTED Final   E. coli O157 NOT DETECTED NOT DETECTED Final   Shigella/Enteroinvasive E coli (EIEC) NOT DETECTED NOT DETECTED Final   Cryptosporidium NOT DETECTED NOT DETECTED Final   Cyclospora cayetanensis NOT DETECTED NOT DETECTED Final   Entamoeba histolytica NOT DETECTED NOT DETECTED Final   Giardia lamblia NOT DETECTED NOT DETECTED Final   Adenovirus F40/41 NOT DETECTED NOT DETECTED Final   Astrovirus NOT DETECTED NOT DETECTED Final   Norovirus GI/GII NOT DETECTED NOT DETECTED Final   Rotavirus A NOT  DETECTED NOT DETECTED Final   Sapovirus (I, II, IV, and V) NOT DETECTED NOT DETECTED Final  C difficile quick scan w PCR reflex     Status: None   Collection Time: 01/13/16  1:23 AM  Result Value Ref Range Status   C Diff antigen NEGATIVE NEGATIVE Final   C Diff toxin NEGATIVE NEGATIVE Final   C Diff interpretation Negative for toxigenic C. difficile  Final  Anaerobic culture     Status: None (Preliminary result)   Collection Time: 01/15/16  2:20 PM  Result Value Ref Range Status   Specimen Description PERITONEAL  Final   Special Requests NONE  Final   Gram Stain   Final    NO WBC SEEN NO SQUAMOUS EPITHELIAL CELLS SEEN NO ORGANISMS SEEN Performed at [facility]    Culture   Final    NO ANAEROBES ISOLATED; CULTURE IN PROGRESS FOR 5 DAYS Performed at [facility]    Report Status PENDING  Incomplete  Culture, body fluid-bottle     Status: None (Preliminary result)   Collection Time: 01/15/16  2:20 PM  Result Value Ref Range Status   Specimen Description PERITONEAL  Final   Special Requests BOTTLES DRAWN AEROBIC AND ANAEROBIC 10MLS  Final   Culture    Final    NO GROWTH 3 DAYS Performed at [facility]    Report Status PENDING  Incomplete  Gram stain     Status: None   Collection Time: 01/15/16  2:20 PM  Result Value Ref Range Status   Specimen Description FLUID PERITONEAL  Final   Special Requests NONE  Final   Gram Stain   Final    FEW WBC PRESENT, PREDOMINANTLY MONONUCLEAR NO ORGANISMS SEEN Performed at [facility]    Report Status 01/15/2016 FINAL  Final     Studies: No results found.  Scheduled Meds: . ALPRAZolam  0.25 mg Oral QHS  . apixaban  5 mg Oral BID  . arformoterol  15 mcg Nebulization BID  . atorvastatin  10 mg Oral Daily  . budesonide (PULMICORT) nebulizer solution  0.25 mg Nebulization BID  . cefTRIAXone (ROCEPHIN)  IV  2 g Intravenous Q24H  . fluticasone  2 spray Each Nare Daily  . insulin aspart  0-9 Units Subcutaneous 6 times per day  . lipase/protease/amylase  36,000 Units Oral TID WC  . loperamide  4 mg Oral Once  . loratadine  10 mg Oral Daily  . morphine  30 mg Oral q morning - 10a  . nicotine  21 mg Transdermal Daily  . pantoprazole  40 mg Oral Q1200  . tiotropium  18 mcg Inhalation Daily   Continuous Infusions: .  sodium bicarbonate 150 mEq in sterile water 1000 mL infusion 75 mL/hr at 01/17/16 1047    Principal Problem:   Severe sepsis ([place]) Active Problems:   Hypertension   Diabetes mellitus ([place])   Tobacco abuse   Pacemaker-[employer]   Atrial fibrillation ([facility])   Obstructive sleep apnea   Hyponatremia   Malignant neoplasm of head of pancreas ([facility])   Dehydration   Chronic diastolic CHF (congestive heart failure), NYHA class 1 ([facility])   CKD (chronic kidney disease) stage 3, GFR 30-59 ml/min   Hypomagnesemia   Ascites   Pneumonia   Colitis   Protein-calorie malnutrition, severe   Neonatal sepsis due to group B Streptococcus (HCC)   Antineoplastic chemotherapy induced pancytopenia ([place])   Acute cystitis without hematuria   Hypokalemia    Time  spent: 84  minutes    [facility] M.D. [facility] Pager [phone]. If 7PM-7AM, please contact night-coverage at [email or web address], password [facility] 01/18/2016, 2:37 PM  LOS: 4 days

## 2016-01-19 ENCOUNTER — Ambulatory Visit
Admission: RE | Admit: 2016-01-19 | Discharge: 2016-01-19 | Disposition: A | Discharge status: Home / Self Care | Payer: BLUE CROSS/BLUE SHIELD | Source: Ambulatory Visit | Attending: Radiation Oncology | Admitting: Radiation Oncology

## 2016-01-19 ENCOUNTER — Ambulatory Visit: Payer: BLUE CROSS/BLUE SHIELD

## 2016-01-19 ENCOUNTER — Telehealth: Payer: Self-pay | Admitting: *Deleted

## 2016-01-19 DIAGNOSIS — R140 Abdominal distension (gaseous): Secondary | ICD-10-CM

## 2016-01-19 DIAGNOSIS — Z510 Encounter for antineoplastic radiation therapy: Secondary | ICD-10-CM | POA: Diagnosis not present

## 2016-01-19 LAB — BASIC METABOLIC PANEL
ANION GAP: 7 (ref 5–15)
BUN: 9 mg/dL (ref 6–20)
CALCIUM: 7.5 mg/dL — AB (ref 8.9–10.3)
CO2: 22 mmol/L (ref 22–32)
Chloride: 106 mmol/L (ref 101–111)
Creatinine, Ser: 1.18 mg/dL (ref 0.61–1.24)
GFR, EST NON AFRICAN AMERICAN: 59 mL/min — AB (ref >60)
GLUCOSE: 92 mg/dL (ref 65–99)
POTASSIUM: 3.1 mmol/L — AB (ref 3.5–5.1)
SODIUM: 135 mmol/L (ref 135–145)

## 2016-01-19 LAB — MAGNESIUM: MAGNESIUM: 1.8 mg/dL (ref 1.7–2.4)

## 2016-01-19 LAB — CBC
HCT: 24.3 % — ABNORMAL LOW (ref 39.0–52.0)
HEMOGLOBIN: 8.6 g/dL — AB (ref 13.0–17.0)
MCH: 31.4 pg (ref 26.0–34.0)
MCHC: 35.4 g/dL (ref 30.0–36.0)
MCV: 88.7 fL (ref 78.0–100.0)
PLATELETS: 123 10*3/uL — AB (ref 150–400)
RBC: 2.74 MIL/uL — AB (ref 4.22–5.81)
RDW: 17.7 % — ABNORMAL HIGH (ref 11.5–15.5)
WBC: 4.2 10*3/uL (ref 4.0–10.5)

## 2016-01-19 LAB — GLUCOSE, CAPILLARY
GLUCOSE-CAPILLARY: 108 mg/dL — AB (ref 65–99)
GLUCOSE-CAPILLARY: 71 mg/dL (ref 65–99)
GLUCOSE-CAPILLARY: 75 mg/dL (ref 65–99)
GLUCOSE-CAPILLARY: 84 mg/dL (ref 65–99)
GLUCOSE-CAPILLARY: 90 mg/dL (ref 65–99)
GLUCOSE-CAPILLARY: 94 mg/dL (ref 65–99)

## 2016-01-19 MED ORDER — DEXTROSE 5 % IV SOLN
3.0000 g | Freq: Once | INTRAVENOUS | Status: AC
  Administered 2016-01-19: 3 g via INTRAVENOUS
  Filled 2016-01-19: qty 6

## 2016-01-19 MED ORDER — LOPERAMIDE HCL 2 MG PO CAPS
2.0000 mg | ORAL_CAPSULE | Freq: Three times a day (TID) | ORAL | Status: AC
  Administered 2016-01-19 – 2016-01-20 (×2): 2 mg via ORAL
  Filled 2016-01-19 (×3): qty 1

## 2016-01-19 MED ORDER — POTASSIUM CHLORIDE CRYS ER 20 MEQ PO TBCR
40.0000 meq | EXTENDED_RELEASE_TABLET | ORAL | Status: AC
  Administered 2016-01-19 (×2): 40 meq via ORAL
  Filled 2016-01-19 (×2): qty 2

## 2016-01-19 MED ORDER — LEVOFLOXACIN 750 MG PO TABS
750.0000 mg | ORAL_TABLET | Freq: Every day | ORAL | Status: DC
  Administered 2016-01-20 – 2016-01-21 (×2): 750 mg via ORAL
  Filled 2016-01-19 (×2): qty 1

## 2016-01-19 NOTE — Progress Notes (Signed)
[facility] PROGRESS NOTE  Antonio Hancock [id number] DOB: January 22, 1941 DOA: 01/12/2016 PCP: Woody Seller, MD  Brief interval history Antonio Hancock is a 75 y.o. male has a past medical history of Hypertension; Diabetes mellitus; Mobitz type 2 second degree AV block; Tobacco abuse; Pacemaker (2012); Chronotropic incompetence with sinus node dysfunction ([place]) (2012); Hyperlipidemia; HOH (hard of hearing); and Anxiety. Presented with chills while undergoing fluids infusion at cancer center. Patient has been dealing with diarrhea side effects of radiation therapy for his pancreatic cancer and has required repeated visits to oncology Center for fluids. Begin controlling his diarrhea with Imodium and was given a prescription for Lomotil. He's been having some nausea and vomiting. Started to have chills tonight his vital signs were checked while at [place] he was noted to be tachycardic and hypotensive while being on his second liter of normal saline at the clinic. Deneis any significant cough, no chest pain, no sick contacts IN ER: Patient developed fever while in emergency department up to 102.5 heart rate as high as 103 respirations up to 24. Blood pressure going systolics going down to mid 80s lactic acid unremarkable 2. UA and chest x-ray both did not show any evidence of infection. Influenza PCR was ordered Regarding pertinent past history: Patient has history of locally advanced pancreatic cancer diagnosed in November 2016 currently undergoing weekly radiation therapy status post ERCP and stent placement for biliary obstruction. currently on Xeloda. Patient has history of atrial fibrillation anticoagulated with Eliquis. He sp pacemaker due to symptomatic bradycardia. Patient used to have history of hypertension but had to discontinue all his blood pressure medications in the past secondary to recurrent hypotension in a setting of dehydration. Also had history of diabetes but given  massive weight loss no longer needs any medications Hospitalist was called for admission for persistent nausea vomiting diarrhea, resulting in dehydration with evidence of SIRS  Assessment/Plan: #1 sepsis Patient presenting with chills, fever with a temperature 102.5, tachycardia, tachypnea and hypotension with systolic blood pressure in the 80s. Patient with complaints of worsening multiple loose stools. Chest x-ray done was negative for any acute infiltrate. Patient has been pancultured results are pending. C. difficile PCR is negative. Stool GI pathogen panel is negative. Urine cultures with 50,000 colonies of Klebsiella pneumonia which is sensitive to ampicillin sulbactam, cephalosporins, fluoroquinolones, Bactrim. CT abdomen and pelvis with trace bilateral effusions with bibasilar airspace opacity which may reflect atelectasis or pneumonia. Patient currently afebrile. Some clinical improvement. Discontinued IV vancomycin and IV Zosyn. IV azithromycin has been discontinued. Change IV Rocephin to oral levaquin.  #2 diarrhea Questionable etiology. 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 Continue Imodium as needed. Increase pancreatic enzyme/creon continue supportive care. Follow.   #3 probable Klebsiella pneumonia urinary tract infection Urine cultures with 50,000 Klebsiella pneumonia. Change IV Rocephin to levaquin. Follow.  #4 probable pneumonia Patient has been pancultured and cultures are pending. Continue nebulizers. Discontinued IV vancomycin and IV Zosyn. Change IV Rocephin to levaquin. Patient is status post 3 days of azithromycin. Supportive care.   #5 pancytopenia Likely chemotherapy-induced. Patient recently on xeloda. Xeloda on hold. Status post 1 dose granix. ANC at 6.7. Supportive care. Oncology ff.  #6 pancreatic cancer Patient receiving radiation treatment. Xeloda on  hold. Will likely need outpatient follow-up.  #7  dehydration IV fluids.  #8 chronic diastolic CHF Patient appears dehydrated. Patient on IV fluids. Monitor closely for volume overload.  #9 ascites Patient with noted ascites  on exam and on CT scan. Status post ultrasound-guided abdominal paracentesis with 3.7 L of fluid removed. Fluid studies pending. Cytology with atypical cells. Repeat US. Follow.  #10 hypertension Continue to hold antihypertensive medications as patient blood pressure is borderline.  #11 status post permanent pacemaker  #12 atrial fibrillation Currently rate controlled. Resumed anticoagulation.  #13 protein calorie malnutrition Nutritional supplementation.  #14 prophylaxis PPI for GI prophylaxis. Eliquis for DVT prophylaxis.   Code Status: DO NOT RESUSCITATE Family Communication: Updated patient, wife, sister at bedside. Disposition Plan: Home when medically stable with improvement in diarrhea and ability to tolerate oral intake and sepsis workup is complete, hopefully tomorrow.   Consultants:  Interventional radiology  Oncology: Dr. Alen Blew 01/16/2016  Procedures:  CT abdomen and pelvis 01/13/2016  chest x-ray 01/12/2016  Paracentesis 01/15/2016  Antibiotics:  IV azithromycin 01/13/2016>>>> 01/16/2016  IV Zosyn 01/12/2016>>>>> 01/16/2016  IV vancomycin 01/12/2016>>>>> 01/16/2016  IV Rocephin 01/16/2016  HPI/Subjective: Patient states still has multiple small loose stools. No CP. Patient feels fluid building back up.  Objective: Filed Vitals:   01/19/16 0545 01/19/16 1351  BP: 113/65 112/49  Pulse: 88 69  Temp: 97.5 F (36.4 C) 97.9 F (36.6 C)  Resp: 18 18    Intake/Output Summary (Last 24 hours) at 01/19/16 1624 Last data filed at 01/19/16 0910  Gross per 24 hour  Intake    600 ml  Output      0 ml  Net    600 ml   Filed Weights   01/12/16 2257  Weight: 66.679 kg (147 lb)    Exam:   General:  NAD.  CACHETIC  Cardiovascular: RRR  Respiratory: Clear to auscultation bilaterally.  Abdomen: Soft,  Non distended, nontender to palpation, positive bowel sounds  Musculoskeletal: No clubbing cyanosis or edema.  Data Reviewed: Basic Metabolic Panel:  Recent Labs Lab 01/13/16 0155  01/15/16 0547 01/16/16 0537 01/17/16 0554 01/18/16 0619 01/19/16 0630  NA 125*  < > 132* 133* 134* 136 135  K 4.0  < > 3.4* 3.1* 3.9 3.8 3.1*  CL 102  < > 111 110 113* 111 106  CO2 15*  < > 16* 16* 17* 20* 22  GLUCOSE 101*  < > 86 93 96 84 92  BUN 18  < > 11 8 7 7 9   CREATININE 1.52*  < > 1.32* 1.27* 1.23 1.15 1.18  CALCIUM 7.4*  < > 7.5* 7.5* 7.6* 7.7* 7.5*  MG 1.3*  < > 2.0 1.6* 1.9 1.7 1.8  PHOS 2.7  --   --   --   --   --   --   < > = values in this interval not displayed. Liver Function Tests:  Recent Labs Lab 01/12/16 1824 01/13/16 0155 01/14/16 0631 01/15/16 0547 01/16/16 0537  AST 36 40 28 24 28   ALT 19 15* 13* 14* 15*  ALKPHOS 118 101 81 75 73  BILITOT 1.4* 1.2 1.0 1.1 1.1  PROT 6.4* 5.8* 4.8* 4.6* 4.7*  ALBUMIN 2.9* 2.5* 2.0* 1.9* 1.9*   No results for input(s): LIPASE, AMYLASE in the last 168 hours. No results for input(s): AMMONIA in the last 168 hours. CBC:  Recent Labs Lab 01/12/16 1824  01/14/16 0631 01/15/16 0547 01/16/16 0537 01/17/16 0554 01/18/16 0619 01/19/16 0630  WBC 3.5*  < > 1.6* 1.2* 4.5 7.8 6.8 4.2  NEUTROABS 3.0  --  1.2* 0.8* 3.8 6.7  --   --   HGB 10.6*  < > 8.3* 8.0* 8.7* 9.0*  8.9* 8.6*  HCT 30.3*  < > 23.5* 22.8* 24.9* 25.6* 25.2* 24.3*  MCV 88.3  < > 88.0 88.4 88.3 88.9 88.7 88.7  PLT 170  < > 109* 116* 120* 119* 126* 123*  < > = values in this interval not displayed. Cardiac Enzymes: No results for input(s): CKTOTAL, CKMB, CKMBINDEX, TROPONINI in the last 168 hours. BNP (last 3 results) No results for input(s): BNP in the last 8760 hours.  ProBNP (last 3 results) No results for input(s): PROBNP in the last 8760 hours.  CBG:  Recent  Labs Lab 01/18/16 2006 01/19/16 0004 01/19/16 0424 01/19/16 0746 01/19/16 1146  GLUCAP 93 94 71 75 84    Recent Results (from the past 240 hour(s))  Urine culture     Status: None   Collection Time: 01/12/16  8:31 PM  Result Value Ref Range Status   Specimen Description URINE, CLEAN CATCH  Final   Special Requests NONE  Final   Culture   Final    50,000 COLONIES/mL KLEBSIELLA PNEUMONIAE Performed at [facility]    Report Status 01/15/2016 FINAL  Final   Organism ID, Bacteria KLEBSIELLA PNEUMONIAE  Final      Susceptibility   Klebsiella pneumoniae - MIC*    AMPICILLIN >=32 RESISTANT Resistant     CEFAZOLIN <=4 SENSITIVE Sensitive     CEFTRIAXONE <=1 SENSITIVE Sensitive     CIPROFLOXACIN <=0.25 SENSITIVE Sensitive     GENTAMICIN <=1 SENSITIVE Sensitive     IMIPENEM <=0.25 SENSITIVE Sensitive     NITROFURANTOIN 64 INTERMEDIATE Intermediate     TRIMETH/SULFA <=20 SENSITIVE Sensitive     AMPICILLIN/SULBACTAM 4 SENSITIVE Sensitive     PIP/TAZO <=4 SENSITIVE Sensitive     * 50,000 COLONIES/mL KLEBSIELLA PNEUMONIAE  Blood Culture (routine x 2)     Status: None   Collection Time: 01/13/16 12:16 AM  Result Value Ref Range Status   Specimen Description BLOOD LEFT ANTECUBITAL  Final   Special Requests IN PEDIATRIC BOTTLE [place]  Final   Culture   Final    NO GROWTH 5 DAYS Performed at [facility]    Report Status 01/18/2016 FINAL  Final  Blood Culture (routine x 2)     Status: None   Collection Time: 01/13/16 12:16 AM  Result Value Ref Range Status   Specimen Description BLOOD LEFT WRIST  Final   Special Requests BOTTLES DRAWN AEROBIC AND ANAEROBIC 5CC  Final   Culture   Final    NO GROWTH 5 DAYS Performed at [facility]    Report Status 01/18/2016 FINAL  Final  Gastrointestinal Panel by PCR , Stool     Status: None   Collection Time: 01/13/16  1:23 AM  Result Value Ref Range Status   Campylobacter species NOT DETECTED NOT DETECTED Final    Plesimonas shigelloides NOT DETECTED NOT DETECTED Final   Salmonella species NOT DETECTED NOT DETECTED Final   Yersinia enterocolitica NOT DETECTED NOT DETECTED Final   Vibrio species NOT DETECTED NOT DETECTED Final   Vibrio cholerae NOT DETECTED NOT DETECTED Final   Enteroaggregative E coli (EAEC) NOT DETECTED NOT DETECTED Final   Enteropathogenic E coli (EPEC) NOT DETECTED NOT DETECTED Final   Enterotoxigenic E coli (ETEC) NOT DETECTED NOT DETECTED Final   Shiga like toxin producing E coli (STEC) NOT DETECTED NOT DETECTED Final   E. coli O157 NOT DETECTED NOT DETECTED Final   Shigella/Enteroinvasive E coli (EIEC) NOT DETECTED NOT DETECTED Final   Cryptosporidium NOT DETECTED  NOT DETECTED Final   Cyclospora cayetanensis NOT DETECTED NOT DETECTED Final   Entamoeba histolytica NOT DETECTED NOT DETECTED Final   Giardia lamblia NOT DETECTED NOT DETECTED Final   Adenovirus F40/41 NOT DETECTED NOT DETECTED Final   Astrovirus NOT DETECTED NOT DETECTED Final   Norovirus GI/GII NOT DETECTED NOT DETECTED Final   Rotavirus A NOT DETECTED NOT DETECTED Final   Sapovirus (I, II, IV, and V) NOT DETECTED NOT DETECTED Final  C difficile quick scan w PCR reflex     Status: None   Collection Time: 01/13/16  1:23 AM  Result Value Ref Range Status   C Diff antigen NEGATIVE NEGATIVE Final   C Diff toxin NEGATIVE NEGATIVE Final   C Diff interpretation Negative for toxigenic C. difficile  Final  Anaerobic culture     Status: None (Preliminary result)   Collection Time: 01/15/16  2:20 PM  Result Value Ref Range Status   Specimen Description PERITONEAL  Final   Special Requests NONE  Final   Gram Stain   Final    NO WBC SEEN NO SQUAMOUS EPITHELIAL CELLS SEEN NO ORGANISMS SEEN Performed at [facility]    Culture   Final    NO ANAEROBES ISOLATED; CULTURE IN PROGRESS FOR 5 DAYS Performed at [facility]    Report Status PENDING  Incomplete  Culture, body fluid-bottle     Status: None  (Preliminary result)   Collection Time: 01/15/16  2:20 PM  Result Value Ref Range Status   Specimen Description PERITONEAL  Final   Special Requests BOTTLES DRAWN AEROBIC AND ANAEROBIC 10MLS  Final   Culture   Final    NO GROWTH 4 DAYS Performed at [facility]    Report Status PENDING  Incomplete  Gram stain     Status: None   Collection Time: 01/15/16  2:20 PM  Result Value Ref Range Status   Specimen Description FLUID PERITONEAL  Final   Special Requests NONE  Final   Gram Stain   Final    FEW WBC PRESENT, PREDOMINANTLY MONONUCLEAR NO ORGANISMS SEEN Performed at [facility]    Report Status 01/15/2016 FINAL  Final     Studies: No results found.  Scheduled Meds: . ALPRAZolam  0.25 mg Oral QHS  . apixaban  5 mg Oral BID  . arformoterol  15 mcg Nebulization BID  . atorvastatin  10 mg Oral Daily  . budesonide (PULMICORT) nebulizer solution  0.25 mg Nebulization BID  . cefTRIAXone (ROCEPHIN)  IV  2 g Intravenous Q24H  . fluticasone  2 spray Each Nare Daily  . insulin aspart  0-9 Units Subcutaneous 6 times per day  . lipase/protease/amylase  36,000 Units Oral TID WC  . loratadine  10 mg Oral Daily  . morphine  30 mg Oral q morning - 10a  . nicotine  21 mg Transdermal Daily  . pantoprazole  40 mg Oral Q1200  . tiotropium  18 mcg Inhalation Daily   Continuous Infusions:    Principal Problem:   Severe sepsis (HCC) Active Problems:   Hypertension   Diabetes mellitus ([place])   Tobacco abuse   Pacemaker-[employer]   Atrial fibrillation (HCC)   Obstructive sleep apnea   Hyponatremia   Malignant neoplasm of head of pancreas (HCC)   Dehydration   Chronic diastolic CHF (congestive heart failure), NYHA class 1 (HCC)   CKD (chronic kidney disease) stage 3, GFR 30-59 ml/min   Hypomagnesemia   Ascites   Pneumonia  Colitis   Protein-calorie malnutrition, severe   Neonatal sepsis due to group B Streptococcus (HCC)   Antineoplastic chemotherapy induced  pancytopenia (HCC)   Acute cystitis without hematuria   Hypokalemia    Time spent: 6 minutes    THOMPSON,DANIEL M.D. [facility] Pager [phone]. If 7PM-7AM, please contact night-coverage at [email or web address], password [facility] 01/19/2016, 4:24 PM  LOS: 5 days

## 2016-01-19 NOTE — Progress Notes (Signed)
PT Cancellation Note  Patient Details Name: Antonio Hancock MRN: [id number] DOB: 26-Jul-1941   Cancelled Treatment:    Reason Eval/Treat Not Completed: Fatigue/lethargy limiting ability to participate (pt sleeping soundly in bed, per family he didn't sleep well last night, they requested he be allowed to rest now, PT will attempt later today. )   [name] 01/19/2016, 10:02 AM [phone]

## 2016-01-19 NOTE — Progress Notes (Signed)
IP PROGRESS NOTE  Subjective:   Antonio Hancock feels fairly well at this time without any major complaints. He does not report any abdominal pain or discomfort. Has not reported any nausea or vomiting. Diarrhea have improved slightly. He is reporting some increase in his abdominal distention.  Objective:  Vital signs in last 24 hours: Temp:  [97.5 F (36.4 C)-98.6 F (37 C)] 97.5 F (36.4 C) (02/13 0545) Pulse Rate:  [63-88] 88 (02/13 0545) Resp:  [18] 18 (02/13 0545) BP: (112-114)/(57-65) 113/65 mmHg (02/13 0545) SpO2:  [94 %-100 %] 100 % (02/13 0545) Weight change:  Last BM Date: 01/18/16  Intake/Output from previous day: 02/12 0701 - 02/13 0700 In: 840 [P.O.:840] Out: -  Chronically ill-appearing gentleman without distress. Mouth: mucous membranes moist, pharynx normal without lesions Resp: clear to auscultation bilaterally Cardio: regular rate and rhythm, S1, S2 normal, no murmur, click, rub or gallop GI: soft, non-tender; bowel sounds normal; no masses,  no organomegaly slightly distended with shifting dullness. Extremities: extremities normal, atraumatic, no cyanosis or edema    Lab Results:  Recent Labs  01/18/16 0619 01/19/16 0630  WBC 6.8 4.2  HGB 8.9* 8.6*  HCT 25.2* 24.3*  PLT 126* 123*    BMET  Recent Labs  01/18/16 0619 01/19/16 0630  NA 136 135  K 3.8 PENDING  CL 111 106  CO2 20* 22  GLUCOSE 84 92  BUN 7 9  CREATININE 1.15 1.18  CALCIUM 7.7* 7.5*    Studies/Results: No results found. IMPRESSION: 1. Increasing moderate volume ascites within Antonio abdomen and pelvis. 2. Apparent diffuse wall thickening and mucosal edema along Antonio ascending colon, which may reflect mild infectious or inflammatory colitis, depending on Antonio Hancock's symptoms. Alternatively, this may be related to Antonio Hancock's ascites. 3. Trace bilateral pleural effusions, with bibasilar airspace opacity, which may reflect atelectasis or pneumonia. 4. Biliary duct stent is  noted in unchanged position, with a large amount of pneumobilia noted within Antonio liver. 5. Known pancreatic head mass difficult to fully assess without contrast. 6. 3.5 cm hyperdense mass along Antonio lateral aspect of Antonio right kidney has mildly increased in size and may reflect a complex renal cyst for malignancy, as previously described. 7. Diffuse calcification along Antonio abdominal aorta and its branches. 8. 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 Diffuse calcification along Antonio abdominal aorta and its branches. Recommend followup by ultrasound in 3 years. This recommendation follows ACR consensus guidelines: White Paper of Antonio ACR Incidental Findings Committee II on Vascular Findings. J Am Coll Radiol 2013; 10:789-794. 9. Scattered diverticulosis along Antonio descending and sigmoid colon, without evidence of diverticulitis. 10. Enlarged prostate noted.    Medications: I have reviewed Antonio Hancock's current medications.  Assessment/Plan:  75 year old gentleman with Antonio following issues:  1. Locally advanced pancreatic cancer. Currently receiving radiation therapy was oral Xeloda and therapy is on hold for Antonio time being. This will be resumed upon discharge.  2. Ascites: Unclear etiology but could be worrisome for metastatic disease and peritoneal involvement. He might need a repeat paracentesis prior to discharge. I recommending obtaining fluid cytology at that time.  3. Diarrhea: Multifactorial could be related to Xeloda versus pancreatic insufficiency. Seems to be improving slightly.  4. Neutropenia: This have resolved at this time his white cell count is back to normal. No need for any growth factor support at this time.  5. Disposition: I agree with Antonio current management and would recommend continue supportive  care as you are doing. He appears to be close to discharge and follow-up will be arranged upon discharge.   LOS: 5 days    [facility] 01/19/2016, 7:51 AM

## 2016-01-19 NOTE — Care Management Note (Signed)
Case Management Note  Patient Details  Name: Antonio Hancock MRN: [id number] Date of Birth: 1941/08/15  Subjective/Objective:         75 yo admitted with Severe Sepsis           Action/Plan: From home with spouse.  Expected Discharge Date:                  Expected Discharge Plan:  [place]  In-House Referral:     Discharge planning Services  CM Consult  Post Acute Care Choice:  Home Health Choice offered to:  Patient  DME Arranged:  Walker rolling DME Agency:  [place]:  PT [facility] Agency:  [place]  Status of Service:  In process, will continue to follow  Medicare Important Message Given:    Date Medicare IM Given:    Medicare IM give by:    Date Additional Medicare IM Given:    Additional Medicare Important Message give by:     If discussed at [place] of Stay Meetings, dates discussed:    Additional Comments: PT recommendations discussed with Pt.  Pt states his is interested in HHPT and when offered choice he chooses [facility].  [facility] rep called and given referral.  Pt states he needs a rolling walker as well.  [place] DME rep contacted for RW.  Both [place] orders and orders for RW have been written.  No other CM needs communicated at this time. CM will continue to follow. Lynnell Catalan, RN 01/19/2016, 2:30 PM

## 2016-01-19 NOTE — Progress Notes (Signed)
Physical Therapy Treatment Patient Details Name: Antonio Hancock MRN: [id number] DOB: [birth year] Today's Date: 01/19/2016    History of Present Illness 75 y.o. male with h/o pancreatic cancer admitted with N/V/D, ascites, sepsis. H/o HTN, DM, pacemaker, a fib, OSA, CHF.     PT Comments    Pt progressing well with mobility, he ambulated 380' with RW without loss of balance.   Follow Up Recommendations  Supervision for mobility/OOB;Home health PT     Equipment Recommendations  Rolling walker with 5" wheels    Recommendations for Other Services OT consult     Precautions / Restrictions Precautions Precautions: Fall Precaution Comments: HOH Restrictions Weight Bearing Restrictions: No    Mobility  Bed Mobility Overal bed mobility: Modified Independent             General bed mobility comments: used rail, HOB up 35*  Transfers Overall transfer level: Needs assistance Equipment used: Rolling walker (2 wheeled)   Sit to Stand: Supervision         General transfer comment: supervision for safety, no physical assist  Ambulation/Gait Ambulation/Gait assistance: Supervision Ambulation Distance (Feet): 380 Feet Assistive device: Rolling walker (2 wheeled) Gait Pattern/deviations: Step-through pattern   Gait velocity interpretation: at or above normal speed for age/gender General Gait Details: verbal cues for posture due to flexed neck, no LOB   Stairs            Wheelchair Mobility    Modified Rankin (Stroke Patients Only)       Balance Overall balance assessment: Modified Independent                                  Cognition Arousal/Alertness: Awake/alert Behavior During Therapy: WFL for tasks assessed/performed Overall Cognitive Status: Within Functional Limits for tasks assessed                      Exercises      General Comments        Pertinent Vitals/Pain Pain Assessment: No/denies pain    Home Living                      Prior Function            PT Goals (current goals can now be found in the care plan section) Acute Rehab PT Goals Patient Stated Goal: to get stronger PT Goal Formulation: With patient/family Time For Goal Achievement: 01/30/16 Potential to Achieve Goals: Good Progress towards PT goals: Progressing toward goals    Frequency  Min 3X/week    PT Plan Current plan remains appropriate    Co-evaluation             End of Session Equipment Utilized During Treatment: Gait belt Activity Tolerance: Patient limited by fatigue Patient left: with call bell/phone within reach;with family/visitor present;in chair     Time: [id number] PT Time Calculation (min) (ACUTE ONLY): 18 min  Charges:  $Gait Training: 8-22 mins                    G Codes:      [name] 01/19/2016, 12:46 PM [phone]

## 2016-01-19 NOTE — Telephone Encounter (Signed)
called the floor, Rn satted patient can be treated for radiation today, thanked Therapist, sports and called Faith on [facility] #4, informed ok to treat today, e-mailed Shona Simpson, PA-C

## 2016-01-19 NOTE — Evaluation (Signed)
Occupational Therapy Evaluation Patient Details Name: Antonio Hancock MRN: [id number] DOB: 08/17/1941 Today's Date: 01/19/2016    History of Present Illness 75 y.o. male with h/o pancreatic cancer admitted with N/V/D, ascites, sepsis. H/o HTN, DM, pacemaker, a fib, OSA, CHF.    Clinical Impression   Pt admitted with N/V/D. Pt currently with functional limitations due to the deficits listed below (see OT Problem List). Pt will benefit from skilled OT to increase their safety and independence with ADL and functional mobility for ADL to facilitate discharge to venue listed below.      Follow Up Recommendations  Supervision - Intermittent;No OT follow up    Equipment Recommendations  None recommended by OT       Precautions / Restrictions Precautions Precautions: Fall Precaution Comments: HOH Restrictions Weight Bearing Restrictions: No      Mobility Bed Mobility Overal bed mobility: Modified Independent             General bed mobility comments: used rail, HOB up 35*  Transfers Overall transfer level: Needs assistance Equipment used: Rolling walker (2 wheeled)   Sit to Stand: Supervision         General transfer comment: supervision for safety, no physical assist    Balance Overall balance assessment: Modified Independent                                          ADL Overall ADL's : Needs assistance/impaired     Grooming: Set up;Sitting   Upper Body Bathing: Set up;Sitting   Lower Body Bathing: Min guard;Sit to/from stand;Cueing for safety   Upper Body Dressing : Set up;Sitting   Lower Body Dressing: Min guard;Sit to/from Archivist: Supervision/safety;Regular Museum/gallery exhibitions officer and Hygiene: Supervision/safety;Sit to/from stand                         Pertinent Vitals/Pain Pain Assessment: No/denies pain        Extremity/Trunk Assessment Upper Extremity Assessment Upper Extremity  Assessment: Generalized weakness           Communication Communication Communication: HOH   Cognition Arousal/Alertness: Awake/alert Behavior During Therapy: WFL for tasks assessed/performed Overall Cognitive Status: Within Functional Limits for tasks assessed                     General Comments   pt has to leave for treatment at [place] expects to be discharged to:: Private residence Living Arrangements: Spouse/significant other Available Help at Discharge: Family;Available PRN/intermittently Type of Home: House Home Access: Stairs to enter [employer] of Steps: 5 Entrance Stairs-Rails: Right;Left Home Layout: One level     Bathroom Shower/Tub: Teacher, early years/pre: Standard     Home Equipment: None   Additional Comments: wife works 12* day shift      Prior Functioning/Environment Level of Independence: Independent             OT Diagnosis: Generalized weakness   OT Problem List: Decreased strength;Decreased activity tolerance   OT Treatment/Interventions: Self-care/ADL training;DME and/or AE instruction;Patient/family education    OT Goals(Current goals can be found in the care plan section) Acute Rehab OT Goals Patient Stated Goal: to get stronger OT Goal Formulation: With patient Time For Goal Achievement: 02/02/16  Potential to Achieve Goals: Good  OT Frequency: Min 2X/week              End of Session    Activity Tolerance: Patient tolerated treatment well (eval shortened at cancer center came to get pt) Patient left: Other (comment) (in [place] with transport)   Time: [id number] OT Time Calculation (min): 10 min Charges:  OT General Charges $OT Visit: 1 Procedure OT Evaluation $OT Eval Low Complexity: 1 Procedure G-Codes:    Eryka Dolinger, Thereasa Parkin 2016-01-27, 1:10 PM

## 2016-01-20 ENCOUNTER — Ambulatory Visit: Payer: BLUE CROSS/BLUE SHIELD

## 2016-01-20 ENCOUNTER — Inpatient Hospital Stay (HOSPITAL_COMMUNITY): Payer: BLUE CROSS/BLUE SHIELD

## 2016-01-20 ENCOUNTER — Encounter: Payer: Self-pay | Admitting: Radiation Oncology

## 2016-01-20 ENCOUNTER — Ambulatory Visit
Admission: RE | Admit: 2016-01-20 | Discharge: 2016-01-20 | Disposition: A | Discharge status: Home / Self Care | Payer: BLUE CROSS/BLUE SHIELD | Source: Ambulatory Visit | Attending: Radiation Oncology | Admitting: Radiation Oncology

## 2016-01-20 DIAGNOSIS — Z510 Encounter for antineoplastic radiation therapy: Secondary | ICD-10-CM | POA: Diagnosis not present

## 2016-01-20 LAB — CBC WITH DIFFERENTIAL/PLATELET
BASOS ABS: 0 10*3/uL (ref 0.0–0.1)
BASOS PCT: 0 %
EOS ABS: 0.3 10*3/uL (ref 0.0–0.7)
EOS PCT: 10 %
HCT: 24.4 % — ABNORMAL LOW (ref 39.0–52.0)
Hemoglobin: 8.5 g/dL — ABNORMAL LOW (ref 13.0–17.0)
LYMPHS PCT: 11 %
Lymphs Abs: 0.3 10*3/uL — ABNORMAL LOW (ref 0.7–4.0)
MCH: 31 pg (ref 26.0–34.0)
MCHC: 34.8 g/dL (ref 30.0–36.0)
MCV: 89.1 fL (ref 78.0–100.0)
MONO ABS: 0.4 10*3/uL (ref 0.1–1.0)
Monocytes Relative: 11 %
NEUTROS ABS: 2.1 10*3/uL (ref 1.7–7.7)
NEUTROS PCT: 67 %
PLATELETS: 119 10*3/uL — AB (ref 150–400)
RBC: 2.74 MIL/uL — ABNORMAL LOW (ref 4.22–5.81)
RDW: 17.9 % — AB (ref 11.5–15.5)
WBC: 3.2 10*3/uL — ABNORMAL LOW (ref 4.0–10.5)

## 2016-01-20 LAB — ANAEROBIC CULTURE: GRAM STAIN: NONE SEEN

## 2016-01-20 LAB — BODY FLUID CELL COUNT WITH DIFFERENTIAL
Eos, Fluid: 1 %
LYMPHS FL: 10 %
MONOCYTE-MACROPHAGE-SEROUS FLUID: 63 % (ref 50–90)
Neutrophil Count, Fluid: 26 % — ABNORMAL HIGH (ref 0–25)
WBC FLUID: 58 uL (ref 0–1000)

## 2016-01-20 LAB — BASIC METABOLIC PANEL
ANION GAP: 6 (ref 5–15)
BUN: 10 mg/dL (ref 6–20)
CALCIUM: 7.7 mg/dL — AB (ref 8.9–10.3)
CO2: 23 mmol/L (ref 22–32)
Chloride: 105 mmol/L (ref 101–111)
Creatinine, Ser: 1.09 mg/dL (ref 0.61–1.24)
GLUCOSE: 84 mg/dL (ref 65–99)
POTASSIUM: 3.8 mmol/L (ref 3.5–5.1)
Sodium: 134 mmol/L — ABNORMAL LOW (ref 135–145)

## 2016-01-20 LAB — GLUCOSE, SEROUS FLUID: Glucose, Fluid: 102 mg/dL

## 2016-01-20 LAB — GLUCOSE, CAPILLARY
GLUCOSE-CAPILLARY: 67 mg/dL (ref 65–99)
GLUCOSE-CAPILLARY: 75 mg/dL (ref 65–99)
GLUCOSE-CAPILLARY: 92 mg/dL (ref 65–99)
Glucose-Capillary: 86 mg/dL (ref 65–99)
Glucose-Capillary: 90 mg/dL (ref 65–99)

## 2016-01-20 LAB — CULTURE, BODY FLUID-BOTTLE

## 2016-01-20 LAB — CULTURE, BODY FLUID W GRAM STAIN -BOTTLE: Culture: NO GROWTH

## 2016-01-20 LAB — LACTATE DEHYDROGENASE, PLEURAL OR PERITONEAL FLUID: LD, Fluid: 29 U/L — ABNORMAL HIGH (ref 3–23)

## 2016-01-20 LAB — PROTEIN, BODY FLUID: Total protein, fluid: <3 g/dL

## 2016-01-20 MED ORDER — ENSURE ENLIVE PO LIQD
237.0000 mL | Freq: Two times a day (BID) | ORAL | Status: DC
  Administered 2016-01-20: 237 mL via ORAL

## 2016-01-20 MED ORDER — ALBUMIN HUMAN 25 % IV SOLN
50.0000 g | Freq: Once | INTRAVENOUS | Status: AC
  Administered 2016-01-20: 50 g via INTRAVENOUS
  Filled 2016-01-20: qty 200

## 2016-01-20 NOTE — Progress Notes (Signed)
[facility] PROGRESS NOTE  Antonio Hancock [id number] DOB: 08-31-1941 DOA: 01/12/2016 PCP: Woody Seller, MD  Brief interval history Antonio Hancock is a 75 y.o. male has a past medical history of Hypertension; Diabetes mellitus; Mobitz type 2 second degree AV block; Tobacco abuse; Pacemaker (2012); Chronotropic incompetence with sinus node dysfunction ([place]) (2012); Hyperlipidemia; HOH (hard of hearing); and Anxiety. Presented with chills while undergoing fluids infusion at cancer center. Patient has been dealing with diarrhea side effects of radiation therapy for his pancreatic cancer and has required repeated visits to oncology Center for fluids. Begin controlling his diarrhea with Imodium and was given a prescription for Lomotil. He's been having some nausea and vomiting. Started to have chills tonight his vital signs were checked while at [place] he was noted to be tachycardic and hypotensive while being on his second liter of normal saline at the clinic. Deneis any significant cough, no chest pain, no sick contacts IN ER: Patient developed fever while in emergency department up to 102.5 heart rate as high as 103 respirations up to 24. Blood pressure going systolics going down to mid 80s lactic acid unremarkable 2. UA and chest x-ray both did not show any evidence of infection. Influenza PCR was ordered Regarding pertinent past history: Patient has history of locally advanced pancreatic cancer diagnosed in November 2016 currently undergoing weekly radiation therapy status post ERCP and stent placement for biliary obstruction. currently on Xeloda. Patient has history of atrial fibrillation anticoagulated with Eliquis. He sp pacemaker due to symptomatic bradycardia. Patient used to have history of hypertension but had to discontinue all his blood pressure medications in the past secondary to recurrent hypotension in a setting of dehydration. Also had history of diabetes but given  massive weight loss no longer needs any medications Hospitalist was called for admission for persistent nausea vomiting diarrhea, resulting in dehydration with evidence of SIRS  Patient was admitted and noted to be septic with temperature 102.5, tachycardia, tachypnea, hypotension with systolic blood pressures in the 80s. Patient was also noted to have worsening loose stools. Infectious workup was concerning for possible pneumonia and urinary tract infection. Patient received a full course of antibiotic treatment. Patient also noted to have a ascites. Patient underwent a paracentesis on 01/15/2016 and cytology showed atypical cells. Patient did have a reaccumulation of ascitic fluid and is to undergo an ultrasound paracentesis today 01/20/2016. Patient improving clinically.  Assessment/Plan: #1 sepsis Patient presenting with chills, fever with a temperature 102.5, tachycardia, tachypnea and hypotension with systolic blood pressure in the 80s. Patient with complaints of worsening multiple loose stools. Chest x-ray done was negative for any acute infiltrate. Patient has been pancultured results are pending. C. difficile PCR is negative. Stool GI pathogen panel is negative. Urine cultures with 50,000 colonies of Klebsiella pneumonia which is sensitive to ampicillin sulbactam, cephalosporins, fluoroquinolones, Bactrim. CT abdomen and pelvis with trace bilateral effusions with bibasilar airspace opacity which may reflect atelectasis or pneumonia. Patient currently afebrile. Some clinical improvement. Discontinued IV vancomycin and IV Zosyn. IV azithromycin has been discontinued. Change IV Rocephin to oral levaquin.  #2 diarrhea Questionable etiology. 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 Improving with increased pancreatic enzymes and scheduled Imodium. Follow.   #3 probable Klebsiella  pneumonia urinary tract infection Urine cultures with 50,000 Klebsiella pneumonia. Continue levaquin. Follow.  #4 probable pneumonia Patient has been pancultured and cultures are pending. Continue nebulizers. Discontinued IV vancomycin and IV Zosyn. Changed IV Rocephin  to levaquin. Patient is status post 3 days of azithromycin. Supportive care. Patient will likely not need any further antibiotics on discharge.  #5 pancytopenia Likely chemotherapy-induced. Patient recently on xeloda. Xeloda on hold. Status post 1 dose granix. ANC at 2.1. Supportive care. Oncology ff.  #6 pancreatic cancer Patient receiving radiation treatment. Xeloda on hold. Will likely need outpatient follow-up.  #7  dehydration Improved with hydration.  #8 chronic diastolic CHF Patient appears dehydrated. Patient s/p IV fluids. Monitor closely for volume overload.  #9 ascites Patient with noted ascites on exam and on CT scan. Status post ultrasound-guided abdominal paracentesis with 3.7 L of fluid removed on 01/15/2016. Cytology with atypical cells. Repeat ultrasound today with moderate ascites and a such will have interventional radiology do a diagnostic and therapeutic paracentesis. Will resend fluid off for repeat cytology. Will give albumin.  #10 hypertension Continue to hold antihypertensive medications as patient blood pressure is borderline.  #11 status post permanent pacemaker  #12 atrial fibrillation Currently rate controlled. Resumed anticoagulation.  #13 protein calorie malnutrition Nutritional supplementation.  #14 prophylaxis PPI for GI prophylaxis. Eliquis for DVT prophylaxis.   Code Status: DO NOT RESUSCITATE Family Communication: Updated patient, sisters at bedside. Disposition Plan: Home when medically stable with improvement in diarrhea and ability to tolerate oral intake hopefully tomorrow.   Consultants:  Interventional radiology  Oncology: Dr. Alen Blew 01/16/2016  Procedures:  CT  abdomen and pelvis 01/13/2016  chest x-ray 01/12/2016  Paracentesis 01/15/2016, 01/20/2016  Antibiotics:  IV azithromycin 01/13/2016>>>> 01/16/2016  IV Zosyn 01/12/2016>>>>> 01/16/2016  IV vancomycin 01/12/2016>>>>> 01/16/2016  IV Rocephin 01/16/2016>>>>> 11/18/2016  Oral Levaquin 01/20/2016  HPI/Subjective: Patient states had bowel movement this morning I was a little more formed. Patient feels fluid building back up in his abdomen. No shortness of breath. No chest pain. still has multiple small loose stools. No CP. Patient feels fluid building back up.  Objective: Filed Vitals:   01/20/16 0654 01/20/16 1438  BP: 113/62 101/52  Pulse: 69 71  Temp: 97.9 F (36.6 C) 97.8 F (36.6 C)  Resp: 18 16    Intake/Output Summary (Last 24 hours) at 01/20/16 1601 Last data filed at 01/20/16 1428  Gross per 24 hour  Intake      0 ml  Output    450 ml  Net   -450 ml   Filed Weights   01/12/16 2257  Weight: 66.679 kg (147 lb)    Exam:   General:  NAD. CACHETIC  Cardiovascular: RRR  Respiratory: Clear to auscultation bilaterally.  Abdomen: Soft,  mildly distended, nontender to palpation, positive bowel sounds  Musculoskeletal: No clubbing cyanosis or edema.  Data Reviewed: Basic Metabolic Panel:  Recent Labs Lab 01/15/16 0547 01/16/16 0537 01/17/16 0554 01/18/16 0619 01/19/16 0630 01/20/16 0559  NA 132* 133* 134* 136 135 134*  K 3.4* 3.1* 3.9 3.8 3.1* 3.8  CL 111 110 113* 111 106 105  CO2 16* 16* 17* 20* 22 23  GLUCOSE 86 93 96 84 92 84  BUN 11 8 7 7 9 10   CREATININE 1.32* 1.27* 1.23 1.15 1.18 1.09  CALCIUM 7.5* 7.5* 7.6* 7.7* 7.5* 7.7*  MG 2.0 1.6* 1.9 1.7 1.8  --    Liver Function Tests:  Recent Labs Lab 01/14/16 0631 01/15/16 0547 01/16/16 0537  AST 28 24 28   ALT 13* 14* 15*  ALKPHOS 81 75 73  BILITOT 1.0 1.1 1.1  PROT 4.8* 4.6* 4.7*  ALBUMIN 2.0* 1.9* 1.9*   No results for input(s): LIPASE, AMYLASE  in the last 168 hours. No results for  input(s): AMMONIA in the last 168 hours. CBC:  Recent Labs Lab 01/14/16 0631 01/15/16 0547 01/16/16 0537 01/17/16 0554 01/18/16 0619 01/19/16 0630 01/20/16 0559  WBC 1.6* 1.2* 4.5 7.8 6.8 4.2 3.2*  NEUTROABS 1.2* 0.8* 3.8 6.7  --   --  2.1  HGB 8.3* 8.0* 8.7* 9.0* 8.9* 8.6* 8.5*  HCT 23.5* 22.8* 24.9* 25.6* 25.2* 24.3* 24.4*  MCV 88.0 88.4 88.3 88.9 88.7 88.7 89.1  PLT 109* 116* 120* 119* 126* 123* 119*   Cardiac Enzymes: No results for input(s): CKTOTAL, CKMB, CKMBINDEX, TROPONINI in the last 168 hours. BNP (last 3 results) No results for input(s): BNP in the last 8760 hours.  ProBNP (last 3 results) No results for input(s): PROBNP in the last 8760 hours.  CBG:  Recent Labs Lab 01/19/16 1650 01/19/16 2047 01/20/16 0012 01/20/16 0739 01/20/16 1247  GLUCAP 90 108* 92 75 67    Recent Results (from the past 240 hour(s))  Urine culture     Status: None   Collection Time: 01/12/16  8:31 PM  Result Value Ref Range Status   Specimen Description URINE, CLEAN CATCH  Final   Special Requests NONE  Final   Culture   Final    50,000 COLONIES/mL KLEBSIELLA PNEUMONIAE Performed at [facility]    Report Status 01/15/2016 FINAL  Final   Organism ID, Bacteria KLEBSIELLA PNEUMONIAE  Final      Susceptibility   Klebsiella pneumoniae - MIC*    AMPICILLIN >=32 RESISTANT Resistant     CEFAZOLIN <=4 SENSITIVE Sensitive     CEFTRIAXONE <=1 SENSITIVE Sensitive     CIPROFLOXACIN <=0.25 SENSITIVE Sensitive     GENTAMICIN <=1 SENSITIVE Sensitive     IMIPENEM <=0.25 SENSITIVE Sensitive     NITROFURANTOIN 64 INTERMEDIATE Intermediate     TRIMETH/SULFA <=20 SENSITIVE Sensitive     AMPICILLIN/SULBACTAM 4 SENSITIVE Sensitive     PIP/TAZO <=4 SENSITIVE Sensitive     * 50,000 COLONIES/mL KLEBSIELLA PNEUMONIAE  Blood Culture (routine x 2)     Status: None   Collection Time: 01/13/16 12:16 AM  Result Value Ref Range Status   Specimen Description BLOOD LEFT ANTECUBITAL  Final    Special Requests IN PEDIATRIC BOTTLE [place]  Final   Culture   Final    NO GROWTH 5 DAYS Performed at [facility]    Report Status 01/18/2016 FINAL  Final  Blood Culture (routine x 2)     Status: None   Collection Time: 01/13/16 12:16 AM  Result Value Ref Range Status   Specimen Description BLOOD LEFT WRIST  Final   Special Requests BOTTLES DRAWN AEROBIC AND ANAEROBIC 5CC  Final   Culture   Final    NO GROWTH 5 DAYS Performed at [facility]    Report Status 01/18/2016 FINAL  Final  Gastrointestinal Panel by PCR , Stool     Status: None   Collection Time: 01/13/16  1:23 AM  Result Value Ref Range Status   Campylobacter species NOT DETECTED NOT DETECTED Final   Plesimonas shigelloides NOT DETECTED NOT DETECTED Final   Salmonella species NOT DETECTED NOT DETECTED Final   Yersinia enterocolitica NOT DETECTED NOT DETECTED Final   Vibrio species NOT DETECTED NOT DETECTED Final   Vibrio cholerae NOT DETECTED NOT DETECTED Final   Enteroaggregative E coli (EAEC) NOT DETECTED NOT DETECTED Final   Enteropathogenic E coli (EPEC) NOT DETECTED NOT DETECTED Final   Enterotoxigenic E coli (  ETEC) NOT DETECTED NOT DETECTED Final   Shiga like toxin producing E coli (STEC) NOT DETECTED NOT DETECTED Final   E. coli O157 NOT DETECTED NOT DETECTED Final   Shigella/Enteroinvasive E coli (EIEC) NOT DETECTED NOT DETECTED Final   Cryptosporidium NOT DETECTED NOT DETECTED Final   Cyclospora cayetanensis NOT DETECTED NOT DETECTED Final   Entamoeba histolytica NOT DETECTED NOT DETECTED Final   Giardia lamblia NOT DETECTED NOT DETECTED Final   Adenovirus F40/41 NOT DETECTED NOT DETECTED Final   Astrovirus NOT DETECTED NOT DETECTED Final   Norovirus GI/GII NOT DETECTED NOT DETECTED Final   Rotavirus A NOT DETECTED NOT DETECTED Final   Sapovirus (I, II, IV, and V) NOT DETECTED NOT DETECTED Final  C difficile quick scan w PCR reflex     Status: None   Collection Time: 01/13/16  1:23 AM  Result  Value Ref Range Status   C Diff antigen NEGATIVE NEGATIVE Final   C Diff toxin NEGATIVE NEGATIVE Final   C Diff interpretation Negative for toxigenic C. difficile  Final  Anaerobic culture     Status: None   Collection Time: 01/15/16  2:20 PM  Result Value Ref Range Status   Specimen Description PERITONEAL  Final   Special Requests NONE  Final   Gram Stain   Final    NO WBC SEEN NO SQUAMOUS EPITHELIAL CELLS SEEN NO ORGANISMS SEEN Performed at [facility]    Culture   Final    NO ANAEROBES ISOLATED Performed at [facility]    Report Status 01/20/2016 FINAL  Final  Culture, body fluid-bottle     Status: None (Preliminary result)   Collection Time: 01/15/16  2:20 PM  Result Value Ref Range Status   Specimen Description PERITONEAL  Final   Special Requests BOTTLES DRAWN AEROBIC AND ANAEROBIC 10MLS  Final   Culture   Final    NO GROWTH 4 DAYS Performed at [facility]    Report Status PENDING  Incomplete  Gram stain     Status: None   Collection Time: 01/15/16  2:20 PM  Result Value Ref Range Status   Specimen Description FLUID PERITONEAL  Final   Special Requests NONE  Final   Gram Stain   Final    FEW WBC PRESENT, PREDOMINANTLY MONONUCLEAR NO ORGANISMS SEEN Performed at [facility]    Report Status 01/15/2016 FINAL  Final     Studies: US Abdomen Limited  01/20/2016  CLINICAL DATA:  Ascites. EXAM: LIMITED ABDOMEN ULTRASOUND FOR ASCITES TECHNIQUE: Limited ultrasound survey for ascites was performed in all four abdominal quadrants. COMPARISON:  CT 01/13/2016 FINDINGS: Exam demonstrates moderate simple appearing ascites in all 4 quadrants. IMPRESSION: Moderate ascites. Electronically Signed   By: Marin Olp M.D.   On: 01/20/2016 13:35    Scheduled Meds: . albumin human  50 g Intravenous Once  . ALPRAZolam  0.25 mg Oral QHS  . apixaban  5 mg Oral BID  . arformoterol  15 mcg Nebulization BID  . atorvastatin  10 mg Oral Daily  .  budesonide (PULMICORT) nebulizer solution  0.25 mg Nebulization BID  . feeding supplement (ENSURE ENLIVE)  237 mL Oral BID BM  . fluticasone  2 spray Each Nare Daily  . insulin aspart  0-9 Units Subcutaneous 6 times per day  . levofloxacin  750 mg Oral Daily  . lipase/protease/amylase  36,000 Units Oral TID WC  . loperamide  2 mg Oral TID  . loratadine  10 mg Oral  Daily  . morphine  30 mg Oral q morning - 10a  . nicotine  21 mg Transdermal Daily  . pantoprazole  40 mg Oral Q1200  . tiotropium  18 mcg Inhalation Daily   Continuous Infusions:    Principal Problem:   Severe sepsis (HCC) Active Problems:   Hypertension   Diabetes mellitus (HCC)   Tobacco abuse   Pacemaker-[employer]   Atrial fibrillation (HCC)   Obstructive sleep apnea   Hyponatremia   Malignant neoplasm of head of pancreas (HCC)   Dehydration   Chronic diastolic CHF (congestive heart failure), NYHA class 1 (HCC)   CKD (chronic kidney disease) stage 3, GFR 30-59 ml/min   Hypomagnesemia   Ascites   Pneumonia   Colitis   Protein-calorie malnutrition, severe   Neonatal sepsis due to group B Streptococcus (HCC)   Antineoplastic chemotherapy induced pancytopenia (HCC)   Acute cystitis without hematuria   Hypokalemia    Time spent: 35 minutes    Bransyn Adami M.D. [facility] Pager [phone]. If 7PM-7AM, please contact night-coverage at [email or web address], password [facility] 01/20/2016, 4:01 PM  LOS: 6 days

## 2016-01-20 NOTE — Procedures (Signed)
Ultrasound-guided diagnostic and therapeutic paracentesis performed yielding 2 liters of clear, light yellow fluid. No immediate complications. The fluid was sent to the lab for preordered studies.

## 2016-01-21 LAB — COMPREHENSIVE METABOLIC PANEL
ALK PHOS: 77 U/L (ref 38–126)
ALT: 15 U/L — AB (ref 17–63)
AST: 27 U/L (ref 15–41)
Albumin: 2.4 g/dL — ABNORMAL LOW (ref 3.5–5.0)
Anion gap: 6 (ref 5–15)
BILIRUBIN TOTAL: 1 mg/dL (ref 0.3–1.2)
BUN: 11 mg/dL (ref 6–20)
CALCIUM: 7.9 mg/dL — AB (ref 8.9–10.3)
CO2: 24 mmol/L (ref 22–32)
CREATININE: 1.14 mg/dL (ref 0.61–1.24)
Chloride: 104 mmol/L (ref 101–111)
Glucose, Bld: 96 mg/dL (ref 65–99)
Potassium: 3.7 mmol/L (ref 3.5–5.1)
Sodium: 134 mmol/L — ABNORMAL LOW (ref 135–145)
Total Protein: 4.6 g/dL — ABNORMAL LOW (ref 6.5–8.1)

## 2016-01-21 LAB — GLUCOSE, CAPILLARY
GLUCOSE-CAPILLARY: 76 mg/dL (ref 65–99)
GLUCOSE-CAPILLARY: 79 mg/dL (ref 65–99)
Glucose-Capillary: 81 mg/dL (ref 65–99)
Glucose-Capillary: 90 mg/dL (ref 65–99)

## 2016-01-21 LAB — CBC
HCT: 24.4 % — ABNORMAL LOW (ref 39.0–52.0)
Hemoglobin: 8.4 g/dL — ABNORMAL LOW (ref 13.0–17.0)
MCH: 31 pg (ref 26.0–34.0)
MCHC: 34.4 g/dL (ref 30.0–36.0)
MCV: 90 fL (ref 78.0–100.0)
PLATELETS: 121 10*3/uL — AB (ref 150–400)
RBC: 2.71 MIL/uL — AB (ref 4.22–5.81)
RDW: 17.8 % — AB (ref 11.5–15.5)
WBC: 2.1 10*3/uL — AB (ref 4.0–10.5)

## 2016-01-21 LAB — GRAM STAIN

## 2016-01-21 LAB — MAGNESIUM: MAGNESIUM: 1.7 mg/dL (ref 1.7–2.4)

## 2016-01-21 MED ORDER — LEVOFLOXACIN 750 MG PO TABS: 750.0000 mg | ORAL_TABLET | Freq: Every day | ORAL | Status: DC

## 2016-01-21 MED ORDER — TIOTROPIUM BROMIDE MONOHYDRATE 18 MCG IN CAPS: 18.0000 ug | ORAL_CAPSULE | Freq: Every day | RESPIRATORY_TRACT | Status: AC

## 2016-01-21 MED ORDER — FLUTICASONE PROPIONATE 50 MCG/ACT NA SUSP: 2.0000 | Freq: Every day | NASAL | Status: AC

## 2016-01-21 MED ORDER — ALBUTEROL SULFATE HFA 108 (90 BASE) MCG/ACT IN AERS: 2.0000 | INHALATION_SPRAY | Freq: Four times a day (QID) | RESPIRATORY_TRACT | Status: AC | PRN

## 2016-01-21 MED ORDER — PANCRELIPASE (LIP-PROT-AMYL) 36000-114000 UNITS PO CPEP: 36000.0000 [IU] | ORAL_CAPSULE | Freq: Three times a day (TID) | ORAL | Status: AC

## 2016-01-21 MED ORDER — ENSURE ENLIVE PO LIQD: 237.0000 mL | Freq: Two times a day (BID) | ORAL | Status: AC

## 2016-01-21 NOTE — Progress Notes (Signed)
Nutrition Follow-up  DOCUMENTATION CODES:   Severe malnutrition in context of acute illness/injury  INTERVENTION:  - Continue to encourage PO intakes - RD will continue to monitor for needs  NUTRITION DIAGNOSIS:   Inadequate oral intake related to acute illness, nausea, vomiting as evidenced by per patient/family report. -improving  GOAL:   Patient will meet greater than or equal to 90% of their needs -likely unmet on average  MONITOR:   PO intake, Supplement acceptance, Weight trends, Labs, Skin, I & O's  ASSESSMENT:   Presented with chills while undergoing fluids infusion at cancer center. Patient has been dealing with diarrhea side effects of radiation therapy for his pancreatic cancer and has required repeated visits to oncology Center for fluids. Begin controlling his diarrhea with Imodium and was given a prescription for Lomotil. He's been having some nausea and vomiting. Started to have chills tonight his vital signs were checked while at [place] he was noted to be tachycardic and hypotensive while being on his second liter of normal saline at the clinic. Deneis any significant cough, no chest pain, no sick contacts  2/15 Per chart review, pt ate 45% breakfast/40% lunch/25% dinner yesterday (2/14) and 100% of breakfast this AM. He states that diarrhea has been decreasing in frequency and that he had 2 BMs after breakfast this AM which is a decrease in frequency since last assessment. Pt reports that taste alteration is slightly improved and that foods are tasting better. He states that 1.8 L were removed yesterday; also note that pt had paracentesis 2/9 with 3.7 L removed at that time; he states that removal of fluid has helped with appetite and intakes due to decrease of abdominal pressure.   Pt states that he is hopeful for d/c today and he denies nutrition-related questions or concerns at this time. Pt not meeting needs on average but hopeful for continued improvement  of symptoms with related increase in PO intakes. Medications reviewed. Labs reviewed; Na: 134 mmol/L, Ca: 7.9 mg/dL.   2/8 - Wife at bedside this AM.  - Per chart review, pt consumed 75% of CLD breakfast yesterday.  - Wife reports that this AM pt had jello and chicken broth and shortly after had several episodes of diarrhea.  - She states that for the past 1 month he has had nausea with associated vomiting and/or diarrhea with nearly all PO intakes.  - Pt has tried a variety of foods and drinks but has N/D with nearly all intakes.  - Wife states that these episodes began around the same time that pt began oral chemo. - She states that he also has complained of taste alteration; pt states this is a very "yucky" taste but he is unable to give further detail on this.  - Pt states that he has tolerated a baked potato with butter recently without N/D and that he also tolerated crackers with peanut butter recently although he has also had N/D with this item on other occassions. - Pt with many missing teeth and needs softer foods related to this.  - Unable to perform physical assessment at this time.  - Pt reports that since June 2016 he has lost 45-50 lbs.  - Per chart review, he has lost 7 lbs (5% body weight) in the past 1 month which is significant for time frame.   Diet Order:  Diet regular Room service appropriate?: Yes; Fluid consistency:: Thin  Skin:  Reviewed, no issues  Last BM:  2/15  Height:   Ht  Readings from Last 1 Encounters:  01/12/16 5\' 11"  (1.803 m)    Weight:   Wt Readings from Last 1 Encounters:  01/12/16 147 lb (66.679 kg)    Ideal Body Weight:  78.18 kg (kg)  BMI:  Body mass index is 20.51 kg/(m^2).  Estimated Nutritional Needs:   Kcal:  2000-2200 (30-33 kcal/kg)  Protein:  90-100 grams  Fluid:  >/= 2 L/day  EDUCATION NEEDS:   No education needs identified at this time     Jarome Matin, RD, LDN Inpatient Clinical Dietitian Pager #  [phone] After hours/weekend pager # [phone]

## 2016-01-21 NOTE — Progress Notes (Signed)
[facility]    [facility] is providing the following services: RW  If patient discharges after hours, please call [phone].   Linward Headland 01/21/2016, 8:20 AM

## 2016-01-21 NOTE — Progress Notes (Signed)
Antonio Hancock to be D/C'd Home per MD order.  Discussed prescriptions and follow up appointments with the patient. Prescriptions given to patient, medication list explained in detail. Pt verbalized understanding.    Medication List    STOP taking these medications        capecitabine 500 MG tablet  Commonly known as:  XELODA     olmesartan 40 MG tablet  Commonly known as:  BENICAR      TAKE these medications        albuterol 108 (90 Base) MCG/ACT inhaler  Commonly known as:  PROVENTIL HFA;VENTOLIN HFA  Inhale 2 puffs into the lungs every 6 (six) hours as needed for wheezing or shortness of breath.     ALPRAZolam 0.25 MG tablet  Commonly known as:  XANAX  Take 0.25 mg by mouth at bedtime.     apixaban 5 MG Tabs tablet  Commonly known as:  ELIQUIS  Take 1 tablet (5 mg total) by mouth 2 (two) times daily.     atorvastatin 10 MG tablet  Commonly known as:  LIPITOR  Take 10 mg by mouth daily.     diphenoxylate-atropine 2.5-0.025 MG tablet  Commonly known as:  LOMOTIL  Take 2 tablets by mouth 4 (four) times daily as needed for diarrhea or loose stools.     feeding supplement (ENSURE ENLIVE) Liqd  Take 237 mLs by mouth 2 (two) times daily between meals.     fluticasone 50 MCG/ACT nasal spray  Commonly known as:  FLONASE  Place 2 sprays into both nostrils daily.     levofloxacin 750 MG tablet  Commonly known as:  LEVAQUIN  Take 1 tablet (750 mg total) by mouth daily.     lipase/protease/amylase 36000 UNITS Cpep capsule  Commonly known as:  CREON  Take 1 capsule (36,000 Units total) by mouth 3 (three) times daily with meals.     megestrol 400 MG/10ML suspension  Commonly known as:  MEGACE  Take 10 mLs (400 mg total) by mouth 2 (two) times daily.     metFORMIN 500 MG 24 hr tablet  Commonly known as:  GLUCOPHAGE-XR  Take 500 mg by mouth daily.     morphine 30 MG 12 hr tablet  Commonly known as:  MS CONTIN  Take 1 tablet (30 mg total) by mouth every 12 (twelve)  hours.     ondansetron 8 MG tablet  Commonly known as:  ZOFRAN  Take 1 tablet (8 mg total) by mouth every 8 (eight) hours as needed for nausea or vomiting.     oxyCODONE 5 MG immediate release tablet  Commonly known as:  Oxy IR/ROXICODONE  Take 1 tablet (5 mg total) by mouth every 4 (four) hours as needed for moderate pain.     pantoprazole 40 MG tablet  Commonly known as:  PROTONIX  Take 40 mg by mouth daily.     promethazine 12.5 MG tablet  Commonly known as:  PHENERGAN  Take 1 tablet (12.5 mg total) by mouth every 6 (six) hours as needed for nausea or vomiting.     tiotropium 18 MCG inhalation capsule  Commonly known as:  SPIRIVA  Place 1 capsule (18 mcg total) into inhaler and inhale daily.        Filed Vitals:   01/21/16 0900 01/21/16 1339  BP: 101/54 109/60  Pulse: 72 63  Temp: 97.6 F (36.4 C) 97.8 F (36.6 C)  Resp: 20 20    Skin clean, dry and intact without evidence of  skin break down, no evidence of skin tears noted. IV catheter discontinued intact. Site without signs and symptoms of complications. Dressing and pressure applied. Pt denies pain at this time. No complaints noted.  An After Visit Summary was printed and given to the patient. Patient escorted via [place], and D/C home via private auto.  Lolita Rieger 01/21/2016 4:28 PM

## 2016-01-21 NOTE — Progress Notes (Addendum)
 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 We will plan to see him back on  for follow up. His appointment is scheduled and will print out on his AVS. Please encourage the patient to contact us prior if he has any questions about his treatments.  Shona Simpson, [facility]

## 2016-01-21 NOTE — Discharge Summary (Signed)
Antonio Hancock, is a 75 y.o. male  DOB June 27, 1941  MRN [id number].  Admission date:  01/12/2016  Admitting Physician  Toy Baker, MD  Discharge Date:  01/21/2016   Primary MD  Woody Seller, MD  Recommendations for primary care physician for things to follow:  Please follow repeat ascitic fluid cytology. Patient may need repeat paracentesis  Admission Diagnosis   Malignant neoplasm of head of pancreas (HCC) [C25.0] SIRS (systemic inflammatory response syndrome) (HCC) [R65.10] Tremor of both hands [R25.1]   Discharge Diagnosis  Malignant neoplasm of head of pancreas ([place]) [C25.0] SIRS (systemic inflammatory response syndrome) (HCC) [R65.10] Tremor of both hands [R25.1]   Principal Problem:   Severe sepsis (HCC) Active Problems:   Hypertension   Diabetes mellitus ([place])   Tobacco abuse   Pacemaker-[employer]   Atrial fibrillation (HCC)   Obstructive sleep apnea   Hyponatremia   Malignant neoplasm of head of pancreas (HCC)   Dehydration   Chronic diastolic CHF (congestive heart failure), NYHA class 1 (HCC)   CKD (chronic kidney disease) stage 3, GFR 30-59 ml/min   Hypomagnesemia   Ascites   Pneumonia   Colitis   Protein-calorie malnutrition, severe   Neonatal sepsis due to group B Streptococcus ([place])   Antineoplastic chemotherapy induced pancytopenia ([place])   Acute cystitis without hematuria   Hypokalemia      Hospital Course  Antonio Hancock is a 75 y.o. 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 Presented with chills while undergoing fluids infusion at cancer center. Patient has  been dealing with diarrhea side effects of radiation therapy for his pancreatic cancer and has required repeated visits to oncology Center for fluids. Begin controlling his diarrhea with Imodium and was given a prescription for Lomotil. He's been having some nausea and vomiting. Started to have chills tonight his vital signs were checked while at [facility] he was noted to be tachycardic and hypotensive while being on his second liter of normal saline at the clinic. Deneis any significant cough, no chest pain, no sick contacts IN ER: Patient developed fever while in emergency department up to 102.5 heart rate as high as 103 respirations up to 24. Blood pressure going systolics going down to mid 80s lactic acid unremarkable 2. UA and chest x-ray both did not show any evidence of infection. Influenza PCR was ordered Regarding pertinent past history: Patient has history of locally advanced pancreatic cancer diagnosed in November 2016 currently undergoing weekly radiation therapy status post ERCP and stent placement for biliary obstruction. currently on Xeloda. Patient has history of atrial fibrillation anticoagulated with Eliquis. He sp pacemaker due to symptomatic bradycardia. Patient used to have history of hypertension but had to discontinue all his blood pressure medications in the past secondary to recurrent hypotension in a setting of dehydration. Also had history of diabetes but given massive weight loss no longer needs  any medications Hospitalist was called for admission for persistent nausea vomiting diarrhea, resulting in dehydration with evidence of SIRS  Patient was admitted and noted to be septic with temperature 102.5, tachycardia, tachypnea, hypotension with systolic blood pressures in the 80s. Patient was also noted to have worsening loose stools. Infectious workup was concerning for possible pneumonia and urinary tract infection. Patient received a full course of antibiotic treatment. Patient  also noted to have ascites. Patient underwent a paracentesis on 01/15/2016 and cytology showed atypical cells. Patient did have a reaccumulation of ascitic fluid and is to undergo and underwent repeat ultrasound paracentesis on 01/20/2016. He will need cytology followed. Will defer follow-up of ascitic fluid cytology to oncology who helped with patient's management. Patient will discharge home today to the care of his wife to follow with oncology/radiation oncology as scheduled. He is discharged in relatively stable condition understanding the underlying pancreatic neoplasm. There is a good chance ascites will recur, especially if it is malignant.  Discharge Condition Stable.  Consults obtained  Oncology Radiation oncology  Follow UP  oncology Radiation oncology   Discharge Instructions  and  Discharge Medications  Discharge Instructions    Diet - low sodium heart healthy    Complete by:  As directed      Increase activity slowly    Complete by:  As directed             Medication List    STOP taking these medications        capecitabine 500 MG tablet  Commonly known as:  XELODA     olmesartan 40 MG tablet  Commonly known as:  BENICAR      TAKE these medications        albuterol 108 (90 Base) MCG/ACT inhaler  Commonly known as:  PROVENTIL HFA;VENTOLIN HFA  Inhale 2 puffs into the lungs every 6 (six) hours as needed for wheezing or shortness of breath.     ALPRAZolam 0.25 MG tablet  Commonly known as:  XANAX  Take 0.25 mg by mouth at bedtime.     apixaban 5 MG Tabs tablet  Commonly known as:  ELIQUIS  Take 1 tablet (5 mg total) by mouth 2 (two) times daily.     atorvastatin 10 MG tablet  Commonly known as:  LIPITOR  Take 10 mg by mouth daily.     diphenoxylate-atropine 2.5-0.025 MG tablet  Commonly known as:  LOMOTIL  Take 2 tablets by mouth 4 (four) times daily as needed for diarrhea or loose stools.     feeding supplement (ENSURE ENLIVE) Liqd  Take 237 mLs  by mouth 2 (two) times daily between meals.     fluticasone 50 MCG/ACT nasal spray  Commonly known as:  FLONASE  Place 2 sprays into both nostrils daily.     levofloxacin 750 MG tablet  Commonly known as:  LEVAQUIN  Take 1 tablet (750 mg total) by mouth daily.     lipase/protease/amylase 36000 UNITS Cpep capsule  Commonly known as:  CREON  Take 1 capsule (36,000 Units total) by mouth 3 (three) times daily with meals.     megestrol 400 MG/10ML suspension  Commonly known as:  MEGACE  Take 10 mLs (400 mg total) by mouth 2 (two) times daily.     metFORMIN 500 MG 24 hr tablet  Commonly known as:  GLUCOPHAGE-XR  Take 500 mg by mouth daily.     morphine 30 MG 12 hr tablet  Commonly known as:  MS CONTIN  Take 1 tablet (30 mg total) by mouth every 12 (twelve) hours.     ondansetron 8 MG tablet  Commonly known as:  ZOFRAN  Take 1 tablet (8 mg total) by mouth every 8 (eight) hours as needed for nausea or vomiting.     oxyCODONE 5 MG immediate release tablet  Commonly known as:  Oxy IR/ROXICODONE  Take 1 tablet (5 mg total) by mouth every 4 (four) hours as needed for moderate pain.     pantoprazole 40 MG tablet  Commonly known as:  PROTONIX  Take 40 mg by mouth daily.     promethazine 12.5 MG tablet  Commonly known as:  PHENERGAN  Take 1 tablet (12.5 mg total) by mouth every 6 (six) hours as needed for nausea or vomiting.     tiotropium 18 MCG inhalation capsule  Commonly known as:  SPIRIVA  Place 1 capsule (18 mcg total) into inhaler and inhale daily.        Diet and Activity recommendation: See Discharge Instructions above  Major procedures and Radiology Reports - PLEASE review detailed and final reports for all details, in brief -    Ct Abdomen Pelvis Wo Contrast  01/13/2016  CLINICAL DATA:  Acute onset of nausea, vomiting, chills and fever. Diarrhea, status post radiation therapy for pancreatic cancer. Initial encounter. EXAM: CT ABDOMEN AND PELVIS WITHOUT CONTRAST  TECHNIQUE: Multidetector CT imaging of the abdomen and pelvis was performed following the standard protocol without IV contrast. COMPARISON:  CT of the abdomen and pelvis from 11/14/2015 FINDINGS: Trace bilateral pleural effusions are noted, with bibasilar airspace opacity, which may reflect atelectasis or pneumonia. Pacemaker leads are partially imaged. Since the prior study, there is increased moderate volume ascites within the abdomen and pelvis. A biliary duct stent is noted in unchanged position, and is partially filled with air, with a large amount of pneumobilia noted within the liver. The spleen is mildly bulky but remains normal in size. Trace air is noted within the gallbladder. The gallbladder is difficult to fully assess given ascites. The known pancreatic head mass is difficult to fully assess without contrast. The adrenal glands are grossly unremarkable. A 3.5 cm hyperdense mass along the lateral aspect of the right kidney is mildly increased in size and may reflect a complex renal cyst or malignancy. Nonspecific perinephric stranding is noted bilaterally. There is no evidence of hydronephrosis. No renal or ureteral stones are seen. Scattered vascular calcifications are seen at both renal hila. The small bowel is grossly unremarkable in appearance. The stomach is within normal limits. No acute vascular abnormalities are seen. 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 Diffuse calcification is noted along the abdominal aorta and its branches. The appendix remains normal in caliber and contains contrast, without evidence of appendicitis. Apparent diffuse wall thickening and mucosal edema is noted along the ascending colon, which may reflect mild infectious or inflammatory colitis. Scattered diverticulosis is noted along the descending and sigmoid colon, without evidence of diverticulitis. The bladder is mildly distended and grossly unremarkable. The  prostate is enlarged, measuring 5.4 cm in transverse dimension. No inguinal lymphadenopathy is seen. No acute osseous abnormalities are identified. Thoracolumbar spinal fusion hardware is noted. There is chronic compression deformity involving vertebral body T12, with underlying chronic osseous fusion. IMPRESSION: 1. Increasing moderate volume ascites within the abdomen and pelvis. 2. Apparent diffuse wall thickening and mucosal edema along the ascending colon, which may reflect mild infectious or inflammatory colitis,  depending on the patient's symptoms. Alternatively, this may be related to the patient's ascites. 3. Trace bilateral pleural effusions, with bibasilar airspace opacity, which may reflect atelectasis or pneumonia. 4. Biliary duct stent is noted in unchanged position, with a large amount of pneumobilia noted within the liver. 5. Known pancreatic head mass difficult to fully assess without contrast. 6. 3.5 cm hyperdense mass along the lateral aspect of the right kidney has mildly increased in size and may reflect a complex renal cyst for malignancy, as previously described. 7. Diffuse calcification along the abdominal aorta and its branches. 8. 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 Diffuse calcification along the abdominal aorta and its branches. Recommend followup by ultrasound in 3 years. This recommendation follows ACR consensus guidelines: White Paper of the ACR Incidental Findings Committee II on Vascular Findings. J Am Coll Radiol 2013; 10:789-794. 9. Scattered diverticulosis along the descending and sigmoid colon, without evidence of diverticulitis. 10. Enlarged prostate noted. Electronically Signed   By: Garald Balding M.D.   On: 01/13/2016 03:18   Dg Chest 2 View  01/12/2016  CLINICAL DATA:  75 year old male with sepsis EXAM: CHEST  2 VIEW COMPARISON:  Chest CT dated 11/25/2015 FINDINGS: Two views of the chest do not demonstrate a focal  consolidation. There is no pleural effusion or pneumothorax. Bibasilar atelectatic changes most prominent on the right. The cardiac silhouette is within normal limits. Left pectoral pacemaker device. There is osteopenia with degenerative changes of the spine. Thoracolumbar fixation hardware noted. No acute osseous pathology. IMPRESSION: No active cardiopulmonary disease. Electronically Signed   By: Anner Crete M.D.   On: 01/12/2016 18:58   US Abdomen Limited  01/20/2016  CLINICAL DATA:  Ascites. EXAM: LIMITED ABDOMEN ULTRASOUND FOR ASCITES TECHNIQUE: Limited ultrasound survey for ascites was performed in all four abdominal quadrants. COMPARISON:  CT 01/13/2016 FINDINGS: Exam demonstrates moderate simple appearing ascites in all 4 quadrants. IMPRESSION: Moderate ascites. Electronically Signed   By: Marin Olp M.D.   On: 01/20/2016 13:35   US Paracentesis  01/20/2016  INDICATION: Pancreatic cancer, recurrent ascites. Request is made for diagnostic and therapeutic paracentesis. EXAM: ULTRASOUND GUIDED DIAGNOSTIC AND THERAPEUTIC PARACENTESIS MEDICATIONS: None. COMPLICATIONS: None immediate. PROCEDURE: Informed written consent was obtained from the patient after a discussion of the risks, benefits and alternatives to treatment. A timeout was performed prior to the initiation of the procedure. Initial ultrasound scanning demonstrates a small to moderate amount of ascites within the right mid to upper abdominal quadrant. The right mid to upper abdomen was prepped and draped in the usual sterile fashion. 1% lidocaine was used for local anesthesia. Following this, a Yueh catheter was introduced. An ultrasound image was saved for documentation purposes. The paracentesis was performed. The catheter was removed and a dressing was applied. The patient tolerated the procedure well without immediate post procedural complication. FINDINGS: A total of approximately 2 liters of clear, light yellow fluid was removed.  Samples were sent to the laboratory as requested by the clinical team. IMPRESSION: Successful ultrasound-guided diagnostic and therapeutic paracentesis yielding 2 liters of peritoneal fluid. Read by: Rowe Robert, PA-C Electronically Signed   By: Markus Daft M.D.   On: 01/20/2016 17:07   US Paracentesis  01/15/2016  INDICATION: History of pancreatic cancer with new onset ascites. Request has been made for diagnostic and therapeutic paracentesis EXAM: ULTRASOUND GUIDED right upper quadrant PARACENTESIS MEDICATIONS: 1% lidocaine COMPLICATIONS: None immediate. PROCEDURE: Informed written consent was obtained from the patient after  a discussion of the risks, benefits and alternatives to treatment. A timeout was performed prior to the initiation of the procedure. Initial ultrasound scanning demonstrates a moderate amount of ascites within the right upper abdominal quadrant. The right upper abdomen was prepped and draped in the usual sterile fashion. 1% lidocaine was used for local anesthesia. Following this, a Safe-T-Centesis catheter was introduced. An ultrasound image was saved for documentation purposes. The paracentesis was performed. The catheter was removed and a dressing was applied. The patient tolerated the procedure well without immediate post procedural complication. FINDINGS: A total of approximately 3.7 L of clear yellow fluid was removed. Samples were sent to the laboratory as requested by the clinical team. IMPRESSION: Successful ultrasound-guided paracentesis yielding 3.7 liters of peritoneal fluid. Read by: Saverio Danker, PA-C Electronically Signed   By: Lucrezia Europe M.D.   On: 01/15/2016 14:47    Micro Results   Recent Results (from the past 240 hour(s))  Urine culture     Status: None   Collection Time: 01/12/16  8:31 PM  Result Value Ref Range Status   Specimen Description URINE, CLEAN CATCH  Final   Special Requests NONE  Final   Culture   Final    50,000 COLONIES/mL KLEBSIELLA  PNEUMONIAE Performed at [facility]    Report Status 01/15/2016 FINAL  Final   Organism ID, Bacteria KLEBSIELLA PNEUMONIAE  Final      Susceptibility   Klebsiella pneumoniae - MIC*    AMPICILLIN >=32 RESISTANT Resistant     CEFAZOLIN <=4 SENSITIVE Sensitive     CEFTRIAXONE <=1 SENSITIVE Sensitive     CIPROFLOXACIN <=0.25 SENSITIVE Sensitive     GENTAMICIN <=1 SENSITIVE Sensitive     IMIPENEM <=0.25 SENSITIVE Sensitive     NITROFURANTOIN 64 INTERMEDIATE Intermediate     TRIMETH/SULFA <=20 SENSITIVE Sensitive     AMPICILLIN/SULBACTAM 4 SENSITIVE Sensitive     PIP/TAZO <=4 SENSITIVE Sensitive     * 50,000 COLONIES/mL KLEBSIELLA PNEUMONIAE  Blood Culture (routine x 2)     Status: None   Collection Time: 01/13/16 12:16 AM  Result Value Ref Range Status   Specimen Description BLOOD LEFT ANTECUBITAL  Final   Special Requests IN PEDIATRIC BOTTLE [place]  Final   Culture   Final    NO GROWTH 5 DAYS Performed at [facility]    Report Status 01/18/2016 FINAL  Final  Blood Culture (routine x 2)     Status: None   Collection Time: 01/13/16 12:16 AM  Result Value Ref Range Status   Specimen Description BLOOD LEFT WRIST  Final   Special Requests BOTTLES DRAWN AEROBIC AND ANAEROBIC 5CC  Final   Culture   Final    NO GROWTH 5 DAYS Performed at [facility]    Report Status 01/18/2016 FINAL  Final  Gastrointestinal Panel by PCR , Stool     Status: None   Collection Time: 01/13/16  1:23 AM  Result Value Ref Range Status   Campylobacter species NOT DETECTED NOT DETECTED Final   Plesimonas shigelloides NOT DETECTED NOT DETECTED Final   Salmonella species NOT DETECTED NOT DETECTED Final   Yersinia enterocolitica NOT DETECTED NOT DETECTED Final   Vibrio species NOT DETECTED NOT DETECTED Final   Vibrio cholerae NOT DETECTED NOT DETECTED Final   Enteroaggregative E coli (EAEC) NOT DETECTED NOT DETECTED Final   Enteropathogenic E coli (EPEC) NOT DETECTED NOT DETECTED Final    Enterotoxigenic E coli (ETEC) NOT DETECTED NOT DETECTED Final  Shiga like toxin producing E coli (STEC) NOT DETECTED NOT DETECTED Final   E. coli O157 NOT DETECTED NOT DETECTED Final   Shigella/Enteroinvasive E coli (EIEC) NOT DETECTED NOT DETECTED Final   Cryptosporidium NOT DETECTED NOT DETECTED Final   Cyclospora cayetanensis NOT DETECTED NOT DETECTED Final   Entamoeba histolytica NOT DETECTED NOT DETECTED Final   Giardia lamblia NOT DETECTED NOT DETECTED Final   Adenovirus F40/41 NOT DETECTED NOT DETECTED Final   Astrovirus NOT DETECTED NOT DETECTED Final   Norovirus GI/GII NOT DETECTED NOT DETECTED Final   Rotavirus A NOT DETECTED NOT DETECTED Final   Sapovirus (I, II, IV, and V) NOT DETECTED NOT DETECTED Final  C difficile quick scan w PCR reflex     Status: None   Collection Time: 01/13/16  1:23 AM  Result Value Ref Range Status   C Diff antigen NEGATIVE NEGATIVE Final   C Diff toxin NEGATIVE NEGATIVE Final   C Diff interpretation Negative for toxigenic C. difficile  Final  Anaerobic culture     Status: None   Collection Time: 01/15/16  2:20 PM  Result Value Ref Range Status   Specimen Description PERITONEAL  Final   Special Requests NONE  Final   Gram Stain   Final    NO WBC SEEN NO SQUAMOUS EPITHELIAL CELLS SEEN NO ORGANISMS SEEN Performed at [facility]    Culture   Final    NO ANAEROBES ISOLATED Performed at [facility]    Report Status 01/20/2016 FINAL  Final  Culture, body fluid-bottle     Status: None   Collection Time: 01/15/16  2:20 PM  Result Value Ref Range Status   Specimen Description PERITONEAL  Final   Special Requests BOTTLES DRAWN AEROBIC AND ANAEROBIC 10MLS  Final   Culture   Final    NO GROWTH 5 DAYS Performed at [facility]    Report Status 01/20/2016 FINAL  Final  Gram stain     Status: None   Collection Time: 01/15/16  2:20 PM  Result Value Ref Range Status   Specimen Description FLUID PERITONEAL  Final   Special  Requests NONE  Final   Gram Stain   Final    FEW WBC PRESENT, PREDOMINANTLY MONONUCLEAR NO ORGANISMS SEEN Performed at [facility]    Report Status 01/15/2016 FINAL  Final  Anaerobic culture     Status: None (Preliminary result)   Collection Time: 01/20/16  5:25 PM  Result Value Ref Range Status   Specimen Description ASCITIC  Final   Special Requests NONE  Final   Gram Stain   Final    NO WBC SEEN NO ORGANISMS SEEN Performed at [facility]    Culture PENDING  Incomplete   Report Status PENDING  Incomplete  Culture, body fluid-bottle     Status: None (Preliminary result)   Collection Time: 01/20/16  5:26 PM  Result Value Ref Range Status   Specimen Description FLUID ASCITIC  Final   Special Requests BOTTLES DRAWN AEROBIC AND ANAEROBIC 10CC  Final   Culture   Final    NO GROWTH < 12 HOURS Performed at [facility]    Report Status PENDING  Incomplete  Gram stain     Status: None   Collection Time: 01/20/16  5:26 PM  Result Value Ref Range Status   Specimen Description FLUID ASCITIC  Final   Special Requests NONE  Final   Gram Stain   Final    CYTOSPIN SMEAR  WBC PRESENT,BOTH PMN AND MONONUCLEAR NO ORGANISMS SEEN Performed at [facility]    Report Status 01/21/2016 FINAL  Final       Today   Subjective:   Antonio Hancock today has no headache,no chest abdominal pain,no new weakness tingling or numbness, feels much better wants to go home today.   Objective:   Blood pressure 109/60, pulse 63, temperature 97.8 F (36.6 C), temperature source Oral, resp. rate 20, height 5\' 11"  (1.803 m), weight 66.679 kg (147 lb), SpO2 100 %.   Intake/Output Summary (Last 24 hours) at 01/21/16 1528 Last data filed at 01/21/16 0900  Gross per 24 hour  Intake    360 ml  Output    150 ml  Net    210 ml    Exam Awake Alert, Oriented x 3, No new F.N deficits, Normal affect [place].AT,PERRAL Supple Neck,No JVD, No cervical lymphadenopathy appriciated.   Symmetrical Chest wall movement, Good air movement bilaterally, CTAB RRR,No Gallops,Rubs or new Murmurs, No Parasternal Heave +ve B.Sounds, Abd Soft, Non tender, No organomegaly appriciated, No rebound -guarding or rigidity. No Cyanosis, Clubbing or edema, No new Rash or bruise  Data Review   CBC w Diff: Lab Results  Component Value Date   WBC 2.1* 01/21/2016   WBC 2.6* 01/08/2016   HGB 8.4* 01/21/2016   HGB 11.1* 01/08/2016   HCT 24.4* 01/21/2016   HCT 32.8* 01/08/2016   PLT 121* 01/21/2016   PLT 187 01/08/2016   LYMPHOPCT 11 01/20/2016   LYMPHOPCT 2.3* 01/08/2016   MONOPCT 11 01/20/2016   MONOPCT 21.4* 01/08/2016   EOSPCT 10 01/20/2016   EOSPCT 2.9 01/08/2016   BASOPCT 0 01/20/2016   BASOPCT 0.2 01/08/2016    CMP: Lab Results  Component Value Date   NA 134* 01/21/2016   NA 127* 01/08/2016   K 3.7 01/21/2016   K 4.5 01/08/2016   CL 104 01/21/2016   CO2 24 01/21/2016   CO2 17* 01/08/2016   BUN 11 01/21/2016   BUN 20.1 01/08/2016   CREATININE 1.14 01/21/2016   CREATININE 1.7* 01/08/2016   PROT 4.6* 01/21/2016   PROT 6.8 01/08/2016   ALBUMIN 2.4* 01/21/2016   ALBUMIN 2.8* 01/08/2016   BILITOT 1.0 01/21/2016   BILITOT 1.51* 01/08/2016   ALKPHOS 77 01/21/2016   ALKPHOS 141 01/08/2016   AST 27 01/21/2016   AST 28 01/08/2016   ALT 15* 01/21/2016   ALT 16 01/08/2016  .   Total Time in preparing paper work, data evaluation and todays exam -  35 minutes  Charlottie Peragine M.D on 01/21/2016 at 3:28 PM  [facility] Office  [phone]

## 2016-01-21 NOTE — Progress Notes (Signed)
Occupational Therapy Treatment Patient Details Name: Antonio Hancock MRN: [id number] DOB: 1941/06/05 Today's Date: 01/21/2016    History of present illness 75 y.o. male with h/o pancreatic cancer admitted with N/V/D, ascites, sepsis. H/o HTN, DM, pacemaker, a fib, OSA, CHF.    OT comments  Pt fatigued this day stating he did not sleep well last night     Equipment Recommendations  None recommended by OT           Mobility Bed Mobility Overal bed mobility: Modified Independent             General bed mobility comments: used rail, HOB up 35*  Transfers Overall transfer level: Needs assistance Equipment used: Rolling walker (2 wheeled) Transfers: Sit to/from [facility] Sit to Stand: Supervision Stand pivot transfers: Supervision       General transfer comment: supervision for safety, no physical assist        ADL Overall ADL's : Needs assistance/impaired     Grooming: Set up;Standing                   Toilet Transfer: Supervision/safety;Ambulation;Comfort height toilet;Cueing for safety   Toileting- Clothing Manipulation and Hygiene: Supervision/safety;Sit to/from stand;Cueing for sequencing;Cueing for safety       Functional mobility during ADLs: Supervision/safety General ADL Comments: fatigue limiting pt this day. pt states he did not sleep last night                Cognition   Behavior During Therapy: WFL for tasks assessed/performed Overall Cognitive Status: Within Functional Limits for tasks assessed                               General Comments      Pertinent Vitals/ Pain       Pain Assessment: No/denies pain         Frequency       Progress Toward Goals  OT Goals(current goals can now be found in the care plan section)  Progress towards OT goals: Progressing toward goals     Plan Discharge plan remains appropriate    Co-evaluation                 End of Session     Activity  Tolerance Patient tolerated treatment well   Patient Left in bed;with call bell/phone within reach;with bed alarm set   Nurse Communication Mobility status        Time: [id number] OT Time Calculation (min): 10 min  Charges: OT General Charges $OT Visit: 1 Procedure OT Treatments $Self Care/Home Management : 8-22 mins  Jaymin Waln D 01/21/2016, 10:37 AM

## 2016-01-22 LAB — PH, BODY FLUID: pH, Body Fluid: 8.2

## 2016-01-23 ENCOUNTER — Emergency Department (HOSPITAL_COMMUNITY): Payer: BLUE CROSS/BLUE SHIELD

## 2016-01-23 ENCOUNTER — Encounter: Payer: Self-pay | Admitting: Radiation Oncology

## 2016-01-23 ENCOUNTER — Inpatient Hospital Stay (HOSPITAL_COMMUNITY)
Admission: EM | Admit: 2016-01-23 | Discharge: 2016-01-27 | DRG: 435 | Disposition: A | Discharge status: Hospice | Payer: BLUE CROSS/BLUE SHIELD | Attending: Internal Medicine | Admitting: Internal Medicine

## 2016-01-23 ENCOUNTER — Encounter (HOSPITAL_COMMUNITY): Payer: Self-pay | Admitting: *Deleted

## 2016-01-23 DIAGNOSIS — I5032 Chronic diastolic (congestive) heart failure: Secondary | ICD-10-CM | POA: Diagnosis present

## 2016-01-23 DIAGNOSIS — N183 Chronic kidney disease, stage 3 unspecified: Secondary | ICD-10-CM | POA: Diagnosis present

## 2016-01-23 DIAGNOSIS — E46 Unspecified protein-calorie malnutrition: Secondary | ICD-10-CM

## 2016-01-23 DIAGNOSIS — F17210 Nicotine dependence, cigarettes, uncomplicated: Secondary | ICD-10-CM | POA: Diagnosis present

## 2016-01-23 DIAGNOSIS — Z79899 Other long term (current) drug therapy: Secondary | ICD-10-CM

## 2016-01-23 DIAGNOSIS — Z8042 Family history of malignant neoplasm of prostate: Secondary | ICD-10-CM

## 2016-01-23 DIAGNOSIS — I441 Atrioventricular block, second degree: Secondary | ICD-10-CM | POA: Diagnosis present

## 2016-01-23 DIAGNOSIS — Z66 Do not resuscitate: Secondary | ICD-10-CM | POA: Diagnosis present

## 2016-01-23 DIAGNOSIS — Z515 Encounter for palliative care: Secondary | ICD-10-CM | POA: Insufficient documentation

## 2016-01-23 DIAGNOSIS — N179 Acute kidney failure, unspecified: Principal | ICD-10-CM | POA: Diagnosis present

## 2016-01-23 DIAGNOSIS — Z79891 Long term (current) use of opiate analgesic: Secondary | ICD-10-CM

## 2016-01-23 DIAGNOSIS — E119 Type 2 diabetes mellitus without complications: Secondary | ICD-10-CM

## 2016-01-23 DIAGNOSIS — G4733 Obstructive sleep apnea (adult) (pediatric): Secondary | ICD-10-CM | POA: Diagnosis present

## 2016-01-23 DIAGNOSIS — R188 Other ascites: Secondary | ICD-10-CM | POA: Diagnosis present

## 2016-01-23 DIAGNOSIS — C259 Malignant neoplasm of pancreas, unspecified: Secondary | ICD-10-CM

## 2016-01-23 DIAGNOSIS — Z7189 Other specified counseling: Secondary | ICD-10-CM | POA: Insufficient documentation

## 2016-01-23 DIAGNOSIS — K8689 Other specified diseases of pancreas: Secondary | ICD-10-CM | POA: Diagnosis present

## 2016-01-23 DIAGNOSIS — E785 Hyperlipidemia, unspecified: Secondary | ICD-10-CM | POA: Diagnosis present

## 2016-01-23 DIAGNOSIS — R601 Generalized edema: Secondary | ICD-10-CM | POA: Diagnosis not present

## 2016-01-23 DIAGNOSIS — M7989 Other specified soft tissue disorders: Secondary | ICD-10-CM | POA: Diagnosis not present

## 2016-01-23 DIAGNOSIS — R001 Bradycardia, unspecified: Secondary | ICD-10-CM | POA: Diagnosis present

## 2016-01-23 DIAGNOSIS — Z808 Family history of malignant neoplasm of other organs or systems: Secondary | ICD-10-CM

## 2016-01-23 DIAGNOSIS — Z7984 Long term (current) use of oral hypoglycemic drugs: Secondary | ICD-10-CM

## 2016-01-23 DIAGNOSIS — I130 Hypertensive heart and chronic kidney disease with heart failure and stage 1 through stage 4 chronic kidney disease, or unspecified chronic kidney disease: Secondary | ICD-10-CM | POA: Diagnosis present

## 2016-01-23 DIAGNOSIS — Z950 Presence of cardiac pacemaker: Secondary | ICD-10-CM

## 2016-01-23 DIAGNOSIS — Z6822 Body mass index (BMI) 22.0-22.9, adult: Secondary | ICD-10-CM

## 2016-01-23 DIAGNOSIS — I4510 Unspecified right bundle-branch block: Secondary | ICD-10-CM | POA: Diagnosis present

## 2016-01-23 DIAGNOSIS — E8809 Other disorders of plasma-protein metabolism, not elsewhere classified: Secondary | ICD-10-CM | POA: Diagnosis present

## 2016-01-23 DIAGNOSIS — R339 Retention of urine, unspecified: Secondary | ICD-10-CM | POA: Diagnosis present

## 2016-01-23 DIAGNOSIS — E871 Hypo-osmolality and hyponatremia: Secondary | ICD-10-CM | POA: Diagnosis present

## 2016-01-23 DIAGNOSIS — E869 Volume depletion, unspecified: Secondary | ICD-10-CM | POA: Diagnosis present

## 2016-01-23 DIAGNOSIS — D638 Anemia in other chronic diseases classified elsewhere: Secondary | ICD-10-CM | POA: Diagnosis present

## 2016-01-23 DIAGNOSIS — F419 Anxiety disorder, unspecified: Secondary | ICD-10-CM | POA: Diagnosis present

## 2016-01-23 DIAGNOSIS — I482 Chronic atrial fibrillation: Secondary | ICD-10-CM | POA: Diagnosis not present

## 2016-01-23 DIAGNOSIS — Z7901 Long term (current) use of anticoagulants: Secondary | ICD-10-CM

## 2016-01-23 DIAGNOSIS — E43 Unspecified severe protein-calorie malnutrition: Secondary | ICD-10-CM | POA: Diagnosis not present

## 2016-01-23 DIAGNOSIS — I4891 Unspecified atrial fibrillation: Secondary | ICD-10-CM | POA: Diagnosis present

## 2016-01-23 DIAGNOSIS — E1122 Type 2 diabetes mellitus with diabetic chronic kidney disease: Secondary | ICD-10-CM | POA: Diagnosis present

## 2016-01-23 DIAGNOSIS — H9190 Unspecified hearing loss, unspecified ear: Secondary | ICD-10-CM | POA: Diagnosis present

## 2016-01-23 DIAGNOSIS — C799 Secondary malignant neoplasm of unspecified site: Secondary | ICD-10-CM | POA: Diagnosis present

## 2016-01-23 DIAGNOSIS — I959 Hypotension, unspecified: Secondary | ICD-10-CM | POA: Diagnosis present

## 2016-01-23 DIAGNOSIS — C250 Malignant neoplasm of head of pancreas: Secondary | ICD-10-CM | POA: Diagnosis present

## 2016-01-23 DIAGNOSIS — Z833 Family history of diabetes mellitus: Secondary | ICD-10-CM

## 2016-01-23 DIAGNOSIS — Z8249 Family history of ischemic heart disease and other diseases of the circulatory system: Secondary | ICD-10-CM

## 2016-01-23 DIAGNOSIS — R6881 Early satiety: Secondary | ICD-10-CM | POA: Diagnosis present

## 2016-01-23 LAB — COMPREHENSIVE METABOLIC PANEL
ALK PHOS: 99 U/L (ref 38–126)
ALT: 17 U/L (ref 17–63)
AST: 35 U/L (ref 15–41)
Albumin: 2.8 g/dL — ABNORMAL LOW (ref 3.5–5.0)
Anion gap: 8 (ref 5–15)
BILIRUBIN TOTAL: 1.3 mg/dL — AB (ref 0.3–1.2)
BUN: 17 mg/dL (ref 6–20)
CALCIUM: 8.5 mg/dL — AB (ref 8.9–10.3)
CO2: 22 mmol/L (ref 22–32)
CREATININE: 1.78 mg/dL — AB (ref 0.61–1.24)
Chloride: 102 mmol/L (ref 101–111)
GFR, EST AFRICAN AMERICAN: 42 mL/min — AB (ref >60)
GFR, EST NON AFRICAN AMERICAN: 36 mL/min — AB (ref >60)
Glucose, Bld: 82 mg/dL (ref 65–99)
Potassium: 4.1 mmol/L (ref 3.5–5.1)
Sodium: 132 mmol/L — ABNORMAL LOW (ref 135–145)
Total Protein: 5.8 g/dL — ABNORMAL LOW (ref 6.5–8.1)

## 2016-01-23 LAB — CBC WITH DIFFERENTIAL/PLATELET
BASOS ABS: 0 10*3/uL (ref 0.0–0.1)
Basophils Relative: 0 %
EOS ABS: 0.6 10*3/uL (ref 0.0–0.7)
EOS PCT: 10 %
HCT: 32.4 % — ABNORMAL LOW (ref 39.0–52.0)
Hemoglobin: 11 g/dL — ABNORMAL LOW (ref 13.0–17.0)
LYMPHS PCT: 15 %
Lymphs Abs: 0.9 10*3/uL (ref 0.7–4.0)
MCH: 30.6 pg (ref 26.0–34.0)
MCHC: 34 g/dL (ref 30.0–36.0)
MCV: 90 fL (ref 78.0–100.0)
Monocytes Absolute: 0.7 10*3/uL (ref 0.1–1.0)
Monocytes Relative: 11 %
NEUTROS PCT: 64 %
Neutro Abs: 4.1 10*3/uL (ref 1.7–7.7)
PLATELETS: 195 10*3/uL (ref 150–400)
RBC: 3.6 MIL/uL — AB (ref 4.22–5.81)
RDW: 17.5 % — ABNORMAL HIGH (ref 11.5–15.5)
WBC: 6.3 10*3/uL (ref 4.0–10.5)

## 2016-01-23 LAB — PROTIME-INR
INR: 2.19 — ABNORMAL HIGH (ref 0.00–1.49)
Prothrombin Time: 24.2 seconds — ABNORMAL HIGH (ref 11.6–15.2)

## 2016-01-23 LAB — I-STAT CG4 LACTIC ACID, ED: LACTIC ACID, VENOUS: 2.22 mmol/L — AB (ref 0.5–2.0)

## 2016-01-23 LAB — I-STAT TROPONIN, ED: TROPONIN I, POC: 0.01 ng/mL (ref 0.00–0.08)

## 2016-01-23 LAB — BRAIN NATRIURETIC PEPTIDE: B Natriuretic Peptide: 72.7 pg/mL (ref 0.0–100.0)

## 2016-01-23 MED ORDER — ENSURE ENLIVE PO LIQD
237.0000 mL | Freq: Two times a day (BID) | ORAL | Status: DC
  Administered 2016-01-27: 237 mL via ORAL

## 2016-01-23 MED ORDER — ALBUMIN HUMAN 25 % IV SOLN
12.5000 g | Freq: Once | INTRAVENOUS | Status: AC
  Administered 2016-01-23: 12.5 g via INTRAVENOUS
  Filled 2016-01-23: qty 50

## 2016-01-23 MED ORDER — DRONABINOL 2.5 MG PO CAPS
2.5000 mg | ORAL_CAPSULE | Freq: Every day | ORAL | Status: DC
  Administered 2016-01-24 – 2016-01-27 (×4): 2.5 mg via ORAL
  Filled 2016-01-23 (×4): qty 1

## 2016-01-23 MED ORDER — ONDANSETRON HCL 4 MG/2ML IJ SOLN
4.0000 mg | Freq: Four times a day (QID) | INTRAMUSCULAR | Status: DC | PRN
  Administered 2016-01-25 – 2016-01-27 (×2): 4 mg via INTRAVENOUS
  Filled 2016-01-23 (×2): qty 2

## 2016-01-23 MED ORDER — OXYCODONE HCL 5 MG PO TABS
5.0000 mg | ORAL_TABLET | ORAL | Status: DC | PRN
Start: 2016-01-23 — End: 2016-01-27
  Administered 2016-01-23 – 2016-01-26 (×4): 5 mg via ORAL
  Filled 2016-01-23 (×4): qty 1

## 2016-01-23 MED ORDER — ACETAMINOPHEN 325 MG PO TABS: 650.0000 mg | ORAL_TABLET | Freq: Four times a day (QID) | ORAL | Status: DC | PRN

## 2016-01-23 MED ORDER — MORPHINE SULFATE ER 30 MG PO TBCR
30.0000 mg | EXTENDED_RELEASE_TABLET | Freq: Every morning | ORAL | Status: DC
  Administered 2016-01-24 – 2016-01-27 (×4): 30 mg via ORAL
  Filled 2016-01-23 (×4): qty 1

## 2016-01-23 MED ORDER — ALPRAZOLAM 0.25 MG PO TABS
0.2500 mg | ORAL_TABLET | Freq: Every day | ORAL | Status: DC
  Administered 2016-01-23 – 2016-01-26 (×4): 0.25 mg via ORAL
  Filled 2016-01-23 (×4): qty 1

## 2016-01-23 MED ORDER — INSULIN ASPART 100 UNIT/ML ~~LOC~~ SOLN: 0.0000 [IU] | Freq: Every day | SUBCUTANEOUS | Status: DC

## 2016-01-23 MED ORDER — PANCRELIPASE (LIP-PROT-AMYL) 36000-114000 UNITS PO CPEP
36000.0000 [IU] | ORAL_CAPSULE | Freq: Three times a day (TID) | ORAL | Status: DC
  Administered 2016-01-24 – 2016-01-27 (×12): 36000 [IU] via ORAL
  Filled 2016-01-23 (×13): qty 1

## 2016-01-23 MED ORDER — FLUTICASONE PROPIONATE 50 MCG/ACT NA SUSP
2.0000 | Freq: Every day | NASAL | Status: DC
  Administered 2016-01-24 – 2016-01-27 (×4): 2 via NASAL
  Filled 2016-01-23: qty 16

## 2016-01-23 MED ORDER — ACETAMINOPHEN 650 MG RE SUPP: 650.0000 mg | Freq: Four times a day (QID) | RECTAL | Status: DC | PRN

## 2016-01-23 MED ORDER — ALBUTEROL SULFATE HFA 108 (90 BASE) MCG/ACT IN AERS: 2.0000 | INHALATION_SPRAY | Freq: Four times a day (QID) | RESPIRATORY_TRACT | Status: DC | PRN

## 2016-01-23 MED ORDER — INSULIN ASPART 100 UNIT/ML ~~LOC~~ SOLN: 0.0000 [IU] | Freq: Three times a day (TID) | SUBCUTANEOUS | Status: DC

## 2016-01-23 MED ORDER — ALBUTEROL SULFATE (2.5 MG/3ML) 0.083% IN NEBU: 2.5000 mg | INHALATION_SOLUTION | Freq: Four times a day (QID) | RESPIRATORY_TRACT | Status: DC | PRN

## 2016-01-23 MED ORDER — DIPHENOXYLATE-ATROPINE 2.5-0.025 MG PO TABS
2.0000 | ORAL_TABLET | Freq: Four times a day (QID) | ORAL | Status: DC | PRN
Start: 2016-01-23 — End: 2016-01-27

## 2016-01-23 MED ORDER — ONDANSETRON HCL 4 MG PO TABS
4.0000 mg | ORAL_TABLET | Freq: Four times a day (QID) | ORAL | Status: DC | PRN
  Administered 2016-01-24 – 2016-01-27 (×4): 4 mg via ORAL
  Filled 2016-01-23 (×4): qty 1

## 2016-01-23 MED ORDER — MORPHINE SULFATE (PF) 2 MG/ML IV SOLN
2.0000 mg | INTRAVENOUS | Status: DC | PRN
  Administered 2016-01-25: 2 mg via INTRAVENOUS
  Filled 2016-01-23: qty 1

## 2016-01-23 MED ORDER — MEGESTROL ACETATE 400 MG/10ML PO SUSP
400.0000 mg | Freq: Two times a day (BID) | ORAL | Status: DC
  Administered 2016-01-23 – 2016-01-27 (×8): 400 mg via ORAL
  Filled 2016-01-23 (×9): qty 10

## 2016-01-23 MED ORDER — LEVOFLOXACIN 750 MG PO TABS
750.0000 mg | ORAL_TABLET | Freq: Every day | ORAL | Status: DC
  Administered 2016-01-24: 750 mg via ORAL
  Filled 2016-01-23: qty 1

## 2016-01-23 MED ORDER — HYDROCODONE-ACETAMINOPHEN 5-325 MG PO TABS
1.0000 | ORAL_TABLET | ORAL | Status: DC | PRN
  Administered 2016-01-25: 2 via ORAL
  Filled 2016-01-23: qty 2

## 2016-01-23 MED ORDER — PANTOPRAZOLE SODIUM 40 MG PO TBEC
40.0000 mg | DELAYED_RELEASE_TABLET | Freq: Every day | ORAL | Status: DC
  Administered 2016-01-24 – 2016-01-27 (×4): 40 mg via ORAL
  Filled 2016-01-23 (×4): qty 1

## 2016-01-23 MED ORDER — APIXABAN 5 MG PO TABS
5.0000 mg | ORAL_TABLET | Freq: Two times a day (BID) | ORAL | Status: DC
  Administered 2016-01-23 – 2016-01-24 (×2): 5 mg via ORAL
  Filled 2016-01-23 (×2): qty 1

## 2016-01-23 NOTE — ED Notes (Signed)
Called report nurse stated she will call back for report

## 2016-01-23 NOTE — ED Notes (Signed)
Bladder scan showed 214ml of urine in bladder. Offered pt urinal to obtain sample before we attempt in and out cath

## 2016-01-23 NOTE — ED Notes (Signed)
Bed: RESA Expected date:  Expected time:  Means of arrival:  Comments: Hold- OD, narcan given

## 2016-01-23 NOTE — ED Notes (Signed)
Pt's family reports pt's bila arms started to swell last night.  Swelling in his legs is not new.  Pt is A&Ox 4.  HOH.  They also reports urinary retention.  Pt also reports dizziness and nausea.

## 2016-01-23 NOTE — ED Provider Notes (Signed)
CSN: [id number]     Arrival date & time 01/23/16  1545 History   First MD Initiated Contact with Patient 01/23/16 1601     Chief Complaint  Patient presents with  . 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 Lisbeth Renshaw and Dr. Alen Blew from oncology. Patient had recent admission to the hospital for meeting of SIRS and suspected sepsis.  Patient is here today for edema in bilateral upper and lower extremities. No shortness of breath. He reports that the edema in his bilateral lower external extremities started while in the hospital. The upper extremities started to have edema in the last 2 days.  Patient denies any shortness of breath associated with this edema. He does report decreased urine output.  Patient has been having multiple paracenteses per week due to ascites from the pancreatic neoplasm.  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 History of diabetes, hypertension and tobacco use.  Past Medical History  Diagnosis Date  . Hypertension   . Diabetes mellitus     Diagnosed Fall 2012  . Mobitz type 2 second degree AV block     11/11/2011  . Tobacco abuse     1/2 ppd x 55 yrs  . Pacemaker 2012    Medtronic Adapta L pulse generator, serial number [id number]  . 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 Hyperlipidemia   . HOH (hard of hearing)   . Anxiety    Past Surgical History  Procedure Laterality Date  . Back surgery    . Permanent pacemaker insertion N/A 11/12/2011    Procedure: PERMANENT PACEMAKER INSERTION;  Surgeon: Deboraha Sprang, MD;  Location: [facility] CATH LAB;  Service: Cardiovascular;  Laterality: Left; Medtronic Adapta L pulse generator, serial number [id number]    . Hernia repair   1996    left inguinal hernia repair   . Ercp N/A 10/24/2015    Procedure: ENDOSCOPIC RETROGRADE CHOLANGIOPANCREATOGRAPHY (ERCP);  Surgeon: Carol Ada, MD;  Location: [facility] ENDOSCOPY;  Service: Endoscopy;  Laterality: N/A;  . Biliary stent placement N/A 10/24/2015    Procedure: BILIARY STENT PLACEMENT;  Surgeon: Carol Ada, MD;  Location: [facility] ENDOSCOPY;  Service: Endoscopy;  Laterality: N/A;  . Eus N/A 11/07/2015    Procedure: FULL UPPER ENDOSCOPIC ULTRASOUND (EUS) RADIAL;  Surgeon: Carol Ada, MD;  Location: [facility] ENDOSCOPY;  Service: Endoscopy;  Laterality: N/A;  . Fine needle aspiration N/A 11/07/2015    Procedure: FINE NEEDLE ASPIRATION (FNA) LINEAR;  Surgeon: Carol Ada, MD;  Location: [facility] ENDOSCOPY;  Service: Endoscopy;  Laterality: N/A;  . Ercp N/A 11/07/2015    Procedure: ENDOSCOPIC RETROGRADE CHOLANGIOPANCREATOGRAPHY (ERCP);  Surgeon: Carol Ada, MD;  Location: [facility]OS[facility];  Service: Endoscopy;  Laterality: N/A;   Family History  Problem Relation Age of Onset  . Brain cancer Mother     deceased 67  . Prostate cancer Father     deceased 74  . Deep vein thrombosis Father   . Hypertension Father   . Diabetes Father   . Heart attack Neg Hx   . Hypertension Sister   . Hypertension Brother   . Cancer Father     13  . Cancer Mother     46   Social History  Substance Use Topics  . Smoking status: Current Every Day  Smoker -- 1.00 packs/day for 50 years    Types: Cigarettes  . Smokeless tobacco: Never Used  . Alcohol Use: No    Review of Systems  Constitutional: Negative for fever and activity change.  HENT: Positive for hearing loss.   Eyes: Negative for discharge.  Respiratory: Negative for cough and shortness of breath.   Cardiovascular: Negative for chest pain.  Gastrointestinal: Negative for abdominal pain.  Genitourinary: Negative for dysuria, urgency, flank pain and penile pain.  Musculoskeletal: Negative for arthralgias.  Allergic/Immunologic: Negative for  immunocompromised state.  Neurological: Negative for seizures and speech difficulty.  Psychiatric/Behavioral: Negative for agitation.  All other systems reviewed and are negative.     Allergies  Review of patient's allergies indicates no known allergies.  Home Medications   Prior to Admission medications   Medication Sig Start Date End Date Taking? Authorizing Provider  albuterol (PROVENTIL HFA;VENTOLIN HFA) 108 (90 Base) MCG/ACT inhaler Inhale 2 puffs into the lungs every 6 (six) hours as needed for wheezing or shortness of breath. 01/21/16  Yes Simbiso Ranga, MD  ALPRAZolam (XANAX) 0.25 MG tablet Take 0.25 mg by mouth at bedtime.  11/04/13  Yes Historical Provider, MD  apixaban (ELIQUIS) 5 MG TABS tablet Take 1 tablet (5 mg total) by mouth 2 (two) times daily. 08/20/15  Yes Rhonda G Barrett, PA-C  atorvastatin (LIPITOR) 10 MG tablet Take 10 mg by mouth daily.   Yes Historical Provider, MD  diphenoxylate-atropine (LOMOTIL) 2.5-0.025 MG tablet Take 2 tablets by mouth 4 (four) times daily as needed for diarrhea or loose stools. 01/08/16  Yes Susanne Borders, NP  feeding supplement, ENSURE ENLIVE, (ENSURE ENLIVE) LIQD Take 237 mLs by mouth 2 (two) times daily between meals. 01/21/16  Yes Simbiso Ranga, MD  fluticasone (FLONASE) 50 MCG/ACT nasal spray Place 2 sprays into both nostrils daily. 01/21/16  Yes Simbiso Ranga, MD  levofloxacin (LEVAQUIN) 750 MG tablet Take 1 tablet (750 mg total) by mouth daily. 01/21/16  Yes Simbiso Ranga, MD  lipase/protease/amylase (CREON) 36000 UNITS CPEP capsule Take 1 capsule (36,000 Units total) by mouth 3 (three) times daily with meals. 01/21/16  Yes Simbiso Ranga, MD  megestrol (MEGACE) 400 MG/10ML suspension Take 10 mLs (400 mg total) by mouth 2 (two) times daily. 12/30/15  Yes Wyatt Portela, MD  metFORMIN (GLUCOPHAGE-XR) 500 MG 24 hr tablet Take 500 mg by mouth daily.    Yes Historical Provider, MD  morphine (MS CONTIN) 30 MG 12 hr tablet Take 1 tablet (30 mg  total) by mouth every 12 (twelve) hours. Patient taking differently: Take 30 mg by mouth every morning.  01/02/16  Yes Hayden Pedro, PA-C  ondansetron (ZOFRAN) 8 MG tablet Take 1 tablet (8 mg total) by mouth every 8 (eight) hours as needed for nausea or vomiting. 12/26/15  Yes Kyung Rudd, MD  oxyCODONE (OXY IR/ROXICODONE) 5 MG immediate release tablet Take 1 tablet (5 mg total) by mouth every 4 (four) hours as needed for moderate pain. 12/26/15  Yes Kyung Rudd, MD  pantoprazole (PROTONIX) 40 MG tablet Take 40 mg by mouth daily.   Yes Historical Provider, MD  promethazine (PHENERGAN) 12.5 MG tablet Take 1 tablet (12.5 mg total) by mouth every 6 (six) hours as needed for nausea or vomiting. 01/02/16  Yes Hayden Pedro, PA-C  tiotropium (SPIRIVA) 18 MCG inhalation capsule Place 1 capsule (18 mcg total) into inhaler and inhale daily. 01/21/16  Yes Simbiso Ranga, MD   BP 116/64 mmHg  Pulse 72  Temp(Src)  97.8 F (36.6 C) (Oral)  Resp 23  SpO2 96% Physical Exam  Constitutional: He is oriented to person, place, and time. He appears well-nourished.  HENT:  Head: Normocephalic.  Mouth/Throat: Oropharynx is clear and moist.  M,oist mucus membranes  Eyes: Conjunctivae are normal.  Neck: No tracheal deviation present.  Cardiovascular: Normal rate.   Pulmonary/Chest: Effort normal. No stridor. No respiratory distress.  Pacemaker in place  Abdominal: Soft. There is no tenderness. There is no guarding.  No tenderness, bandaid from recent paracentesis.l  Musculoskeletal: Normal range of motion.  + 3 edema bilateral lower and upper extremities, to knee/ midforearm  Neurological: He is oriented to person, place, and time. No cranial nerve deficit.  Skin: Skin is warm and dry. No rash noted. He is not diaphoretic.  Psychiatric: He has a normal mood and affect.  Nursing note and vitals reviewed.   ED Course  Procedures (including critical care time) Labs Review Labs Reviewed  CBC WITH  DIFFERENTIAL/PLATELET - Abnormal; Notable for the following:    RBC 3.60 (*)    Hemoglobin 11.0 (*)    HCT 32.4 (*)    RDW 17.5 (*)    All other components within normal limits  PROTIME-INR - Abnormal; Notable for the following:    Prothrombin Time 24.2 (*)    INR 2.19 (*)    All other components within normal limits  COMPREHENSIVE METABOLIC PANEL - Abnormal; Notable for the following:    Sodium 132 (*)    Creatinine, Ser 1.78 (*)    Calcium 8.5 (*)    Total Protein 5.8 (*)    Albumin 2.8 (*)    Total Bilirubin 1.3 (*)    GFR calc non Af Amer 36 (*)    GFR calc Af Amer 42 (*)    All other components within normal limits  I-STAT CG4 LACTIC ACID, ED - Abnormal; Notable for the following:    Lactic Acid, Venous 2.22 (*)    All other components within normal limits  URINE CULTURE  BRAIN NATRIURETIC PEPTIDE  URINALYSIS, ROUTINE W REFLEX MICROSCOPIC (NOT AT [facility])  Randolm Idol, ED    Imaging Review Dg Chest 2 View  01/23/2016  CLINICAL DATA:  Extremity edema. EXAM: CHEST  2 VIEW COMPARISON:  01/12/2016. FINDINGS: AP and lateral views of the chest show bibasilar atelectasis with symmetric small bilateral pleural effusions. Basilar pneumonia not completely excluded. The cardiopericardial silhouette is within normal limits for size. Bones are diffusely demineralized. Left permanent pacemaker again noted. Telemetry leads overlie the chest. Extensive thoracolumbar spinal fusion hardware again noted. IMPRESSION: New bibasilar collapse/ consolidation with small bilateral pleural effusions. Electronically Signed   By: Misty Stanley M.D.   On: 01/23/2016 17:20   I have personally reviewed and evaluated these images and lab results as part of my medical decision-making.   EKG Interpretation   Date/Time:  Friday January 23 2016 16:06:45 EST Ventricular Rate:  89 PR Interval:    QRS Duration: 132 QT Interval:  388 QTC Calculation: 472 R Axis:   -69 Text Interpretation:  Atrial  fibrillation IVCD, consider atypical RBBB  Anterior infarct, old Abnormal T, consider ischemia, lateral leads ST  elevation, consider inferior injury Baseline wander in lead(s) V2 No  significant change since last tracing Right bundle branch block Confirmed  by Gerald Leitz ([id number]) on 01/23/2016 4:13:41 PM      MDM   Final diagnoses:  Edema due to malnutrition, due to unspecified malnutrition type [facility])   patient is a  75 year old male with history of pancreatic neoplasm on radiation with recent hospitalization. He is presenting with bilateral upper extremity and lower extremity swelling. Patient has no shortness of breath. Patient's had increasing number of paracentesis for fluid. I suspect that patient has low albumin and is third spacing most of his fluids. We'll make sure the patient has no signs or symptoms of infection. Will discuss patient's care with his oncologist who may give some recommendations for better fluid management. Pt has not urinated since last night despite adequete PO intake. Currently patient's vital signs are stable and physical exam appears stable from discharge with the exception of increasing swelling in all extremities.   Talked with Dr. Marin Olp.  Agree, that inpatient managmenet of fluid status may be beneficial given the degree of fluid extrav- bilateral UE/LE and new effusions.     Tyliyah Mcmeekin Julio Alm, MD 01/23/16 2241

## 2016-01-23 NOTE — ED Notes (Signed)
Dr. Roel Cluck said if patient is retaining urine, he needs a foley.

## 2016-01-23 NOTE — H&P (Signed)
PCP:  Woody Seller, MD  [place]  Referring provider [place]   Chief Complaint:  swelling  HPI: Antonio Hancock is a 75 y.o. male   has a past medical history of Hypertension; Diabetes mellitus; Mobitz type 2 second degree AV block; Tobacco abuse; Pacemaker (2012); Chronotropic incompetence with sinus node dysfunction ([place]) (2012); Hyperlipidemia; HOH (hard of hearing); and Anxiety.   Presented with increased diffuse swelling including of lower and upper extremity as well as all of his abdomen. He's been having urinary retention has been reporting increasing nausea.  He denies any shortness of breath. The edema has started even while patient has been hospitalized recently for service but since discharge has progressed. Decreased PO intake. He have not had diarrhea for the past 36 hours whhich is chronic for him. He has urinated onlyonce today his output has been slowing.  His abdominal distention has increased.  IN ER: ER provided discuss case with oncology who agreed with plan for admission. Blood pressure was noted to be down to 109/70 with isolated reading as low 71/57 after casted elevated at 2.2 albumin 2.8 white blood cell count 6.3 hemoglobin 11 platelets 195   Regarding pertinent past history: Few weeks ago patient was admitted for SIRS Infectious workup was concerning for possible pneumonia and urinary tract infection he received a full course of antibiotic treatment.Marland Kitchen He was noted to have ascites. Patient underwent a paracentesis on 01/15/2016 and on 01/20/2016.  Cytology showed atypical cells. Patient will discharge home 2/15/17to the care of his wife to follow with oncology/radiation oncology.    Patient has been dealing with diarrhea side effects of radiation therapy for his pancreatic cancer and has  required repeated visits to oncology Center for fluids.  Patient has history of locally advanced pancreatic cancer diagnosed in November 2016 currently  undergoing weekly radiation therapy status post ERCP and stent placement for biliary obstruction. currently on Xeloda. Patient has history of atrial fibrillation anticoagulated with Eliquis. He sp pacemaker due to symptomatic bradycardia. Patient used to have history of hypertension but had to discontinue all his blood pressure medications in the past secondary to recurrent hypotension in a setting of dehydration. Also had history of diabetes but given massive weight loss no longer needs any medications.      Hospitalist was called for admission for anasarca and decreased urine output  Review of Systems:    Pertinent positives include: Abdominal distention generalized swelling, Bilateral lower extremity swelling   Constitutional:  No weight loss, night sweats, Fevers, chills, fatigue, weight loss  HEENT:  No headaches, Difficulty swallowing,Tooth/dental problems,Sore throat,  No sneezing, itching, ear ache, nasal congestion, post nasal drip,  Cardio-vascular:  No chest pain, Orthopnea, PND, anasarca, dizziness, palpitations.no  GI:  No heartburn, indigestion, abdominal pain, nausea, vomiting, diarrhea, change in bowel habits, loss of appetite, melena, blood in stool, hematemesis Resp:  no shortness of breath at rest. No dyspnea on exertion, No excess mucus, no productive cough, No non-productive cough, No coughing up of blood.No change in color of mucus.No wheezing. Skin:  no rash or lesions. No jaundice GU:  no dysuria, change in color of urine, no urgency or frequency. No straining to urinate.  No flank pain.  Musculoskeletal:  No joint pain or no joint swelling. No decreased range of motion. No back pain.  Psych:  No change in mood or affect. No depression or anxiety. No memory loss.  Neuro: no localizing neurological complaints, no tingling, no weakness, no double vision, no  gait abnormality, no slurred speech, no confusion  Otherwise ROS are negative except for above, 10 systems  were reviewed  Past Medical History: Past Medical History  Diagnosis Date  . Hypertension   . Diabetes mellitus     Diagnosed Fall 2012  . Mobitz type 2 second degree AV block     11/11/2011  . Tobacco abuse     1/2 ppd x 55 yrs  . Pacemaker 2012    Medtronic Adapta L pulse generator, serial number [id number]  . Chronotropic incompetence with sinus node dysfunction ([place]) 2012    s/p MDT PPM  . Hyperlipidemia   . HOH (hard of hearing)   . Anxiety    Past Surgical History  Procedure Laterality Date  . Back surgery    . Permanent pacemaker insertion N/A 11/12/2011    Procedure: PERMANENT PACEMAKER INSERTION;  Surgeon: Deboraha Sprang, MD;  Location: [facility] CATH LAB;  Service: Cardiovascular;  Laterality: Left; Medtronic Adapta L pulse generator, serial number [id number]    . Hernia repair  1996    left inguinal hernia repair   . Ercp N/A 10/24/2015    Procedure: ENDOSCOPIC RETROGRADE CHOLANGIOPANCREATOGRAPHY (ERCP);  Surgeon: Carol Ada, MD;  Location: [facility];  Service: Endoscopy;  Laterality: N/A;  . Biliary stent placement N/A 10/24/2015    Procedure: BILIARY STENT PLACEMENT;  Surgeon: Carol Ada, MD;  Location: [facility] ENDOSCOPY;  Service: Endoscopy;  Laterality: N/A;  . Eus N/A 11/07/2015    Procedure: FULL UPPER ENDOSCOPIC ULTRASOUND (EUS) RADIAL;  Surgeon: Carol Ada, MD;  Location: [facility] ENDOSCOPY;  Service: Endoscopy;  Laterality: N/A;  . Fine needle aspiration N/A 11/07/2015    Procedure: FINE NEEDLE ASPIRATION (FNA) LINEAR;  Surgeon: Carol Ada, MD;  Location: [facility] ENDOSCOPY;  Service: Endoscopy;  Laterality: N/A;  . Ercp N/A 11/07/2015    Procedure: ENDOSCOPIC RETROGRADE CHOLANGIOPANCREATOGRAPHY (ERCP);  Surgeon: Carol Ada, MD;  Location: [facility];  Service: Endoscopy;  Laterality: N/A;     Medications: Prior to Admission medications   Medication Sig Start Date End Date Taking? Authorizing Provider  albuterol (PROVENTIL HFA;VENTOLIN HFA) 108 (90 Base) MCG/ACT  inhaler Inhale 2 puffs into the lungs every 6 (six) hours as needed for wheezing or shortness of breath. 01/21/16  Yes Simbiso Ranga, MD  ALPRAZolam (XANAX) 0.25 MG tablet Take 0.25 mg by mouth at bedtime.  11/04/13  Yes Historical Provider, MD  apixaban (ELIQUIS) 5 MG TABS tablet Take 1 tablet (5 mg total) by mouth 2 (two) times daily. 08/20/15  Yes Rhonda G Barrett, PA-C  atorvastatin (LIPITOR) 10 MG tablet Take 10 mg by mouth daily.   Yes Historical Provider, MD  diphenoxylate-atropine (LOMOTIL) 2.5-0.025 MG tablet Take 2 tablets by mouth 4 (four) times daily as needed for diarrhea or loose stools. 01/08/16  Yes Susanne Borders, NP  feeding supplement, ENSURE ENLIVE, (ENSURE ENLIVE) LIQD Take 237 mLs by mouth 2 (two) times daily between meals. 01/21/16  Yes Simbiso Ranga, MD  fluticasone (FLONASE) 50 MCG/ACT nasal spray Place 2 sprays into both nostrils daily. 01/21/16  Yes Simbiso Ranga, MD  levofloxacin (LEVAQUIN) 750 MG tablet Take 1 tablet (750 mg total) by mouth daily. 01/21/16  Yes Simbiso Ranga, MD  lipase/protease/amylase (CREON) 36000 UNITS CPEP capsule Take 1 capsule (36,000 Units total) by mouth 3 (three) times daily with meals. 01/21/16  Yes Simbiso Ranga, MD  megestrol (MEGACE) 400 MG/10ML suspension Take 10 mLs (400 mg total) by mouth 2 (two) times daily. 12/30/15  Yes Wyatt Portela, MD  metFORMIN (GLUCOPHAGE-XR) 500 MG 24 hr tablet Take 500 mg by mouth daily.    Yes Historical Provider, MD  morphine (MS CONTIN) 30 MG 12 hr tablet Take 1 tablet (30 mg total) by mouth every 12 (twelve) hours. Patient taking differently: Take 30 mg by mouth every morning.  01/02/16  Yes Hayden Pedro, PA-C  ondansetron (ZOFRAN) 8 MG tablet Take 1 tablet (8 mg total) by mouth every 8 (eight) hours as needed for nausea or vomiting. 12/26/15  Yes Kyung Rudd, MD  oxyCODONE (OXY IR/ROXICODONE) 5 MG immediate release tablet Take 1 tablet (5 mg total) by mouth every 4 (four) hours as needed for moderate pain.  12/26/15  Yes Kyung Rudd, MD  pantoprazole (PROTONIX) 40 MG tablet Take 40 mg by mouth daily.   Yes Historical Provider, MD  promethazine (PHENERGAN) 12.5 MG tablet Take 1 tablet (12.5 mg total) by mouth every 6 (six) hours as needed for nausea or vomiting. 01/02/16  Yes Hayden Pedro, PA-C  tiotropium (SPIRIVA) 18 MCG inhalation capsule Place 1 capsule (18 mcg total) into inhaler and inhale daily. 01/21/16  Yes Simbiso Ranga, MD    Allergies:  No Known Allergies  Social History:  Ambulatory  walker   Lives at home With family     reports that he has been smoking Cigarettes.  He has a 50 pack-year smoking history. He has never used smokeless tobacco. He reports that he does not drink alcohol or use illicit drugs.     Family History: family history includes Brain cancer in his mother; Cancer in his father and mother; Deep vein thrombosis in his father; Diabetes in his father; Hypertension in his brother, father, and sister; Prostate cancer in his father. There is no history of Heart attack.    Physical Exam: Patient Vitals for the past 24 hrs:  BP Temp Temp src Pulse Resp SpO2  01/23/16 1834 109/70 mmHg - - 72 18 97 %  01/23/16 1730 102/67 mmHg - - 72 19 98 %  01/23/16 1608 107/66 mmHg 97.8 F (36.6 C) Oral 79 - 97 %  01/23/16 1553 (!) 71/57 mmHg 98.3 F (36.8 C) Oral 91 16 93 %    1. General:  in No Acute distress 2. Psychological: Alert and   Oriented 3. Head/ENT:   Moist   Mucous Membranes                          Head Non traumatic, neck supple                          Normal  Dentition 4. SKIN: normal  Skin turgor,  Skin clean Dry and intact no rash 5. Heart: Regular rate and rhythm no  Murmur, Rub or gallop 6. Lungs:  no wheezes or crackles   7. Abdomen: Soft, non-tender, distended 8. Lower extremities: no clubbing, cyanosis, 3+ edema 9. Neurologically Grossly intact, moving all 4 extremities equally 10. MSK: Normal range of motion  body mass index is unknown  because there is no weight on file.   Labs on Admission:   Results for orders placed or performed during the hospital encounter of 01/23/16 (from the past 24 hour(s))  Brain natriuretic peptide     Status: None   Collection Time: 01/23/16  4:11 PM  Result Value Ref Range   B Natriuretic Peptide 72.7 0.0 - 100.0 pg/mL  CBC with Differential/Platelet  Status: Abnormal   Collection Time: 01/23/16  4:13 PM  Result Value Ref Range   WBC 6.3 4.0 - 10.5 K/uL   RBC 3.60 (L) 4.22 - 5.81 MIL/uL   Hemoglobin 11.0 (L) 13.0 - 17.0 g/dL   HCT 32.4 (L) 39.0 - 52.0 %   MCV 90.0 78.0 - 100.0 fL   MCH 30.6 26.0 - 34.0 pg   MCHC 34.0 30.0 - 36.0 g/dL   RDW 17.5 (H) 11.5 - 15.5 %   Platelets 195 150 - 400 K/uL   Neutrophils Relative % 64 %   Neutro Abs 4.1 1.7 - 7.7 K/uL   Lymphocytes Relative 15 %   Lymphs Abs 0.9 0.7 - 4.0 K/uL   Monocytes Relative 11 %   Monocytes Absolute 0.7 0.1 - 1.0 K/uL   Eosinophils Relative 10 %   Eosinophils Absolute 0.6 0.0 - 0.7 K/uL   Basophils Relative 0 %   Basophils Absolute 0.0 0.0 - 0.1 K/uL  Protime-INR     Status: Abnormal   Collection Time: 01/23/16  4:13 PM  Result Value Ref Range   Prothrombin Time 24.2 (H) 11.6 - 15.2 seconds   INR 2.19 (H) 0.00 - 1.49  I-stat troponin, ED     Status: None   Collection Time: 01/23/16  4:25 PM  Result Value Ref Range   Troponin i, poc 0.01 0.00 - 0.08 ng/mL   Comment 3          I-Stat CG4 Lactic Acid, ED     Status: Abnormal   Collection Time: 01/23/16  4:26 PM  Result Value Ref Range   Lactic Acid, Venous 2.22 (HH) 0.5 - 2.0 mmol/L   Comment NOTIFIED PHYSICIAN   Comprehensive metabolic panel     Status: Abnormal   Collection Time: 01/23/16  4:33 PM  Result Value Ref Range   Sodium 132 (L) 135 - 145 mmol/L   Potassium 4.1 3.5 - 5.1 mmol/L   Chloride 102 101 - 111 mmol/L   CO2 22 22 - 32 mmol/L   Glucose, Bld 82 65 - 99 mg/dL   BUN 17 6 - 20 mg/dL   Creatinine, Ser 1.78 (H) 0.61 - 1.24 mg/dL   Calcium  8.5 (L) 8.9 - 10.3 mg/dL   Total Protein 5.8 (L) 6.5 - 8.1 g/dL   Albumin 2.8 (L) 3.5 - 5.0 g/dL   AST 35 15 - 41 U/L   ALT 17 17 - 63 U/L   Alkaline Phosphatase 99 38 - 126 U/L   Total Bilirubin 1.3 (H) 0.3 - 1.2 mg/dL   GFR calc non Af Amer 36 (L) >60 mL/min   GFR calc Af Amer 42 (L) >60 mL/min   Anion gap 8 5 - 15    UA ordered  Lab Results  Component Value Date   HGBA1C 5.7* 01/13/2016    Estimated Creatinine Clearance: 34.3 mL/min (by C-G formula based on Cr of 1.78).  BNP (last 3 results) No results for input(s): PROBNP in the last 8760 hours.  Other results:  I have pearsonaly reviewed this: ECG REPORT  Rate: 89  Rhythm: Atrial fibrillation ST&T Change: Abnormal ST segments but unchanged from prior QTC 472  There were no vitals filed for this visit.   Cultures:    Component Value Date/Time   SDES FLUID ASCITIC 01/20/2016 1726   SDES FLUID ASCITIC 01/20/2016 1726   SPECREQUEST BOTTLES DRAWN AEROBIC AND ANAEROBIC 10CC 01/20/2016 1726   [place] 01/20/2016 1726   CULT  01/20/2016  1726    NO GROWTH 3 DAYS Performed at [place] PENDING 01/20/2016 1726   REPTSTATUS 01/21/2016 FINAL 01/20/2016 1726     Radiological Exams on Admission: Dg Chest 2 View  01/23/2016  CLINICAL DATA:  Extremity edema. EXAM: CHEST  2 VIEW COMPARISON:  01/12/2016. FINDINGS: AP and lateral views of the chest show bibasilar atelectasis with symmetric small bilateral pleural effusions. Basilar pneumonia not completely excluded. The cardiopericardial silhouette is within normal limits for size. Bones are diffusely demineralized. Left permanent pacemaker again noted. Telemetry leads overlie the chest. Extensive thoracolumbar spinal fusion hardware again noted. IMPRESSION: New bibasilar collapse/ consolidation with small bilateral pleural effusions. Electronically Signed   By: Misty Stanley M.D.   On: 01/23/2016 17:20    Chart has been reviewed  Family   at   Bedside plan of care was discussed with Wife Antonio Hancock [phone] home  Assessment/Plan  75 year-old gentleman history of locally advanced pancreatic cancer currently on by mouth chemotherapy and radiation therapy presents with worsening generalized edema with evidence of anasarca  Present on Admission:  Generalized anasarca - likely third spacing currently continues to be hypotensive with worsening renal function and dry mouth suspect intravascularly depleted has had decreased urine output and darker urine. Overall  poor prognosis, suspect decreased albumin in the setting of severe malnutrition. This point no evidence of respiratory distress no hypoxia will apply TED hose to help with peripheral edema. Once blood pressure improves can attempt to give gentle Lasix if able will  encourage by mouth intake, discussed with oncology who recommends administration of albumin to see if patient can be potentially diuresed and fluid status can be improved.  Recurrent ascites  - currently asymptomatic if persists will likely need repeat paracentesis for symptomatic relief   . Hyponatremia - in the setting of generalized edema and acute renal failure, family is leaning towards pallitive aproach . Malignant neoplasm of head of pancreas [facility]) - oncology consult in the morning for right now hold Xeloda,  Pathology from ascites fluid has been obtained in the past but insufficient amount of cells precluding diagnosis as per oncology recommendations will obtain CA 19-9  . Pacemaker-Medtronic -stable currently appears to be stable rhythm  . Hypertension hold home blood pressure medications given soft blood pressures  . Atrial fibrillation ([place]) continue anticoagulation currently appears to be rate controlled rhythm  . Chronic diastolic CHF (congestive heart failure), NYHA class 1 (HCC) appears to be third spacing could be intravascular depleated given decreased PO intake, decreased urination and  worsening renal function. Albumin 2.8 Given hypotension will hold off on lasix for now . acute on chronic renal failure CKD (chronic kidney disease) stage 3, GFR 30-59 ml/min creatinine is up from baseline suspect intravascular depletion but third spacing resulting in anasarca, family leaning towards comfort care . Tobacco abuse recommended cessation order nicotine patch . Protein-calorie malnutrition, severe- family interested in trying Marinol will order, nutrition consult Bibasilar consolidations vs collapse due to pleural effusions - continue Levaquin, patient does not endorse fever or shortness of breath or coughing. It becomes febrile would switch to broad-spectrum antibiotics Diabetes mellitus- sesative sliding scale History of diarrhea - currently improved patient had evidence of colitis during prior admission currently diarrhea have stopped  Decreased urine output will obtain bladder scan in and out cath if  needed if evidence of urinary retention and indwelling Foley should be considered Prophylaxis: on anticoagulation  CODE STATUS:   DNR/DNI as per  patient  Family requsting palliative consult given overall poor performance status will discuss  with oncology have discussions with family regarding overall prognosis.   Disposition:    To home once workup is complete and patient is stable  Other plan as per orders.  I have spent a total of 75 min on this admission  extra time was spent to discuss case with oncology as well as family  Ruffus Kamaka 01/23/2016, 9:41 PM    [facility]  Pager [phone]   after 2 AM please page floor coverage PA If 7AM-7PM, please contact the day team taking care of the patient  [email or web address]  Password [email or web address]

## 2016-01-23 NOTE — Progress Notes (Signed)
Spoke with ED RN Fredonia Highland. She said per Dr. Roel Cluck to place foley if patient is retaining urine.  Per RN, patient was bladder scanned in ED and around 200 cc's urine in bladder. Will place order for foley.Antonio Hancock

## 2016-01-24 DIAGNOSIS — E43 Unspecified severe protein-calorie malnutrition: Secondary | ICD-10-CM | POA: Diagnosis not present

## 2016-01-24 DIAGNOSIS — C250 Malignant neoplasm of head of pancreas: Secondary | ICD-10-CM | POA: Diagnosis not present

## 2016-01-24 DIAGNOSIS — N179 Acute kidney failure, unspecified: Secondary | ICD-10-CM | POA: Diagnosis not present

## 2016-01-24 DIAGNOSIS — Z515 Encounter for palliative care: Secondary | ICD-10-CM | POA: Diagnosis not present

## 2016-01-24 DIAGNOSIS — K869 Disease of pancreas, unspecified: Secondary | ICD-10-CM

## 2016-01-24 DIAGNOSIS — Z7189 Other specified counseling: Secondary | ICD-10-CM | POA: Diagnosis not present

## 2016-01-24 DIAGNOSIS — I482 Chronic atrial fibrillation: Secondary | ICD-10-CM | POA: Diagnosis not present

## 2016-01-24 DIAGNOSIS — R601 Generalized edema: Secondary | ICD-10-CM | POA: Diagnosis not present

## 2016-01-24 LAB — GLUCOSE, CAPILLARY
GLUCOSE-CAPILLARY: 76 mg/dL (ref 65–99)
GLUCOSE-CAPILLARY: 96 mg/dL (ref 65–99)
Glucose-Capillary: 126 mg/dL — ABNORMAL HIGH (ref 65–99)
Glucose-Capillary: 68 mg/dL (ref 65–99)
Glucose-Capillary: 84 mg/dL (ref 65–99)
Glucose-Capillary: 86 mg/dL (ref 65–99)

## 2016-01-24 LAB — URINALYSIS, ROUTINE W REFLEX MICROSCOPIC
Glucose, UA: NEGATIVE mg/dL
KETONES UR: NEGATIVE mg/dL
LEUKOCYTES UA: NEGATIVE
NITRITE: NEGATIVE
Protein, ur: NEGATIVE mg/dL
SPECIFIC GRAVITY, URINE: 1.021 (ref 1.005–1.030)
pH: 5 (ref 5.0–8.0)

## 2016-01-24 LAB — COMPREHENSIVE METABOLIC PANEL
ALBUMIN: 2.3 g/dL — AB (ref 3.5–5.0)
ALK PHOS: 74 U/L (ref 38–126)
ALT: 11 U/L — ABNORMAL LOW (ref 17–63)
ANION GAP: 4 — AB (ref 5–15)
AST: 21 U/L (ref 15–41)
BUN: 19 mg/dL (ref 6–20)
CO2: 24 mmol/L (ref 22–32)
Calcium: 7.9 mg/dL — ABNORMAL LOW (ref 8.9–10.3)
Chloride: 102 mmol/L (ref 101–111)
Creatinine, Ser: 1.83 mg/dL — ABNORMAL HIGH (ref 0.61–1.24)
GFR calc Af Amer: 40 mL/min — ABNORMAL LOW (ref >60)
GFR calc non Af Amer: 35 mL/min — ABNORMAL LOW (ref >60)
GLUCOSE: 89 mg/dL (ref 65–99)
POTASSIUM: 3.8 mmol/L (ref 3.5–5.1)
SODIUM: 130 mmol/L — AB (ref 135–145)
Total Bilirubin: 0.8 mg/dL (ref 0.3–1.2)
Total Protein: 4.6 g/dL — ABNORMAL LOW (ref 6.5–8.1)

## 2016-01-24 LAB — C DIFFICILE QUICK SCREEN W PCR REFLEX
C DIFFICILE (CDIFF) INTERP: NEGATIVE
C DIFFICILE (CDIFF) TOXIN: NEGATIVE
C DIFFICLE (CDIFF) ANTIGEN: NEGATIVE

## 2016-01-24 LAB — URINE MICROSCOPIC-ADD ON

## 2016-01-24 LAB — MAGNESIUM: MAGNESIUM: 1.6 mg/dL — AB (ref 1.7–2.4)

## 2016-01-24 LAB — CBC
HEMATOCRIT: 22.6 % — AB (ref 39.0–52.0)
HEMOGLOBIN: 8.3 g/dL — AB (ref 13.0–17.0)
MCH: 32 pg (ref 26.0–34.0)
MCHC: 36.2 g/dL — AB (ref 30.0–36.0)
MCV: 88.3 fL (ref 78.0–100.0)
Platelets: 124 10*3/uL — ABNORMAL LOW (ref 150–400)
RBC: 2.56 MIL/uL — ABNORMAL LOW (ref 4.22–5.81)
RDW: 17.6 % — ABNORMAL HIGH (ref 11.5–15.5)
WBC: 2.5 10*3/uL — ABNORMAL LOW (ref 4.0–10.5)

## 2016-01-24 LAB — SODIUM, URINE, RANDOM: Sodium, Ur: <10 mmol/L

## 2016-01-24 LAB — PHOSPHORUS: Phosphorus: 2.7 mg/dL (ref 2.5–4.6)

## 2016-01-24 LAB — CREATININE, URINE, RANDOM: Creatinine, Urine: 288.61 mg/dL

## 2016-01-24 LAB — OSMOLALITY, URINE: OSMOLALITY UR: 454 mosm/kg (ref 300–900)

## 2016-01-24 LAB — PREALBUMIN: Prealbumin: 5.6 mg/dL — ABNORMAL LOW (ref 18–38)

## 2016-01-24 MED ORDER — TAMSULOSIN HCL 0.4 MG PO CAPS
0.4000 mg | ORAL_CAPSULE | Freq: Every day | ORAL | Status: DC
  Administered 2016-01-24 – 2016-01-27 (×4): 0.4 mg via ORAL
  Filled 2016-01-24 (×4): qty 1

## 2016-01-24 MED ORDER — SODIUM CHLORIDE 0.9 % IV SOLN
INTRAVENOUS | Status: DC
  Administered 2016-01-24 – 2016-01-25 (×2): via INTRAVENOUS

## 2016-01-24 MED ORDER — APIXABAN 5 MG PO TABS
5.0000 mg | ORAL_TABLET | Freq: Two times a day (BID) | ORAL | Status: DC
  Administered 2016-01-24 – 2016-01-27 (×5): 5 mg via ORAL
  Filled 2016-01-24 (×6): qty 1

## 2016-01-24 MED ORDER — MAGNESIUM SULFATE IN D5W 10-5 MG/ML-% IV SOLN
1.0000 g | Freq: Once | INTRAVENOUS | Status: AC
  Administered 2016-01-24: 1 g via INTRAVENOUS
  Filled 2016-01-24: qty 100

## 2016-01-24 MED ORDER — CHLORHEXIDINE GLUCONATE 0.12 % MT SOLN
15.0000 mL | Freq: Two times a day (BID) | OROMUCOSAL | Status: DC
  Administered 2016-01-24 – 2016-01-27 (×7): 15 mL via OROMUCOSAL
  Filled 2016-01-24 (×7): qty 15

## 2016-01-24 MED ORDER — CETYLPYRIDINIUM CHLORIDE 0.05 % MT LIQD
7.0000 mL | Freq: Two times a day (BID) | OROMUCOSAL | Status: DC
  Administered 2016-01-24 – 2016-01-27 (×8): 7 mL via OROMUCOSAL

## 2016-01-24 NOTE — Progress Notes (Signed)
Hypoglycemic Event  CBG: 68 at 2354  Treatment: 4 oz orange juice  Symptoms:none  Follow-up CBG: [id number] CBG Result:76  Possible Reasons for Event: poor appetite  Comments/MD notified:n/a hypoglycemia protocol followed.    Azzie Glatter [place]

## 2016-01-24 NOTE — Progress Notes (Signed)
Patient Demographics  Antonio Hancock, is a 75 y.o. male, DOB - 1941/08/05, [id number]  Admit date - 01/23/2016   Admitting Physician Toy Baker, MD  Outpatient Primary MD for the patient is Woody Seller, MD  LOS -    Chief Complaint  Patient presents with  . Arm Swelling         Subjective:   Antonio Hancock today has, No headache, No chest pain, No abdominal pain - No Nausea, complaints of generalized weakness  Assessment & Plan    Active Problems:   Diabetes mellitus (HCC)   Atrial fibrillation (HCC)   Obstructive sleep apnea   Hyponatremia   Pancreatic mass   Malignant neoplasm of head of pancreas (HCC)   Hypoalbuminemia due to protein-calorie malnutrition (HCC)   Chronic diastolic CHF (congestive heart failure), NYHA class 1 (HCC)   CKD (chronic kidney disease) stage 3, GFR 30-59 ml/min   Protein-calorie malnutrition, severe   Acute renal failure (ARF) (HCC)   Anasarca  Anasarca - Patient with ascites, lower extremity edema, patient reports actually his lower extremity edema is improving, status post thoracentesis during last hospitalization, evidence only of atypical cells, no evidence of SBP then. - Will start patient on nutritional supplement  Acute renal failure - We'll start on gentle hydration  Hyponatremia - Prerenal as FeNa < 1%, and urine sodium < 10,  will start on gentle hydration   Protein calorie malnutrition - We'll start on ensure  Pancreatic cancer - Patient receiving radiation treatment, been followed by Dr. Jerilynn Mages, currently Xeloda on hold  Chronic diastolic CHF - Even with anasarca, this appears to be more likely due to malnutrition and hypoalbuminemia.   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 ALPRAZolam  0.25 mg Oral QHS  . antiseptic oral rinse  7 mL Mouth Rinse q12n4p  . apixaban  5 mg Oral BID  . chlorhexidine  15 mL Mouth Rinse BID  . dronabinol  2.5 mg Oral QAC lunch  . feeding supplement (ENSURE ENLIVE)  237 mL Oral BID BM  . fluticasone  2 spray Each Nare Daily  . insulin aspart  0-5 Units Subcutaneous QHS  . insulin aspart  0-9 Units Subcutaneous TID WC  . levofloxacin  750 mg Oral Daily  . lipase/protease/amylase  36,000 Units Oral TID WC  . megestrol  400 mg Oral BID  . morphine  30 mg Oral q morning - 10a  . pantoprazole  40 mg Oral Daily   Continuous Infusions: . sodium chloride     PRN Meds:.acetaminophen **OR** acetaminophen, albuterol, diphenoxylate-atropine, HYDROcodone-acetaminophen, morphine injection, ondansetron **OR** ondansetron (ZOFRAN) IV, oxyCODONE  DVT Prophylaxis  On Eliquis  Lab Results  Component Value Date   PLT 124* 01/24/2016    Antibiotics    Anti-infectives    Start     Dose/Rate Route Frequency Ordered Stop   01/24/16 1000  levofloxacin (LEVAQUIN) tablet 750 mg  750 mg Oral Daily 01/23/16 2311            Objective:   Filed Vitals:   01/23/16 2343 01/24/16 0044 01/24/16 0600 01/24/16 0935  BP: 115/69 112/57 105/55 110/63  Pulse: 84 75 68 70  Temp: 98.3 F (36.8 C) 97.5 F (36.4 C) 97.8 F (36.6 C) 98 F (36.7 C)  TempSrc: Oral Oral Oral Oral  Resp: 20 20 20 18   Height: 5\' 11"  (1.803 m)     Weight: 73.1 kg (161 lb 2.5 oz)     SpO2: 100% 98% 99% 98%    Wt Readings from Last 3 Encounters:  01/23/16 73.1 kg (161 lb 2.5 oz)  01/12/16 66.679 kg (147 lb)  01/09/16 66.815 kg (147 lb 4.8 oz)     Intake/Output Summary (Last 24 hours) at 01/24/16 1351 Last data filed at 01/24/16 0900  Gross per 24 hour  Intake    360 ml  Output    100 ml    Net    260 ml     Physical Exam  Awake Alert, Oriented X 3, frail, chronically ill-appearing [place].AT,PERRAL Supple Neck,No JVD Symmetrical Chest wall movement, Good air movement bilaterally, No Gallops,Rubs or new Murmurs, No Parasternal Heave +ve B.Sounds, Abd Soft, No tenderness, mild ascites No Cyanosi+1 edema bilaterally    Data Review   Micro Results Recent Results (from the past 240 hour(s))  Anaerobic culture     Status: None   Collection Time: 01/15/16  2:20 PM  Result Value Ref Range Status   Specimen Description PERITONEAL  Final   Special Requests NONE  Final   Gram Stain   Final    NO WBC SEEN NO SQUAMOUS EPITHELIAL CELLS SEEN NO ORGANISMS SEEN Performed at [facility]    Culture   Final    NO ANAEROBES ISOLATED Performed at [facility]    Report Status 01/20/2016 FINAL  Final  Culture, body fluid-bottle     Status: None   Collection Time: 01/15/16  2:20 PM  Result Value Ref Range Status   Specimen Description PERITONEAL  Final   Special Requests BOTTLES DRAWN AEROBIC AND ANAEROBIC 10MLS  Final   Culture   Final    NO GROWTH 5 DAYS Performed at [facility]    Report Status 01/20/2016 FINAL  Final  Gram stain     Status: None   Collection Time: 01/15/16  2:20 PM  Result Value Ref Range Status   Specimen Description FLUID PERITONEAL  Final   Special Requests NONE  Final   Gram Stain   Final    FEW WBC PRESENT, PREDOMINANTLY MONONUCLEAR NO ORGANISMS SEEN Performed at [facility]    Report Status 01/15/2016 FINAL  Final  Anaerobic culture     Status: None (Preliminary result)   Collection Time: 01/20/16  5:25 PM  Result Value Ref Range Status   Specimen Description ASCITIC  Final   Special Requests NONE  Final   Gram Stain   Final    NO WBC SEEN NO ORGANISMS SEEN Performed at [facility]    Culture   Final    NO ANAEROBES ISOLATED; CULTURE IN PROGRESS FOR 5 DAYS Performed at [facility]     Report Status PENDING  Incomplete  Culture, body fluid-bottle     Status: None (Preliminary result)   Collection Time: 01/20/16  5:26 PM  Result Value Ref Range Status   Specimen Description FLUID ASCITIC  Final   Special  Requests BOTTLES DRAWN AEROBIC AND ANAEROBIC 10CC  Final   Culture   Final    NO GROWTH 3 DAYS Performed at [facility]    Report Status PENDING  Incomplete  Gram stain     Status: None   Collection Time: 01/20/16  5:26 PM  Result Value Ref Range Status   Specimen Description FLUID ASCITIC  Final   Special Requests NONE  Final   Gram Stain   Final    CYTOSPIN SMEAR WBC PRESENT,BOTH PMN AND MONONUCLEAR NO ORGANISMS SEEN Performed at [facility]    Report Status 01/21/2016 FINAL  Final  C difficile quick scan w PCR reflex     Status: None   Collection Time: 01/24/16 10:10 AM  Result Value Ref Range Status   C Diff antigen NEGATIVE NEGATIVE Final   C Diff toxin NEGATIVE NEGATIVE Final   C Diff interpretation Negative for toxigenic C. difficile  Final    Radiology Reports Ct Abdomen Pelvis Wo Contrast  01/13/2016  CLINICAL DATA:  Acute onset of nausea, vomiting, chills and fever. Diarrhea, status post radiation therapy for pancreatic cancer. Initial encounter. EXAM: CT ABDOMEN AND PELVIS WITHOUT CONTRAST TECHNIQUE: Multidetector CT imaging of the abdomen and pelvis was performed following the standard protocol without IV contrast. COMPARISON:  CT of the abdomen and pelvis from 11/14/2015 FINDINGS: Trace bilateral pleural effusions are noted, with bibasilar airspace opacity, which may reflect atelectasis or pneumonia. Pacemaker leads are partially imaged. Since the prior study, there is increased moderate volume ascites within the abdomen and pelvis. A biliary duct stent is noted in unchanged position, and is partially filled with air, with a large amount of pneumobilia noted within the liver. The spleen is mildly bulky but remains normal in size. Trace  air is noted within the gallbladder. The gallbladder is difficult to fully assess given ascites. The known pancreatic head mass is difficult to fully assess without contrast. The adrenal glands are grossly unremarkable. A 3.5 cm hyperdense mass along the lateral aspect of the right kidney is mildly increased in size and may reflect a complex renal cyst or malignancy. Nonspecific perinephric stranding is noted bilaterally. There is no evidence of hydronephrosis. No renal or ureteral stones are seen. Scattered vascular calcifications are seen at both renal hila. The small bowel is grossly unremarkable in appearance. The stomach is within normal limits. No acute vascular abnormalities are seen. 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 Diffuse calcification is noted along the abdominal aorta and its branches. The appendix remains normal in caliber and contains contrast, without evidence of appendicitis. Apparent diffuse wall thickening and mucosal edema is noted along the ascending colon, which may reflect mild infectious or inflammatory colitis. Scattered diverticulosis is noted along the descending and sigmoid colon, without evidence of diverticulitis. The bladder is mildly distended and grossly unremarkable. The prostate is enlarged, measuring 5.4 cm in transverse dimension. No inguinal lymphadenopathy is seen. No acute osseous abnormalities are identified. Thoracolumbar spinal fusion hardware is noted. There is chronic compression deformity involving vertebral body T12, with underlying chronic osseous fusion. IMPRESSION: 1. Increasing moderate volume ascites within the abdomen and pelvis. 2. Apparent diffuse wall thickening and mucosal edema along the ascending colon, which may reflect mild infectious or inflammatory colitis, depending on the patient's symptoms. Alternatively, this may be related to the patient's ascites. 3. Trace bilateral pleural effusions,  with bibasilar airspace opacity, which may reflect atelectasis or pneumonia. 4. Biliary  duct stent is noted in unchanged position, with a large amount of pneumobilia noted within the liver. 5. Known pancreatic head mass difficult to fully assess without contrast. 6. 3.5 cm hyperdense mass along the lateral aspect of the right kidney has mildly increased in size and may reflect a complex renal cyst for malignancy, as previously described. 7. Diffuse calcification along the abdominal aorta and its branches. 8. 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 Diffuse calcification along the abdominal aorta and its branches. Recommend followup by ultrasound in 3 years. This recommendation follows ACR consensus guidelines: White Paper of the ACR Incidental Findings Committee II on Vascular Findings. J Am Coll Radiol 2013; 10:789-794. 9. Scattered diverticulosis along the descending and sigmoid colon, without evidence of diverticulitis. 10. Enlarged prostate noted. Electronically Signed   By: Garald Balding M.D.   On: 01/13/2016 03:18   Dg Chest 2 View  01/23/2016  CLINICAL DATA:  Extremity edema. EXAM: CHEST  2 VIEW COMPARISON:  01/12/2016. FINDINGS: AP and lateral views of the chest show bibasilar atelectasis with symmetric small bilateral pleural effusions. Basilar pneumonia not completely excluded. The cardiopericardial silhouette is within normal limits for size. Bones are diffusely demineralized. Left permanent pacemaker again noted. Telemetry leads overlie the chest. Extensive thoracolumbar spinal fusion hardware again noted. IMPRESSION: New bibasilar collapse/ consolidation with small bilateral pleural effusions. Electronically Signed   By: Misty Stanley M.D.   On: 01/23/2016 17:20   Dg Chest 2 View  01/12/2016  CLINICAL DATA:  75 year old male with sepsis EXAM: CHEST  2 VIEW COMPARISON:  Chest CT dated 11/25/2015 FINDINGS: Two views of the chest do not demonstrate a  focal consolidation. There is no pleural effusion or pneumothorax. Bibasilar atelectatic changes most prominent on the right. The cardiac silhouette is within normal limits. Left pectoral pacemaker device. There is osteopenia with degenerative changes of the spine. Thoracolumbar fixation hardware noted. No acute osseous pathology. IMPRESSION: No active cardiopulmonary disease. Electronically Signed   By: Anner Crete M.D.   On: 01/12/2016 18:58   US Abdomen Limited  01/20/2016  CLINICAL DATA:  Ascites. EXAM: LIMITED ABDOMEN ULTRASOUND FOR ASCITES TECHNIQUE: Limited ultrasound survey for ascites was performed in all four abdominal quadrants. COMPARISON:  CT 01/13/2016 FINDINGS: Exam demonstrates moderate simple appearing ascites in all 4 quadrants. IMPRESSION: Moderate ascites. Electronically Signed   By: Marin Olp M.D.   On: 01/20/2016 13:35   US Paracentesis  01/20/2016  INDICATION: Pancreatic cancer, recurrent ascites. Request is made for diagnostic and therapeutic paracentesis. EXAM: ULTRASOUND GUIDED DIAGNOSTIC AND THERAPEUTIC PARACENTESIS MEDICATIONS: None. COMPLICATIONS: None immediate. PROCEDURE: Informed written consent was obtained from the patient after a discussion of the risks, benefits and alternatives to treatment. A timeout was performed prior to the initiation of the procedure. Initial ultrasound scanning demonstrates a small to moderate amount of ascites within the right mid to upper abdominal quadrant. The right mid to upper abdomen was prepped and draped in the usual sterile fashion. 1% lidocaine was used for local anesthesia. Following this, a Yueh catheter was introduced. An ultrasound image was saved for documentation purposes. The paracentesis was performed. The catheter was removed and a dressing was applied. The patient tolerated the procedure well without immediate post procedural complication. FINDINGS: A total of approximately 2 liters of clear, light yellow fluid was  removed. Samples were sent to the laboratory as requested by the clinical team. IMPRESSION: Successful ultrasound-guided diagnostic and therapeutic paracentesis yielding 2 liters of peritoneal fluid.  Read by: Rowe Robert, PA-C Electronically Signed   By: Markus Daft M.D.   On: 01/20/2016 17:07   US Paracentesis  01/15/2016  INDICATION: History of pancreatic cancer with new onset ascites. Request has been made for diagnostic and therapeutic paracentesis EXAM: ULTRASOUND GUIDED right upper quadrant PARACENTESIS MEDICATIONS: 1% lidocaine COMPLICATIONS: None immediate. PROCEDURE: Informed written consent was obtained from the patient after a discussion of the risks, benefits and alternatives to treatment. A timeout was performed prior to the initiation of the procedure. Initial ultrasound scanning demonstrates a moderate amount of ascites within the right upper abdominal quadrant. The right upper abdomen was prepped and draped in the usual sterile fashion. 1% lidocaine was used for local anesthesia. Following this, a Safe-T-Centesis catheter was introduced. An ultrasound image was saved for documentation purposes. The paracentesis was performed. The catheter was removed and a dressing was applied. The patient tolerated the procedure well without immediate post procedural complication. FINDINGS: A total of approximately 3.7 L of clear yellow fluid was removed. Samples were sent to the laboratory as requested by the clinical team. IMPRESSION: Successful ultrasound-guided paracentesis yielding 3.7 liters of peritoneal fluid. Read by: Saverio Danker, PA-C Electronically Signed   By: Lucrezia Europe M.D.   On: 01/15/2016 14:47     CBC  Recent Labs Lab 01/19/16 0630 01/20/16 0559 01/21/16 0529 01/23/16 1613 01/24/16 0741  WBC 4.2 3.2* 2.1* 6.3 2.5*  HGB 8.6* 8.5* 8.4* 11.0* 8.3*  HCT 24.3* 24.4* 24.4* 32.4* 22.6*  PLT 123* 119* 121* 195 124*  MCV 88.7 89.1 90.0 90.0 88.3  MCH 31.4 31.0 31.0 30.6 32.0  MCHC 35.4  34.8 34.4 34.0 36.2*  RDW 17.7* 17.9* 17.8* 17.5* 17.6*  LYMPHSABS  --  0.3*  --  0.9  --   MONOABS  --  0.4  --  0.7  --   EOSABS  --  0.3  --  0.6  --   BASOSABS  --  0.0  --  0.0  --     Chemistries   Recent Labs Lab 01/18/16 0619 01/19/16 0630 01/20/16 0559 01/21/16 0529 01/23/16 1633 01/24/16 0741  NA 136 135 134* 134* 132* 130*  K 3.8 3.1* 3.8 3.7 4.1 3.8  CL 111 106 105 104 102 102  CO2 20* 22 23 24 22 24   GLUCOSE 84 92 84 96 82 89  BUN 7 9 10 11 17 19   CREATININE 1.15 1.18 1.09 1.14 1.78* 1.83*  CALCIUM 7.7* 7.5* 7.7* 7.9* 8.5* 7.9*  MG 1.7 1.8  --  1.7  --  1.6*  AST  --   --   --  27 35 21  ALT  --   --   --  15* 17 11*  ALKPHOS  --   --   --  77 99 74  BILITOT  --   --   --  1.0 1.3* 0.8   ------------------------------------------------------------------------------------------------------------------ estimated creatinine clearance is 36.6 mL/min (by C-G formula based on Cr of 1.83). ------------------------------------------------------------------------------------------------------------------ No results for input(s): HGBA1C in the last 72 hours. ------------------------------------------------------------------------------------------------------------------ No results for input(s): CHOL, HDL, LDLCALC, TRIG, CHOLHDL, LDLDIRECT in the last 72 hours. ------------------------------------------------------------------------------------------------------------------ No results for input(s): TSH, T4TOTAL, T3FREE, THYROIDAB in the last 72 hours.  Invalid input(s): FREET3 ------------------------------------------------------------------------------------------------------------------ No results for input(s): VITAMINB12, FOLATE, FERRITIN, TIBC, IRON, RETICCTPCT in the last 72 hours.  Coagulation profile  Recent Labs Lab 01/23/16 1613  INR 2.19*    No results for input(s): DDIMER in the last 72 hours.  Cardiac Enzymes No results for input(s): CKMB,  TROPONINI, MYOGLOBIN in the last 168 hours.  Invalid input(s): CK ------------------------------------------------------------------------------------------------------------------ Invalid input(s): POCBNP     Time Spent in minutes   30 minutes   Hesper Venturella M.D on 01/24/2016 at 1:51 PM  Between 7am to 7pm - Pager - [phone]  After 7pm go to [email or web address] - password [facility]  [facility]   Office  [phone]

## 2016-01-24 NOTE — Consult Note (Signed)
Consultation Note Date: 01/24/2016   Patient Name: Antonio Hancock  DOB: 1941/07/29  MRN: [id number]  Age / Sex: 75 y.o., male  PCP: Christain Sacramento, MD Referring Physician: Albertine Patricia, MD  Reason for Consultation: Establishing goals of care  Clinical Assessment/Narrative: Patient is a 75 year old male with history of hypertension, diabetes, secondary AV block, tobacco abuse, hyperlipidemia, anxiety, and pancreatic cancer that was diagnosed in November 2016. He has been undergoing radiation therapy and is status post ERCP and stent placement for biliary obstruction. He has also been taking Xeloda but this is currently on hold. He is admitted after presenting with increased diffuse swelling of the extremities and abdomen in conjunction with urinary retention and increasing nausea. Palliative consulted for goals of care  Contacts/Participants in Discussion: Patient, his son, his niece, and his 2 sisters. Primary Decision Maker: Patient Relationship to Patient self HCPOA: None on chart  SUMMARY OF RECOMMENDATIONS - We discussed that the purpose of further disease modifying therapy would be to add time and quality to his life. He wants to continue with current therapies and discuss possibility for any further disease modifying therapy as well as the risk and benefits that that would entail with the oncology team. - We began discussion that when we reach a point where disease modifying therapy is not likely to be beneficial, this does not mean that there is not care left to give, but rather means that there care that we provide needs to be focused on feeling as well as possible for as long as possible. - He and his family would like to have the opportunity to discuss with oncology and are agreeable to me following up on Monday to continue conversation.  His wife is not present at this time and he would like me to follow-up  whenever she is available. - Continue current therapy for now. He is DO NOT RESUSCITATE/DO NOT INTUBATE in the event of acute decompensation.  Code Status/Advance Care Planning: DNR    Code Status Orders        Start     Ordered   01/23/16 2312  Do not attempt resuscitation (DNR)   Continuous    Question Answer Comment  In the event of cardiac or respiratory ARREST Do not call a "code blue"   In the event of cardiac or respiratory ARREST Do not perform Intubation, CPR, defibrillation or ACLS   In the event of cardiac or respiratory ARREST Use medication by any route, position, wound care, and other measures to relive pain and suffering. May use oxygen, suction and manual treatment of airway obstruction as needed for comfort.      01/23/16 2311    Code Status History    Date Active Date Inactive Code Status Order ID Comments User Context   01/13/2016  1:21 AM 01/21/2016  7:35 PM DNR [id number]  Toy Baker, MD Inpatient   10/23/2015 12:23 PM 10/25/2015  2:19 PM Full Code [id number]  Theodis Blaze, MD Inpatient     Symptom Management:   Currently reports pain and nausea are well controlled. Continue same.  Palliative Prophylaxis:   Aspiration, Bowel Regimen, Delirium Protocol and Frequent Pain Assessment  Psycho-social/Spiritual:  Support System: Strong   Prognosis: Likely weeks to months  Discharge Planning: To be determined   Chief Complaint/ Primary Diagnoses: Present on Admission:  . Protein-calorie malnutrition, severe . Pancreatic mass . Hypoalbuminemia due to protein-calorie malnutrition ([place]) . Malignant neoplasm of head of pancreas ([place]) . CKD (chronic kidney disease)  stage 3, GFR 30-59 ml/min . Chronic diastolic CHF (congestive heart failure), NYHA class 1 ([employer]) . Atrial fibrillation ([place]) . Hyponatremia . Obstructive sleep apnea . Acute renal failure (ARF) ([place]) . Anasarca  I have reviewed the medical record, interviewed the patient and family, and  examined the patient. The following aspects are pertinent.  Past Medical History  Diagnosis Date  . Hypertension   . Diabetes mellitus     Diagnosed Fall 2012  . Mobitz type 2 second degree AV block     11/11/2011  . Tobacco abuse     1/2 ppd x 55 yrs  . Pacemaker 2012    Medtronic Adapta L pulse generator, serial number [id number]  . 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 Hyperlipidemia   . HOH (hard of hearing)   . Anxiety    Social History   Social History  . Marital Status: Married    Spouse Name: N/A  . Number of Children: N/A  . Years of Education: N/A   Social History Main Topics  . Smoking status: Current Every Day Smoker -- 1.00 packs/day for 50 years    Types: Cigarettes  . Smokeless tobacco: Never Used  . Alcohol Use: No  . Drug Use: No  . Sexual Activity: No   Other Topics Concern  . None   Social History Narrative   Gambles on the Internet   Married, wife Verdis Frederickson   Family History  Problem Relation Age of Onset  . Brain cancer Mother     deceased 76  . Prostate cancer Father     deceased 6  . Deep vein thrombosis Father   . Hypertension Father   . Diabetes Father   . Heart attack Neg Hx   . Hypertension Sister   . Hypertension Brother   . Cancer Father     48  . Cancer Mother     58   Scheduled Meds: . ALPRAZolam  0.25 mg Oral QHS  . antiseptic oral rinse  7 mL Mouth Rinse q12n4p  . apixaban  5 mg Oral BID  . chlorhexidine  15 mL Mouth Rinse BID  . dronabinol  2.5 mg Oral QAC lunch  . feeding supplement (ENSURE ENLIVE)  237 mL Oral BID BM  . fluticasone  2 spray Each Nare Daily  . insulin aspart  0-5 Units Subcutaneous QHS  . insulin aspart  0-9 Units Subcutaneous TID WC  . lipase/protease/amylase  36,000 Units Oral TID WC  . megestrol  400 mg Oral BID  . morphine  30 mg Oral q morning - 10a  . pantoprazole  40 mg Oral Daily  . tamsulosin  0.4 mg Oral Daily   Continuous Infusions: .  sodium chloride 50 mL/hr at 01/24/16 1420   PRN Meds:.acetaminophen **OR** acetaminophen, albuterol, diphenoxylate-atropine, HYDROcodone-acetaminophen, morphine injection, ondansetron **OR** ondansetron (ZOFRAN) IV, oxyCODONE Medications Prior to Admission:  Prior to Admission medications   Medication Sig Start Date End Date Taking? Authorizing Provider  albuterol (PROVENTIL HFA;VENTOLIN HFA) 108 (90 Base) MCG/ACT inhaler Inhale 2 puffs into the lungs every 6 (six) hours as needed for wheezing or shortness of breath. 01/21/16  Yes Simbiso Ranga, MD  ALPRAZolam (XANAX) 0.25 MG tablet Take 0.25 mg by mouth at bedtime.  11/04/13  Yes Historical Provider, MD  apixaban (ELIQUIS) 5 MG TABS tablet Take 1 tablet (5 mg total) by mouth 2 (two) times daily. 08/20/15  Yes Rhonda G Barrett, PA-C  atorvastatin (  LIPITOR) 10 MG tablet Take 10 mg by mouth daily.   Yes Historical Provider, MD  diphenoxylate-atropine (LOMOTIL) 2.5-0.025 MG tablet Take 2 tablets by mouth 4 (four) times daily as needed for diarrhea or loose stools. 01/08/16  Yes Susanne Borders, NP  feeding supplement, ENSURE ENLIVE, (ENSURE ENLIVE) LIQD Take 237 mLs by mouth 2 (two) times daily between meals. 01/21/16  Yes Simbiso Ranga, MD  fluticasone (FLONASE) 50 MCG/ACT nasal spray Place 2 sprays into both nostrils daily. 01/21/16  Yes Simbiso Ranga, MD  levofloxacin (LEVAQUIN) 750 MG tablet Take 1 tablet (750 mg total) by mouth daily. 01/21/16  Yes Simbiso Ranga, MD  lipase/protease/amylase (CREON) 36000 UNITS CPEP capsule Take 1 capsule (36,000 Units total) by mouth 3 (three) times daily with meals. 01/21/16  Yes Simbiso Ranga, MD  megestrol (MEGACE) 400 MG/10ML suspension Take 10 mLs (400 mg total) by mouth 2 (two) times daily. 12/30/15  Yes Wyatt Portela, MD  metFORMIN (GLUCOPHAGE-XR) 500 MG 24 hr tablet Take 500 mg by mouth daily.    Yes Historical Provider, MD  morphine (MS CONTIN) 30 MG 12 hr tablet Take 1 tablet (30 mg total) by mouth every 12  (twelve) hours. Patient taking differently: Take 30 mg by mouth every morning.  01/02/16  Yes Hayden Pedro, PA-C  ondansetron (ZOFRAN) 8 MG tablet Take 1 tablet (8 mg total) by mouth every 8 (eight) hours as needed for nausea or vomiting. 12/26/15  Yes Kyung Rudd, MD  oxyCODONE (OXY IR/ROXICODONE) 5 MG immediate release tablet Take 1 tablet (5 mg total) by mouth every 4 (four) hours as needed for moderate pain. 12/26/15  Yes Kyung Rudd, MD  pantoprazole (PROTONIX) 40 MG tablet Take 40 mg by mouth daily.   Yes Historical Provider, MD  promethazine (PHENERGAN) 12.5 MG tablet Take 1 tablet (12.5 mg total) by mouth every 6 (six) hours as needed for nausea or vomiting. 01/02/16  Yes Hayden Pedro, PA-C  tiotropium (SPIRIVA) 18 MCG inhalation capsule Place 1 capsule (18 mcg total) into inhaler and inhale daily. 01/21/16  Yes Simbiso Ranga, MD   No Known Allergies  Review of Systems  Physical Exam  General: Alert, awake, in no acute distress.  HEENT: No bruits, no goiter, no JVD Heart: Regular rate and rhythm. No murmur appreciated. Lungs: Good air movement, clear Abdomen: Soft, mildly tender, distended, positive bowel sounds.  Ext: Noted edema Skin: Warm and dry Neuro: Grossly intact, nonfocal.  Vital Signs: BP 116/71 mmHg  Pulse 77  Temp(Src) 98.4 F (36.9 C) (Oral)  Resp 16  Ht 5\' 11"  (1.803 m)  Wt 71.8 kg (158 lb 4.6 oz)  BMI 22.09 kg/m2  SpO2 95%  SpO2: SpO2: 95 % O2 Device:SpO2: 95 % O2 Flow Rate: .   IO: Intake/output summary:  Intake/Output Summary (Last 24 hours) at 01/24/16 2142 Last data filed at 01/24/16 1858  Gross per 24 hour  Intake    690 ml  Output    300 ml  Net    390 ml    LBM: Last BM Date: 01/22/16 Baseline Weight: Weight: 73.1 kg (161 lb 2.5 oz) Most recent weight: Weight: 71.8 kg (158 lb 4.6 oz)      Palliative Assessment/Data:  Flowsheet Rows        Most Recent Value   Intake Tab    Referral Department  Hospitalist   Unit at Time  of Referral  Oncology Unit   Palliative Care Primary Diagnosis  Cancer   Date Notified  01/23/16   Palliative Care Type  New Palliative care   Reason for referral  Non-pain Symptom, Clarify Goals of Care   Date of Admission  01/23/16   Date first seen by Palliative Care  01/24/16   # of days Palliative referral response time  1 Day(s)   # of days IP prior to Palliative referral  0   Clinical Assessment    Palliative Performance Scale Score  50%   Pain Max last 24 hours  3   Pain Min Last 24 hours  0   Psychosocial & Spiritual Assessment    Palliative Care Outcomes    Patient/Family meeting held?  Yes   Who was at the meeting?  Patient, his knees, his 2 sisters, and one son   Palliative Care Outcomes  Provided advance care planning      Additional Data Reviewed:  CBC:    Component Value Date/Time   WBC 2.5* 01/24/2016 0741   WBC 2.6* 01/08/2016 1139   HGB 8.3* 01/24/2016 0741   HGB 11.1* 01/08/2016 1139   HCT 22.6* 01/24/2016 0741   HCT 32.8* 01/08/2016 1139   PLT 124* 01/24/2016 0741   PLT 187 01/08/2016 1139   MCV 88.3 01/24/2016 0741   MCV 91.2 01/08/2016 1139   NEUTROABS 4.1 01/23/2016 1613   NEUTROABS 1.9 01/08/2016 1139   LYMPHSABS 0.9 01/23/2016 1613   LYMPHSABS 0.1* 01/08/2016 1139   MONOABS 0.7 01/23/2016 1613   MONOABS 0.6 01/08/2016 1139   EOSABS 0.6 01/23/2016 1613   EOSABS 0.1 01/08/2016 1139   BASOSABS 0.0 01/23/2016 1613   BASOSABS 0.0 01/08/2016 1139   Comprehensive Metabolic Panel:    Component Value Date/Time   NA 130* 01/24/2016 0741   NA 127* 01/08/2016 1139   K 3.8 01/24/2016 0741   K 4.5 01/08/2016 1139   CL 102 01/24/2016 0741   CO2 24 01/24/2016 0741   CO2 17* 01/08/2016 1139   BUN 19 01/24/2016 0741   BUN 20.1 01/08/2016 1139   CREATININE 1.83* 01/24/2016 0741   CREATININE 1.7* 01/08/2016 1139   GLUCOSE 89 01/24/2016 0741   GLUCOSE 90 01/08/2016 1139   CALCIUM 7.9* 01/24/2016 0741   CALCIUM 8.6 01/08/2016 1139   AST 21  01/24/2016 0741   AST 28 01/08/2016 1139   ALT 11* 01/24/2016 0741   ALT 16 01/08/2016 1139   ALKPHOS 74 01/24/2016 0741   ALKPHOS 141 01/08/2016 1139   BILITOT 0.8 01/24/2016 0741   BILITOT 1.51* 01/08/2016 1139   PROT 4.6* 01/24/2016 0741   PROT 6.8 01/08/2016 1139   ALBUMIN 2.3* 01/24/2016 0741   ALBUMIN 2.8* 01/08/2016 1139     Time In: 1200  Time Out: 1300 Time Total: 60 Greater than 50%  of this time was spent counseling and coordinating care related to the above assessment and plan.  Signed by: Micheline Rough, MD  Micheline Rough, MD  01/24/2016, 9:42 PM  Please contact Palliative Medicine Team phone at [phone] for questions and concerns.

## 2016-01-25 DIAGNOSIS — C799 Secondary malignant neoplasm of unspecified site: Secondary | ICD-10-CM | POA: Diagnosis present

## 2016-01-25 DIAGNOSIS — I482 Chronic atrial fibrillation: Secondary | ICD-10-CM | POA: Diagnosis not present

## 2016-01-25 DIAGNOSIS — Z79891 Long term (current) use of opiate analgesic: Secondary | ICD-10-CM | POA: Diagnosis not present

## 2016-01-25 DIAGNOSIS — Z79899 Other long term (current) drug therapy: Secondary | ICD-10-CM | POA: Diagnosis not present

## 2016-01-25 DIAGNOSIS — E1122 Type 2 diabetes mellitus with diabetic chronic kidney disease: Secondary | ICD-10-CM | POA: Diagnosis present

## 2016-01-25 DIAGNOSIS — Z6822 Body mass index (BMI) 22.0-22.9, adult: Secondary | ICD-10-CM | POA: Diagnosis not present

## 2016-01-25 DIAGNOSIS — R6881 Early satiety: Secondary | ICD-10-CM | POA: Diagnosis present

## 2016-01-25 DIAGNOSIS — M7989 Other specified soft tissue disorders: Secondary | ICD-10-CM | POA: Diagnosis present

## 2016-01-25 DIAGNOSIS — F419 Anxiety disorder, unspecified: Secondary | ICD-10-CM | POA: Diagnosis present

## 2016-01-25 DIAGNOSIS — Z7901 Long term (current) use of anticoagulants: Secondary | ICD-10-CM | POA: Diagnosis not present

## 2016-01-25 DIAGNOSIS — E871 Hypo-osmolality and hyponatremia: Secondary | ICD-10-CM | POA: Diagnosis present

## 2016-01-25 DIAGNOSIS — I959 Hypotension, unspecified: Secondary | ICD-10-CM | POA: Diagnosis present

## 2016-01-25 DIAGNOSIS — I441 Atrioventricular block, second degree: Secondary | ICD-10-CM | POA: Diagnosis present

## 2016-01-25 DIAGNOSIS — Z8042 Family history of malignant neoplasm of prostate: Secondary | ICD-10-CM | POA: Diagnosis not present

## 2016-01-25 DIAGNOSIS — I4510 Unspecified right bundle-branch block: Secondary | ICD-10-CM | POA: Diagnosis present

## 2016-01-25 DIAGNOSIS — R188 Other ascites: Secondary | ICD-10-CM | POA: Diagnosis present

## 2016-01-25 DIAGNOSIS — R601 Generalized edema: Secondary | ICD-10-CM | POA: Diagnosis not present

## 2016-01-25 DIAGNOSIS — E785 Hyperlipidemia, unspecified: Secondary | ICD-10-CM | POA: Diagnosis present

## 2016-01-25 DIAGNOSIS — Z808 Family history of malignant neoplasm of other organs or systems: Secondary | ICD-10-CM | POA: Diagnosis not present

## 2016-01-25 DIAGNOSIS — Z7984 Long term (current) use of oral hypoglycemic drugs: Secondary | ICD-10-CM | POA: Diagnosis not present

## 2016-01-25 DIAGNOSIS — Z8249 Family history of ischemic heart disease and other diseases of the circulatory system: Secondary | ICD-10-CM | POA: Diagnosis not present

## 2016-01-25 DIAGNOSIS — N179 Acute kidney failure, unspecified: Secondary | ICD-10-CM | POA: Diagnosis present

## 2016-01-25 DIAGNOSIS — H9190 Unspecified hearing loss, unspecified ear: Secondary | ICD-10-CM | POA: Diagnosis present

## 2016-01-25 DIAGNOSIS — F17210 Nicotine dependence, cigarettes, uncomplicated: Secondary | ICD-10-CM | POA: Diagnosis present

## 2016-01-25 DIAGNOSIS — E869 Volume depletion, unspecified: Secondary | ICD-10-CM | POA: Diagnosis present

## 2016-01-25 DIAGNOSIS — Z7189 Other specified counseling: Secondary | ICD-10-CM | POA: Diagnosis not present

## 2016-01-25 DIAGNOSIS — N183 Chronic kidney disease, stage 3 (moderate): Secondary | ICD-10-CM | POA: Diagnosis present

## 2016-01-25 DIAGNOSIS — I4891 Unspecified atrial fibrillation: Secondary | ICD-10-CM | POA: Diagnosis present

## 2016-01-25 DIAGNOSIS — C250 Malignant neoplasm of head of pancreas: Secondary | ICD-10-CM | POA: Diagnosis not present

## 2016-01-25 DIAGNOSIS — I130 Hypertensive heart and chronic kidney disease with heart failure and stage 1 through stage 4 chronic kidney disease, or unspecified chronic kidney disease: Secondary | ICD-10-CM | POA: Diagnosis present

## 2016-01-25 DIAGNOSIS — D638 Anemia in other chronic diseases classified elsewhere: Secondary | ICD-10-CM | POA: Diagnosis present

## 2016-01-25 DIAGNOSIS — I5032 Chronic diastolic (congestive) heart failure: Secondary | ICD-10-CM | POA: Diagnosis present

## 2016-01-25 DIAGNOSIS — Z950 Presence of cardiac pacemaker: Secondary | ICD-10-CM | POA: Diagnosis not present

## 2016-01-25 DIAGNOSIS — E8809 Other disorders of plasma-protein metabolism, not elsewhere classified: Secondary | ICD-10-CM | POA: Diagnosis present

## 2016-01-25 DIAGNOSIS — Z66 Do not resuscitate: Secondary | ICD-10-CM | POA: Diagnosis present

## 2016-01-25 DIAGNOSIS — R001 Bradycardia, unspecified: Secondary | ICD-10-CM | POA: Diagnosis present

## 2016-01-25 DIAGNOSIS — Z833 Family history of diabetes mellitus: Secondary | ICD-10-CM | POA: Diagnosis not present

## 2016-01-25 DIAGNOSIS — Z515 Encounter for palliative care: Secondary | ICD-10-CM | POA: Diagnosis not present

## 2016-01-25 DIAGNOSIS — R339 Retention of urine, unspecified: Secondary | ICD-10-CM | POA: Diagnosis present

## 2016-01-25 DIAGNOSIS — E43 Unspecified severe protein-calorie malnutrition: Secondary | ICD-10-CM | POA: Diagnosis not present

## 2016-01-25 DIAGNOSIS — G4733 Obstructive sleep apnea (adult) (pediatric): Secondary | ICD-10-CM | POA: Diagnosis present

## 2016-01-25 DIAGNOSIS — R1900 Intra-abdominal and pelvic swelling, mass and lump, unspecified site: Secondary | ICD-10-CM | POA: Diagnosis not present

## 2016-01-25 LAB — URINE CULTURE: Culture: NO GROWTH

## 2016-01-25 LAB — GLUCOSE, CAPILLARY
GLUCOSE-CAPILLARY: 84 mg/dL (ref 65–99)
GLUCOSE-CAPILLARY: 119 mg/dL — AB (ref 65–99)
Glucose-Capillary: 104 mg/dL — ABNORMAL HIGH (ref 65–99)
Glucose-Capillary: 88 mg/dL (ref 65–99)

## 2016-01-25 LAB — CBC
HCT: 24.9 % — ABNORMAL LOW (ref 39.0–52.0)
Hemoglobin: 8.7 g/dL — ABNORMAL LOW (ref 13.0–17.0)
MCH: 31.1 pg (ref 26.0–34.0)
MCHC: 34.9 g/dL (ref 30.0–36.0)
MCV: 88.9 fL (ref 78.0–100.0)
PLATELETS: 120 10*3/uL — AB (ref 150–400)
RBC: 2.8 MIL/uL — ABNORMAL LOW (ref 4.22–5.81)
RDW: 17.2 % — ABNORMAL HIGH (ref 11.5–15.5)
WBC: 3.5 10*3/uL — ABNORMAL LOW (ref 4.0–10.5)

## 2016-01-25 LAB — ANAEROBIC CULTURE: GRAM STAIN: NONE SEEN

## 2016-01-25 LAB — BASIC METABOLIC PANEL
ANION GAP: 6 (ref 5–15)
BUN: 20 mg/dL (ref 6–20)
CHLORIDE: 101 mmol/L (ref 101–111)
CO2: 23 mmol/L (ref 22–32)
Calcium: 7.9 mg/dL — ABNORMAL LOW (ref 8.9–10.3)
Creatinine, Ser: 2.11 mg/dL — ABNORMAL HIGH (ref 0.61–1.24)
GFR calc Af Amer: 34 mL/min — ABNORMAL LOW (ref >60)
GFR, EST NON AFRICAN AMERICAN: 29 mL/min — AB (ref >60)
Glucose, Bld: 87 mg/dL (ref 65–99)
POTASSIUM: 3.8 mmol/L (ref 3.5–5.1)
SODIUM: 130 mmol/L — AB (ref 135–145)

## 2016-01-25 LAB — CULTURE, BODY FLUID W GRAM STAIN -BOTTLE: Culture: NO GROWTH

## 2016-01-25 LAB — CANCER ANTIGEN 19-9: CA 19 9: 86 U/mL — AB (ref 0–35)

## 2016-01-25 LAB — PREPARE RBC (CROSSMATCH)

## 2016-01-25 LAB — CULTURE, BODY FLUID-BOTTLE

## 2016-01-25 IMAGING — CT CT ABDOMEN W/O CM
3 of 4 series · 13 of 36 positions shown, 19 images · IV contrast (OMNI 300/WATER)
Comparison: CT abdomen 10/23/2015

CLINICAL DATA: Evaluate stent placement. Patient has pancreatic
cancer.

EXAM:
CT ABDOMEN WITHOUT CONTRAST
TECHNIQUE: Multidetector CT imaging of the abdomen was performed following the
standard protocol without IV contrast.

[Series 3: abdomen w/o · axial · non-contrast · 0.78mm/px · z∈[-212,-12]mm · 6 of 56 slices shown, 11 images]
[im 8/56  soft-tissue]
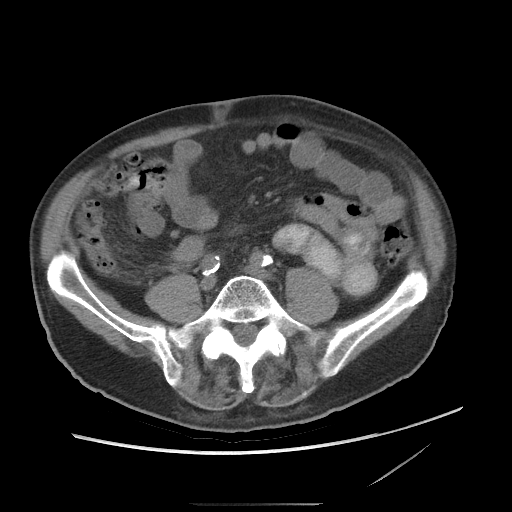
[im 8/56  bone]
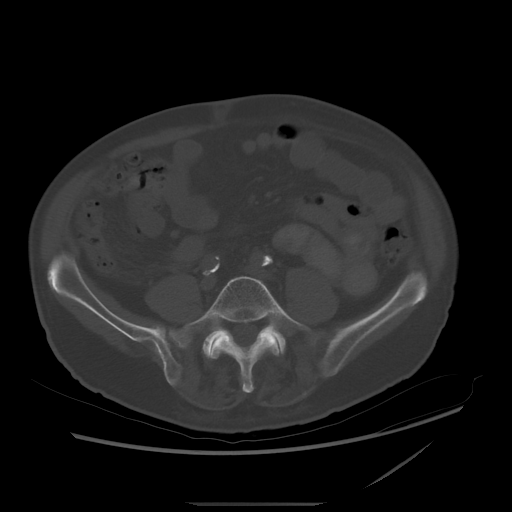
[im 16/56  soft-tissue]
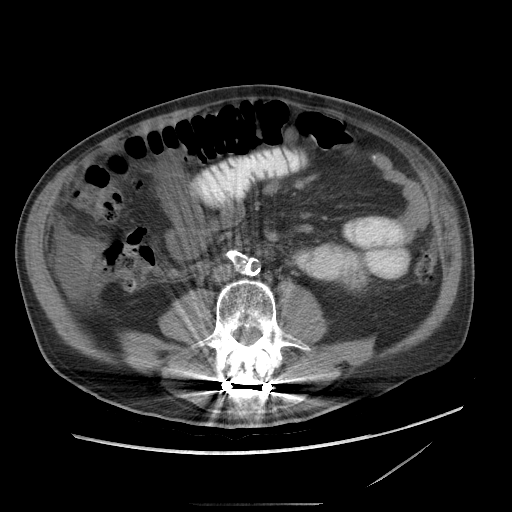
[im 24/56  soft-tissue]
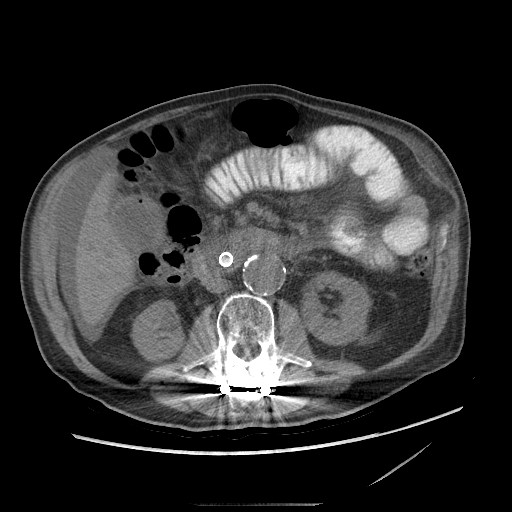
[im 24/56  lung]
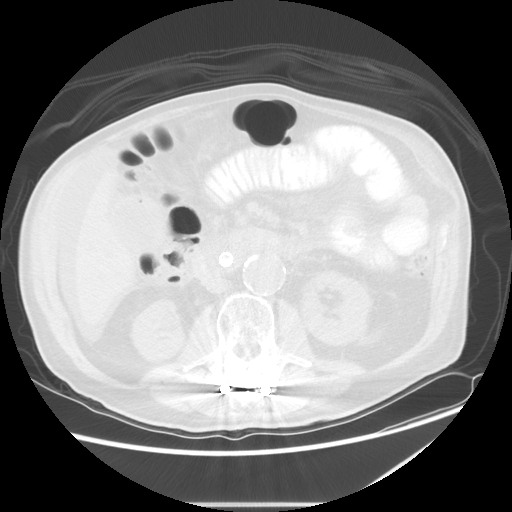
[im 32/56  soft-tissue]
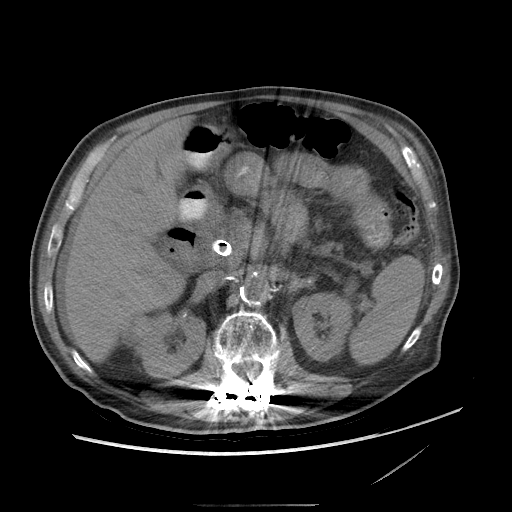
[im 32/56  lung]
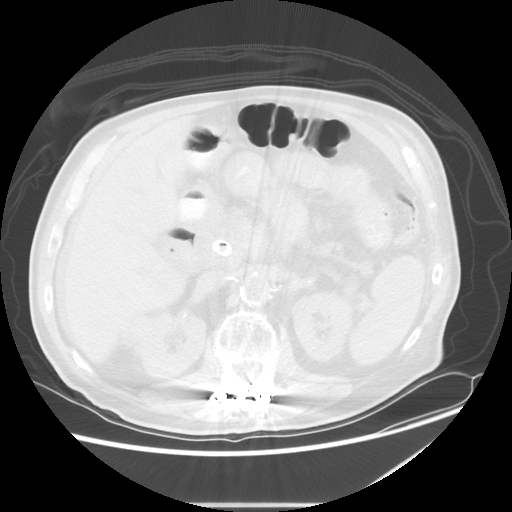
[im 40/56  soft-tissue]
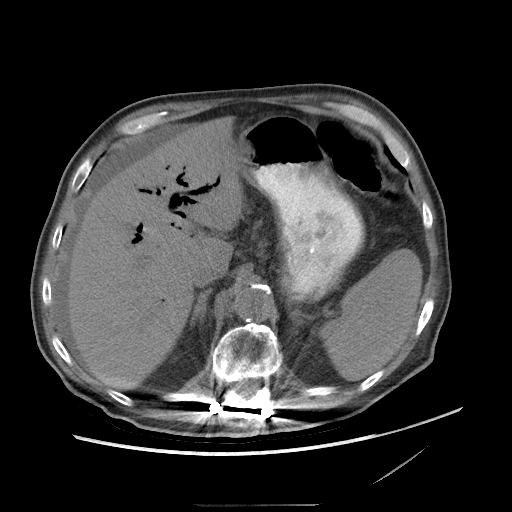
[im 40/56  lung]
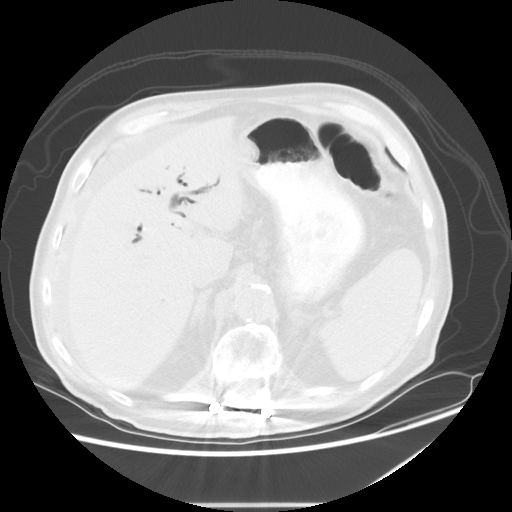
[im 48/56  soft-tissue]
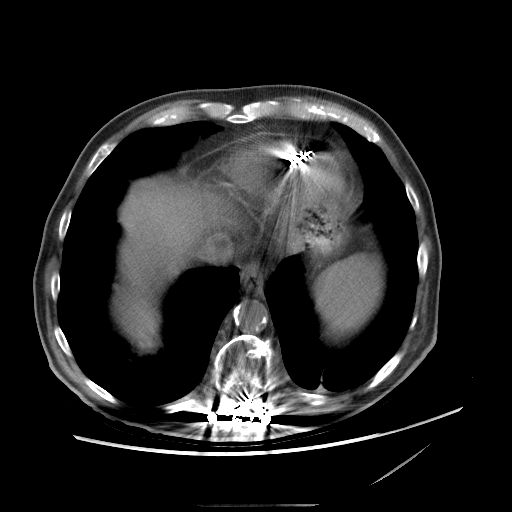
[im 48/56  lung]
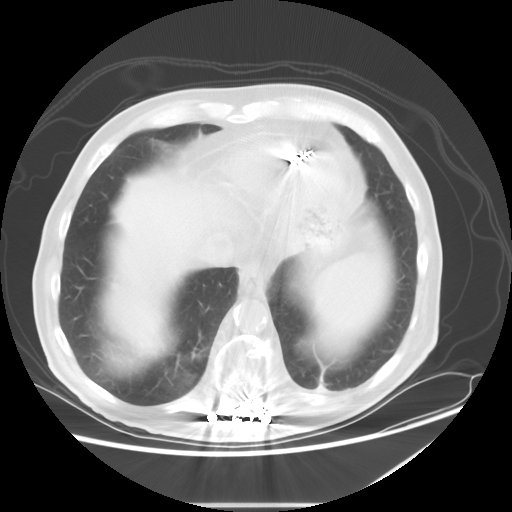

[Series 601: coronal body · coronal · 0.78mm/px · 1 of 119 slices shown, 2 images]
[im 40/119  soft-tissue]
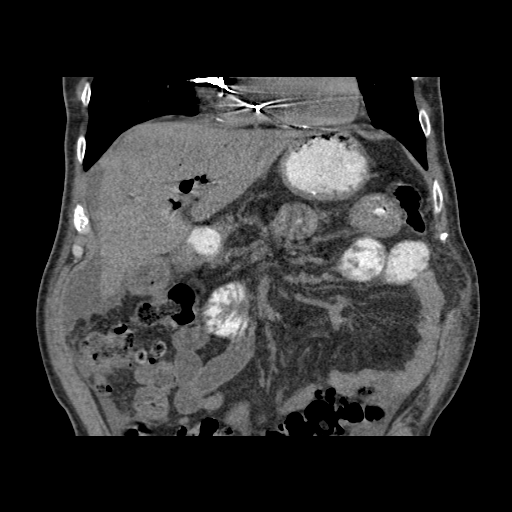
[im 40/119  bone]
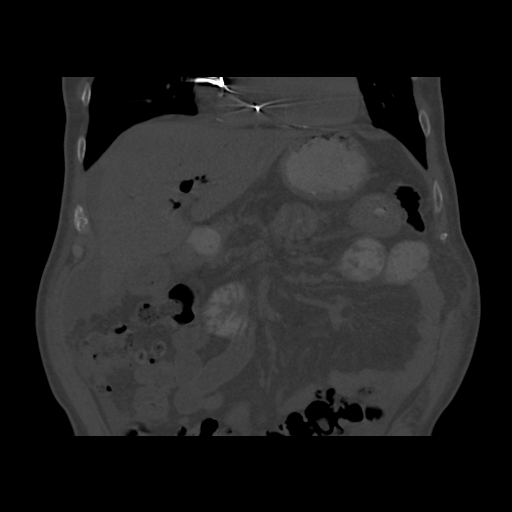

[Series 602: sagittal body · sagittal · 0.78mm/px · 6 of 144 slices shown]
[im 16/144  soft-tissue]
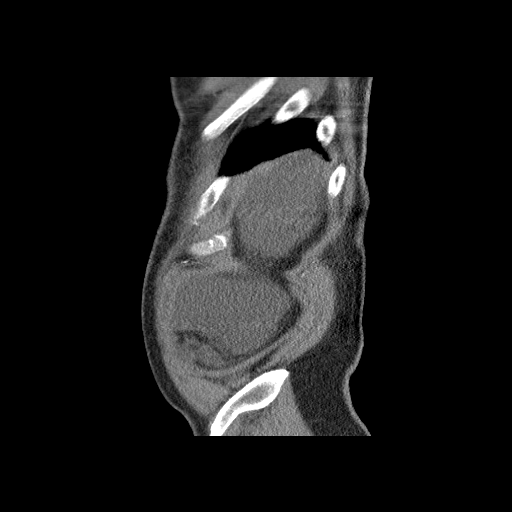
[im 31/144  soft-tissue]
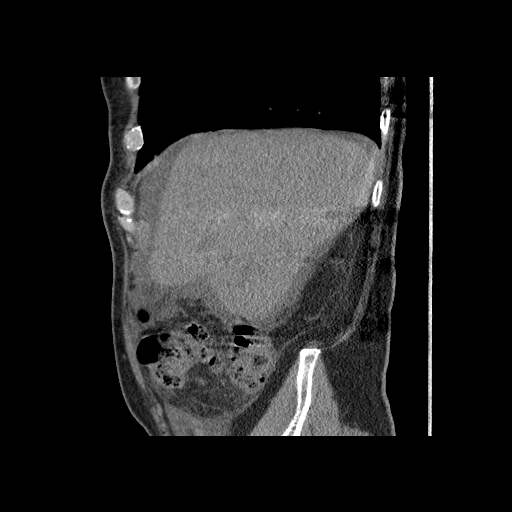
[im 46/144  soft-tissue]
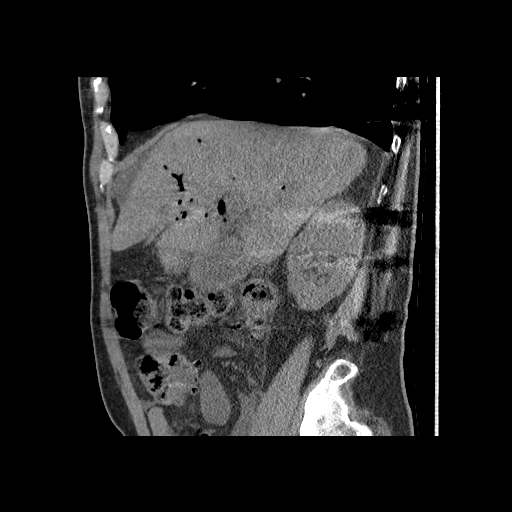
[im 61/144  soft-tissue]
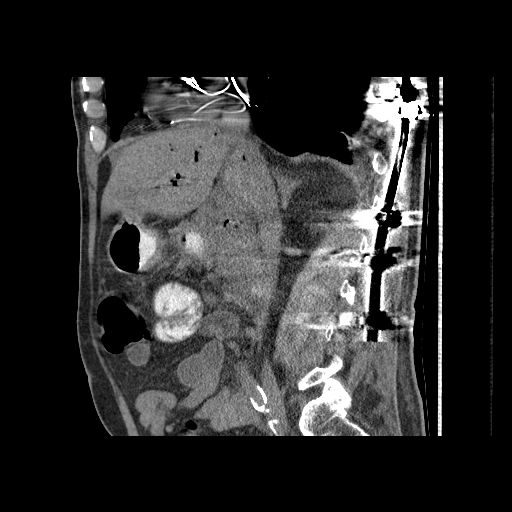
[im 83/144  soft-tissue]
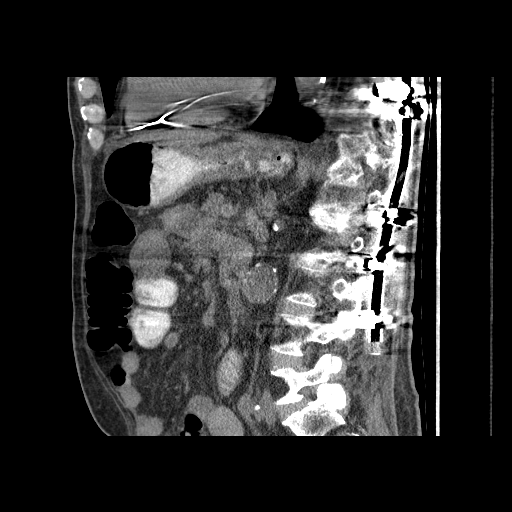
[im 98/144  soft-tissue]
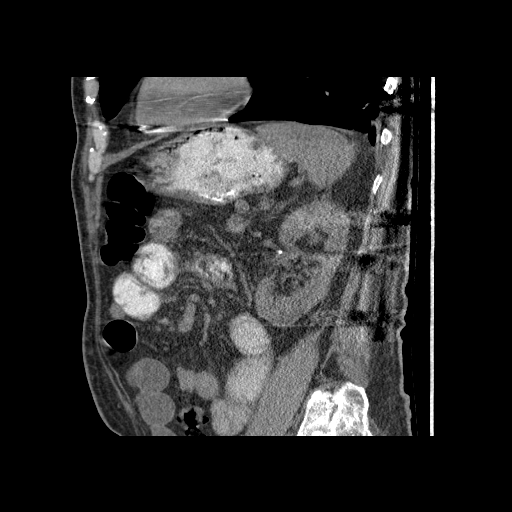

[13 of 36 positions shown; findings below may reference images not displayed]

FINDINGS: A biliary stent has been placed across the pancreatic mass.
Pneumobilia is present. Improved intrahepatic ductal dilatation.
Mild perihepatic ascites. Moderate gallbladder distention. No
significant change in pancreatic head mass. Mildly prominent small
bowel loops suggesting ascites. Previous lumbar instrumentation for
T12 compression fracture. Pacemaker. No lung base nodules.
IMPRESSION: Satisfactory biliary stent placement. Improved intrahepatic biliary
ductal dilatation. Pneumobilia.

## 2016-01-25 MED ORDER — SODIUM CHLORIDE 0.9 % IV SOLN
Freq: Once | INTRAVENOUS | Status: AC
  Administered 2016-01-25: 16:00:00 via INTRAVENOUS

## 2016-01-25 MED ORDER — ZOLPIDEM TARTRATE 5 MG PO TABS
5.0000 mg | ORAL_TABLET | Freq: Every evening | ORAL | Status: DC | PRN
  Administered 2016-01-25 – 2016-01-26 (×2): 5 mg via ORAL
  Filled 2016-01-25 (×2): qty 1

## 2016-01-25 MED ORDER — NICOTINE 21 MG/24HR TD PT24
21.0000 mg | MEDICATED_PATCH | Freq: Every day | TRANSDERMAL | Status: DC
  Administered 2016-01-25 – 2016-01-27 (×3): 21 mg via TRANSDERMAL
  Filled 2016-01-25 (×3): qty 1

## 2016-01-25 MED ORDER — ALBUMIN HUMAN 25 % IV SOLN
25.0000 g | Freq: Four times a day (QID) | INTRAVENOUS | Status: AC
  Administered 2016-01-25 – 2016-01-26 (×6): 25 g via INTRAVENOUS
  Filled 2016-01-25 (×6): qty 100

## 2016-01-25 NOTE — Progress Notes (Addendum)
Patient Demographics  Antonio Hancock, is a 75 y.o. male, DOB - 09-13-1941, YO:[id number]  Admit date - 01/23/2016   Admitting Physician Toy Baker, MD  Outpatient Primary MD for the patient is Woody Seller, MD  LOS -    Chief Complaint  Patient presents with  . Arm Swelling         Subjective:   Antonio Hancock today has, No headache, No chest pain, No abdominal pain - No Nausea, complaints of generalized weakness, and poor night sleep.  Assessment & Plan    Active Problems:   Diabetes mellitus (HCC)   Atrial fibrillation (HCC)   Obstructive sleep apnea   Hyponatremia   Pancreatic mass   Malignant neoplasm of head of pancreas (HCC)   Hypoalbuminemia due to protein-calorie malnutrition (HCC)   Chronic diastolic CHF (congestive heart failure), NYHA class 1 (HCC)   CKD (chronic kidney disease) stage 3, GFR 30-59 ml/min   Protein-calorie malnutrition, severe   Acute renal failure (ARF) ([place])   Anasarca  Anasarca - Patient with ascites, lower extremity edema,  status post pparacentesis during last hospitalization, evidence only of atypical cells, no evidence of SBP then. - this is most likely related to hypoalbuminemia and severe malnutrition  Acute renal failure - This is prerenal in the setting of low urinary sodium and FeNa <1, and this is most likely due to third spacingfrom malnutrition and hypoalbuminemia, discussed with Dr. Florene Glen from nephrology, try to give colloid including albumin and 1 unit PRBC transfusion.  Hyponatremia - due to volume depletion as FeNa < 1%, and urine sodium < 10.  severeProtein calorie malnutrition/hypoalbuminemia - We'll start on ensure - we'll give albumin transfusion  Pancreatic cancer - Patient receiving radiation treatment, been followed by Dr. Jerilynn Mages, currently Xeloda on hold  Chronic diastolic CHF - Even with anasarca, this  appears to be more likely due to malnutrition and hypoalbuminemia.   diabetes mellitus  - Continue with SSI   Diarrhea - Chronic, C. difficile negative  Urinary retention - We'll start on Flomax, will attempt a voiding trial today.    Code Status: DNR  Family Communication: Discussed with multiple family members at bedside  Disposition Plan: home when stable   Procedures  none   Consults   none   Medications  Scheduled Meds: . ALPRAZolam  0.25 mg Oral QHS  . antiseptic oral rinse  7 mL Mouth Rinse q12n4p  . apixaban  5 mg Oral BID  . chlorhexidine  15 mL Mouth Rinse BID  . dronabinol  2.5 mg Oral QAC lunch  . feeding supplement (ENSURE ENLIVE)  237 mL Oral BID BM  . fluticasone  2 spray Each Nare Daily  . insulin aspart  0-5 Units Subcutaneous QHS  . insulin aspart  0-9 Units Subcutaneous TID WC  . lipase/protease/amylase  36,000 Units Oral TID WC  . megestrol  400 mg Oral BID  . morphine  30 mg Oral q morning - 10a  . nicotine  21 mg Transdermal Daily  . pantoprazole  40 mg Oral Daily  . tamsulosin  0.4 mg Oral Daily   Continuous Infusions: . sodium chloride 50 mL/hr at 01/25/16 1218   PRN Meds:.acetaminophen **OR** acetaminophen, albuterol, diphenoxylate-atropine, HYDROcodone-acetaminophen, morphine injection, ondansetron **OR** ondansetron (  ZOFRAN) IV, oxyCODONE, zolpidem  DVT Prophylaxis  On Eliquis  Lab Results  Component Value Date   PLT 120* 01/25/2016    Antibiotics    Anti-infectives    Start     Dose/Rate Route Frequency Ordered Stop   01/24/16 1000  levofloxacin (LEVAQUIN) tablet 750 mg  Status:  Discontinued     750 mg Oral Daily 01/23/16 2311 01/24/16 1402          Objective:   Filed Vitals:   01/24/16 2215 01/25/16 0221 01/25/16 0510 01/25/16 0955  BP: 107/58 104/57 110/62 118/67  Pulse: 83 75 73 73  Temp: 98.1 F (36.7 C) 97.5 F (36.4 C) 97.9 F (36.6 C) 98.1 F (36.7 C)  TempSrc: Oral Oral Oral Oral  Resp: 20 20 20 18    Height:      Weight:      SpO2: 98% 95%  96%    Wt Readings from Last 3 Encounters:  01/24/16 71.8 kg (158 lb 4.6 oz)  01/12/16 66.679 kg (147 lb)  01/09/16 66.815 kg (147 lb 4.8 oz)     Intake/Output Summary (Last 24 hours) at 01/25/16 1454 Last data filed at 01/25/16 0900  Gross per 24 hour  Intake   1182 ml  Output    600 ml  Net    582 ml     Physical Exam  Awake Alert, Oriented X 3, frail, chronically ill-appearing [place].AT,PERRAL Supple Neck,No JVD Symmetrical Chest wall movement, Good air movement bilaterally, No Gallops,Rubs or new Murmurs, No Parasternal Heave +ve B.Sounds, Abd Soft, No tenderness, mild ascites No Cyanosi+1 edema bilaterally    Data Review   Micro Results Recent Results (from the past 240 hour(s))  Anaerobic culture     Status: None   Collection Time: 01/20/16  5:25 PM  Result Value Ref Range Status   Specimen Description ASCITIC  Final   Special Requests NONE  Final   Gram Stain   Final    NO WBC SEEN NO ORGANISMS SEEN Performed at [facility]    Culture   Final    NO ANAEROBES ISOLATED Performed at [facility]    Report Status 01/25/2016 FINAL  Final  Culture, body fluid-bottle     Status: None (Preliminary result)   Collection Time: 01/20/16  5:26 PM  Result Value Ref Range Status   Specimen Description FLUID ASCITIC  Final   Special Requests BOTTLES DRAWN AEROBIC AND ANAEROBIC 10CC  Final   Culture   Final    NO GROWTH 4 DAYS Performed at [facility]    Report Status PENDING  Incomplete  Gram stain     Status: None   Collection Time: 01/20/16  5:26 PM  Result Value Ref Range Status   Specimen Description FLUID ASCITIC  Final   Special Requests NONE  Final   Gram Stain   Final    CYTOSPIN SMEAR WBC PRESENT,BOTH PMN AND MONONUCLEAR NO ORGANISMS SEEN Performed at [facility]    Report Status 01/21/2016 FINAL  Final  Urine culture     Status: None   Collection Time: 01/24/16 12:47 AM   Result Value Ref Range Status   Specimen Description URINE, CATHETERIZED  Final   Special Requests NONE  Final   Culture   Final    NO GROWTH 1 DAY Performed at [facility]    Report Status 01/25/2016 FINAL  Final  C difficile quick scan w PCR reflex     Status: None  Collection Time: 01/24/16 10:10 AM  Result Value Ref Range Status   C Diff antigen NEGATIVE NEGATIVE Final   C Diff toxin NEGATIVE NEGATIVE Final   C Diff interpretation Negative for toxigenic C. difficile  Final    Radiology Reports Ct Abdomen Pelvis Wo Contrast  01/13/2016  CLINICAL DATA:  Acute onset of nausea, vomiting, chills and fever. Diarrhea, status post radiation therapy for pancreatic cancer. Initial encounter. EXAM: CT ABDOMEN AND PELVIS WITHOUT CONTRAST TECHNIQUE: Multidetector CT imaging of the abdomen and pelvis was performed following the standard protocol without IV contrast. COMPARISON:  CT of the abdomen and pelvis from 11/14/2015 FINDINGS: Trace bilateral pleural effusions are noted, with bibasilar airspace opacity, which may reflect atelectasis or pneumonia. Pacemaker leads are partially imaged. Since the prior study, there is increased moderate volume ascites within the abdomen and pelvis. A biliary duct stent is noted in unchanged position, and is partially filled with air, with a large amount of pneumobilia noted within the liver. The spleen is mildly bulky but remains normal in size. Trace air is noted within the gallbladder. The gallbladder is difficult to fully assess given ascites. The known pancreatic head mass is difficult to fully assess without contrast. The adrenal glands are grossly unremarkable. A 3.5 cm hyperdense mass along the lateral aspect of the right kidney is mildly increased in size and may reflect a complex renal cyst or malignancy. Nonspecific perinephric stranding is noted bilaterally. There is no evidence of hydronephrosis. No renal or ureteral stones are seen. Scattered  vascular calcifications are seen at both renal hila. The small bowel is grossly unremarkable in appearance. The stomach is within normal limits. No acute vascular abnormalities are seen. 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 Diffuse calcification is noted along the abdominal aorta and its branches. The appendix remains normal in caliber and contains contrast, without evidence of appendicitis. Apparent diffuse wall thickening and mucosal edema is noted along the ascending colon, which may reflect mild infectious or inflammatory colitis. Scattered diverticulosis is noted along the descending and sigmoid colon, without evidence of diverticulitis. The bladder is mildly distended and grossly unremarkable. The prostate is enlarged, measuring 5.4 cm in transverse dimension. No inguinal lymphadenopathy is seen. No acute osseous abnormalities are identified. Thoracolumbar spinal fusion hardware is noted. There is chronic compression deformity involving vertebral body T12, with underlying chronic osseous fusion. IMPRESSION: 1. Increasing moderate volume ascites within the abdomen and pelvis. 2. Apparent diffuse wall thickening and mucosal edema along the ascending colon, which may reflect mild infectious or inflammatory colitis, depending on the patient's symptoms. Alternatively, this may be related to the patient's ascites. 3. Trace bilateral pleural effusions, with bibasilar airspace opacity, which may reflect atelectasis or pneumonia. 4. Biliary duct stent is noted in unchanged position, with a large amount of pneumobilia noted within the liver. 5. Known pancreatic head mass difficult to fully assess without contrast. 6. 3.5 cm hyperdense mass along the lateral aspect of the right kidney has mildly increased in size and may reflect a complex renal cyst for malignancy, as previously described. 7. Diffuse calcification along the abdominal aorta and its branches. 8.  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 Diffuse calcification along the abdominal aorta and its branches. Recommend followup by ultrasound in 3 years. This recommendation follows ACR consensus guidelines: White Paper of the ACR Incidental Findings Committee II on Vascular Findings. J Am Coll Radiol 2013; 10:789-794.  9. Scattered diverticulosis along the descending and sigmoid colon, without evidence of diverticulitis. 10. Enlarged prostate noted. Electronically Signed   By: Garald Balding M.D.   On: 01/13/2016 03:18   Dg Chest 2 View  01/23/2016  CLINICAL DATA:  Extremity edema. EXAM: CHEST  2 VIEW COMPARISON:  01/12/2016. FINDINGS: AP and lateral views of the chest show bibasilar atelectasis with symmetric small bilateral pleural effusions. Basilar pneumonia not completely excluded. The cardiopericardial silhouette is within normal limits for size. Bones are diffusely demineralized. Left permanent pacemaker again noted. Telemetry leads overlie the chest. Extensive thoracolumbar spinal fusion hardware again noted. IMPRESSION: New bibasilar collapse/ consolidation with small bilateral pleural effusions. Electronically Signed   By: Misty Stanley M.D.   On: 01/23/2016 17:20   Dg Chest 2 View  01/12/2016  CLINICAL DATA:  75 year old male with sepsis EXAM: CHEST  2 VIEW COMPARISON:  Chest CT dated 11/25/2015 FINDINGS: Two views of the chest do not demonstrate a focal consolidation. There is no pleural effusion or pneumothorax. Bibasilar atelectatic changes most prominent on the right. The cardiac silhouette is within normal limits. Left pectoral pacemaker device. There is osteopenia with degenerative changes of the spine. Thoracolumbar fixation hardware noted. No acute osseous pathology. IMPRESSION: No active cardiopulmonary disease. Electronically Signed   By: Anner Crete M.D.   On: 01/12/2016 18:58   US Abdomen Limited  01/20/2016  CLINICAL DATA:  Ascites.  EXAM: LIMITED ABDOMEN ULTRASOUND FOR ASCITES TECHNIQUE: Limited ultrasound survey for ascites was performed in all four abdominal quadrants. COMPARISON:  CT 01/13/2016 FINDINGS: Exam demonstrates moderate simple appearing ascites in all 4 quadrants. IMPRESSION: Moderate ascites. Electronically Signed   By: Marin Olp M.D.   On: 01/20/2016 13:35   US Paracentesis  01/20/2016  INDICATION: Pancreatic cancer, recurrent ascites. Request is made for diagnostic and therapeutic paracentesis. EXAM: ULTRASOUND GUIDED DIAGNOSTIC AND THERAPEUTIC PARACENTESIS MEDICATIONS: None. COMPLICATIONS: None immediate. PROCEDURE: Informed written consent was obtained from the patient after a discussion of the risks, benefits and alternatives to treatment. A timeout was performed prior to the initiation of the procedure. Initial ultrasound scanning demonstrates a small to moderate amount of ascites within the right mid to upper abdominal quadrant. The right mid to upper abdomen was prepped and draped in the usual sterile fashion. 1% lidocaine was used for local anesthesia. Following this, a Yueh catheter was introduced. An ultrasound image was saved for documentation purposes. The paracentesis was performed. The catheter was removed and a dressing was applied. The patient tolerated the procedure well without immediate post procedural complication. FINDINGS: A total of approximately 2 liters of clear, light yellow fluid was removed. Samples were sent to the laboratory as requested by the clinical team. IMPRESSION: Successful ultrasound-guided diagnostic and therapeutic paracentesis yielding 2 liters of peritoneal fluid. Read by: Rowe Robert, PA-C Electronically Signed   By: Markus Daft M.D.   On: 01/20/2016 17:07   US Paracentesis  01/15/2016  INDICATION: History of pancreatic cancer with new onset ascites. Request has been made for diagnostic and therapeutic paracentesis EXAM: ULTRASOUND GUIDED right upper quadrant PARACENTESIS  MEDICATIONS: 1% lidocaine COMPLICATIONS: None immediate. PROCEDURE: Informed written consent was obtained from the patient after a discussion of the risks, benefits and alternatives to treatment. A timeout was performed prior to the initiation of the procedure. Initial ultrasound scanning demonstrates a moderate amount of ascites within the right upper abdominal quadrant. The right upper abdomen was prepped and draped in the usual sterile fashion. 1% lidocaine was used for local anesthesia.  Following this, a Safe-T-Centesis catheter was introduced. An ultrasound image was saved for documentation purposes. The paracentesis was performed. The catheter was removed and a dressing was applied. The patient tolerated the procedure well without immediate post procedural complication. FINDINGS: A total of approximately 3.7 L of clear yellow fluid was removed. Samples were sent to the laboratory as requested by the clinical team. IMPRESSION: Successful ultrasound-guided paracentesis yielding 3.7 liters of peritoneal fluid. Read by: Saverio Danker, PA-C Electronically Signed   By: Lucrezia Europe M.D.   On: 01/15/2016 14:47     CBC  Recent Labs Lab 01/20/16 0559 01/21/16 0529 01/23/16 1613 01/24/16 0741 01/25/16 0649  WBC 3.2* 2.1* 6.3 2.5* 3.5*  HGB 8.5* 8.4* 11.0* 8.3* 8.7*  HCT 24.4* 24.4* 32.4* 22.6* 24.9*  PLT 119* 121* 195 124* 120*  MCV 89.1 90.0 90.0 88.3 88.9  MCH 31.0 31.0 30.6 32.0 31.1  MCHC 34.8 34.4 34.0 36.2* 34.9  RDW 17.9* 17.8* 17.5* 17.6* 17.2*  LYMPHSABS 0.3*  --  0.9  --   --   MONOABS 0.4  --  0.7  --   --   EOSABS 0.3  --  0.6  --   --   BASOSABS 0.0  --  0.0  --   --     Chemistries   Recent Labs Lab 01/19/16 0630 01/20/16 0559 01/21/16 0529 01/23/16 1633 01/24/16 0741 01/25/16 0649  NA 135 134* 134* 132* 130* 130*  K 3.1* 3.8 3.7 4.1 3.8 3.8  CL 106 105 104 102 102 101  CO2 22 23 24 22 24 23   GLUCOSE 92 84 96 82 89 87  BUN 9 10 11 17 19 20   CREATININE 1.18 1.09 1.14  1.78* 1.83* 2.11*  CALCIUM 7.5* 7.7* 7.9* 8.5* 7.9* 7.9*  MG 1.8  --  1.7  --  1.6*  --   AST  --   --  27 35 21  --   ALT  --   --  15* 17 11*  --   ALKPHOS  --   --  77 99 74  --   BILITOT  --   --  1.0 1.3* 0.8  --    ------------------------------------------------------------------------------------------------------------------ estimated creatinine clearance is 31.2 mL/min (by C-G formula based on Cr of 2.11). ------------------------------------------------------------------------------------------------------------------ No results for input(s): HGBA1C in the last 72 hours. ------------------------------------------------------------------------------------------------------------------ No results for input(s): CHOL, HDL, LDLCALC, TRIG, CHOLHDL, LDLDIRECT in the last 72 hours. ------------------------------------------------------------------------------------------------------------------ No results for input(s): TSH, T4TOTAL, T3FREE, THYROIDAB in the last 72 hours.  Invalid input(s): FREET3 ------------------------------------------------------------------------------------------------------------------ No results for input(s): VITAMINB12, FOLATE, FERRITIN, TIBC, IRON, RETICCTPCT in the last 72 hours.  Coagulation profile  Recent Labs Lab 01/23/16 1613  INR 2.19*    No results for input(s): DDIMER in the last 72 hours.  Cardiac Enzymes No results for input(s): CKMB, TROPONINI, MYOGLOBIN in the last 168 hours.  Invalid input(s): CK ------------------------------------------------------------------------------------------------------------------ Invalid input(s): POCBNP     Time Spent in minutes   30 minutes   Leni Pankonin M.D on 01/25/2016 at 2:54 PM  Between 7am to 7pm - Pager - [phone]  After 7pm go to [email or web address] - password [facility]  [facility]   Office  [phone]

## 2016-01-25 NOTE — Progress Notes (Signed)
Initial Nutrition Assessment  DOCUMENTATION CODES:   Severe malnutrition in context of chronic illness  INTERVENTION:   Continue Ensure Enlive po BID, each supplement provides 350 kcal and 20 grams of protein RD to continue to monitor  NUTRITION DIAGNOSIS:   Malnutrition related to chronic illness as evidenced by severe fluid accumulation, percent weight loss, energy intake < or equal to 75% for > or equal to 1 month.  GOAL:   Patient will meet greater than or equal to 90% of their needs  MONITOR:   PO intake, Supplement acceptance, Labs, Weight trends, I & O's (GOC)  REASON FOR ASSESSMENT:   Consult Assessment of nutrition requirement/status  ASSESSMENT:   75 y.o male Presented with increased diffuse swelling including of lower and upper extremity as well as all of his abdomen. He's been having urinary retention has been reporting increasing nausea.   Patient followed by nutrition during previous admission (2/7-2/15). Pt's PO intake has improved since that admission, 60% of meals today. Ate 100% of a potato yesterday. Overall PO intake <75% of usual. Pt with anasarca which is contributing to weight gain in weight history. Pt with 27% weight loss since June 2016, significant for time frame. Palliative following for GOC discussion.  Medications:albumin transfusion, Marinol capsule daily, Creon capsule TID, Megace BID, IV Zofran PRN  Labs reviewed: CBGs: 84-126 Low Na  Diet Order:  Diet Heart Room service appropriate?: Yes; Fluid consistency:: Thin  Skin:  Reviewed, no issues  Last BM:  2/18  Height:   Ht Readings from Last 1 Encounters:  01/23/16 5\' 11"  (1.803 m)    Weight:   Wt Readings from Last 1 Encounters:  01/24/16 158 lb 4.6 oz (71.8 kg)    Ideal Body Weight:  78.18 kg  BMI:  Body mass index is 22.09 kg/(m^2).  Estimated Nutritional Needs:   Kcal:  2000-2200  Protein:  95-105g  Fluid:  1.8L/day  EDUCATION NEEDS:   No education needs  identified at this time  Clayton Bibles, MS, RD, LDN Pager: [phone] After Hours Pager: [phone]

## 2016-01-25 NOTE — Progress Notes (Signed)
Foley catheter discontinued per MD order. Patient tolerated procedure well. Will continue to monitor. Pt due to void by 2215.

## 2016-01-26 ENCOUNTER — Telehealth: Payer: Self-pay | Admitting: Oncology

## 2016-01-26 ENCOUNTER — Inpatient Hospital Stay (HOSPITAL_COMMUNITY): Payer: BLUE CROSS/BLUE SHIELD

## 2016-01-26 DIAGNOSIS — R1900 Intra-abdominal and pelvic swelling, mass and lump, unspecified site: Secondary | ICD-10-CM

## 2016-01-26 DIAGNOSIS — Z515 Encounter for palliative care: Secondary | ICD-10-CM | POA: Insufficient documentation

## 2016-01-26 DIAGNOSIS — M7989 Other specified soft tissue disorders: Secondary | ICD-10-CM

## 2016-01-26 DIAGNOSIS — N179 Acute kidney failure, unspecified: Principal | ICD-10-CM

## 2016-01-26 DIAGNOSIS — Z7189 Other specified counseling: Secondary | ICD-10-CM | POA: Insufficient documentation

## 2016-01-26 DIAGNOSIS — R601 Generalized edema: Secondary | ICD-10-CM

## 2016-01-26 DIAGNOSIS — R188 Other ascites: Secondary | ICD-10-CM

## 2016-01-26 LAB — CBC
HCT: 24.3 % — ABNORMAL LOW (ref 39.0–52.0)
Hemoglobin: 8.4 g/dL — ABNORMAL LOW (ref 13.0–17.0)
MCH: 31 pg (ref 26.0–34.0)
MCHC: 34.6 g/dL (ref 30.0–36.0)
MCV: 89.7 fL (ref 78.0–100.0)
PLATELETS: 107 10*3/uL — AB (ref 150–400)
RBC: 2.71 MIL/uL — ABNORMAL LOW (ref 4.22–5.81)
RDW: 16.8 % — AB (ref 11.5–15.5)
WBC: 1.9 10*3/uL — AB (ref 4.0–10.5)

## 2016-01-26 LAB — TYPE AND SCREEN
ABO/RH(D): A POS
ANTIBODY SCREEN: NEGATIVE
UNIT DIVISION: 0

## 2016-01-26 LAB — GRAM STAIN

## 2016-01-26 LAB — COMPREHENSIVE METABOLIC PANEL
ALBUMIN: 2.9 g/dL — AB (ref 3.5–5.0)
ALT: 10 U/L — ABNORMAL LOW (ref 17–63)
ANION GAP: 7 (ref 5–15)
AST: 20 U/L (ref 15–41)
Alkaline Phosphatase: 63 U/L (ref 38–126)
BUN: 20 mg/dL (ref 6–20)
CALCIUM: 8 mg/dL — AB (ref 8.9–10.3)
CHLORIDE: 102 mmol/L (ref 101–111)
CO2: 22 mmol/L (ref 22–32)
CREATININE: 2.09 mg/dL — AB (ref 0.61–1.24)
GFR calc Af Amer: 34 mL/min — ABNORMAL LOW (ref >60)
GFR, EST NON AFRICAN AMERICAN: 30 mL/min — AB (ref >60)
GLUCOSE: 105 mg/dL — AB (ref 65–99)
POTASSIUM: 3.6 mmol/L (ref 3.5–5.1)
Sodium: 131 mmol/L — ABNORMAL LOW (ref 135–145)
TOTAL PROTEIN: 4.9 g/dL — AB (ref 6.5–8.1)
Total Bilirubin: 1 mg/dL (ref 0.3–1.2)

## 2016-01-26 LAB — BODY FLUID CELL COUNT WITH DIFFERENTIAL
LYMPHS FL: 43 %
MONOCYTE-MACROPHAGE-SEROUS FLUID: 48 % — AB (ref 50–90)
NEUTROPHIL FLUID: 9 % (ref 0–25)
WBC FLUID: 62 uL (ref 0–1000)

## 2016-01-26 LAB — GLUCOSE, CAPILLARY
GLUCOSE-CAPILLARY: 97 mg/dL (ref 65–99)
GLUCOSE-CAPILLARY: 91 mg/dL (ref 65–99)
GLUCOSE-CAPILLARY: 103 mg/dL — AB (ref 65–99)
Glucose-Capillary: 97 mg/dL (ref 65–99)
Glucose-Capillary: 104 mg/dL — ABNORMAL HIGH (ref 65–99)

## 2016-01-26 LAB — LACTATE DEHYDROGENASE, PLEURAL OR PERITONEAL FLUID: LD, Fluid: 37 U/L — ABNORMAL HIGH (ref 3–23)

## 2016-01-26 LAB — PROTEIN, BODY FLUID

## 2016-01-26 NOTE — Progress Notes (Addendum)
Patient Demographics  Antonio Hancock, is a 75 y.o. male, DOB - 1941/10/09, KH:[id number]  Admit date - 01/23/2016   Admitting Physician Toy Baker, MD  Outpatient Primary MD for the patient is Woody Seller, MD  LOS -    Chief Complaint  Patient presents with  . Arm Swelling       Admission history of present illness/brief narrative: 75 year old male with history of hypertension, diabetes, Mobitz type II, tobacco abuse, hyperlipidemia, recent diagnosis of locally metastatic pancreatic cancer, followed by Dr.Shadad, receiving radiation therapy and oral Xeloda, with recurrent hospitalization secondary to deconditioning, anasarca, and recurrent ascites, recurrent paracentesis with no evidence of malignant cells, presents with worsening anasarca and fatigue, workup significant for worsening renal function, anemia, and hyperalbuminemia with severe protein calorie malnutrition.  Subjective:   Antonio Hancock today has, No headache, No chest pain, ports fatigue, poor appetite, mild abdominal pain, had good night sleep . Assessment & Plan    Active Problems:   Diabetes mellitus (HCC)   Atrial fibrillation (HCC)   Obstructive sleep apnea   Hyponatremia   Pancreatic mass   Malignant neoplasm of head of pancreas (HCC)   Hypoalbuminemia due to protein-calorie malnutrition (HCC)   Chronic diastolic CHF (congestive heart failure), NYHA class 1 (HCC)   CKD (chronic kidney disease) stage 3, GFR 30-59 ml/min   Protein-calorie malnutrition, severe   Acute renal failure (ARF) (HCC)   Anasarca  Anasarca with recurrent ascites - Appears to be secondary to hypoalbuminemia from severe protein calorie malnutrition. - Diuresis is on hold given worsening renal function. - Recurrent paracentesis in the past with no evidence of malignant cells, only showing atypical cells - Status post bilateral  thoracentesis 2/20 with 4.1 L drained, cytology still pending.  Acute renal failure - This is prerenal in the setting of low urinary sodium and FeNa <1, and this is most likely due to third spacing from malnutrition and hypoalbuminemia. -  discussed with Dr. Florene Glen from nephrology, try to give colloid, and you with IV albumin, transfused 1 unit PRBC on 2/19 . - Patient started to develop signs of volume overload, worsening lower extremity edema, genital edema, will discontinue IV fluids .  Hyponatremia - due to volume depletion as FeNa < 1%, and urine sodium < 10.  severeProtein calorie malnutrition/hypoalbuminemia - We'll start on ensure - we'll give albumin transfusion - Continue with Megace and Marinol  Pancreatic cancer -  by oncology Dr. Manuella Ghazi., follow-up greatly appreciated, locally advanced pancreatic cancer, currently on radiation therapy, Xeloda is on hold. - Treatment goal has been to palliate his cancer, cure is unlikely.  Chronic diastolic CHF - Even with anasarca, this appears to be more likely due to malnutrition and hypoalbuminemia, no respiratory distress, no evidence of pulmonary overload.   Anemia of chronic disease - Patient received 1 unit PRBC on 2/19   diabetes mellitus  - Continue with SSI   Diarrhea - Chronic, C. difficile negative  Urinary retention -Hearted on Flomax, DC Foley catheter 2/19, no further evidence of urinary retention   Code Status: DNR  Family Communication: Discussed with multiple family members at bedside  Disposition Plan:PT consult pending  Procedures Ultrasound-guided paracentesis 2/2 with 4.1 L drained 1 unit PRBC transfusion 2/19 Consults  Oncology Palliative medicine   Medications  Scheduled Meds: . albumin human  25 g Intravenous Q6H  . ALPRAZolam  0.25 mg Oral QHS  . antiseptic oral rinse  7 mL Mouth Rinse q12n4p  . apixaban  5 mg Oral BID  . chlorhexidine  15 mL Mouth Rinse BID  . dronabinol  2.5 mg Oral QAC  lunch  . feeding supplement (ENSURE ENLIVE)  237 mL Oral BID BM  . fluticasone  2 spray Each Nare Daily  . insulin aspart  0-5 Units Subcutaneous QHS  . insulin aspart  0-9 Units Subcutaneous TID WC  . lipase/protease/amylase  36,000 Units Oral TID WC  . megestrol  400 mg Oral BID  . morphine  30 mg Oral q morning - 10a  . nicotine  21 mg Transdermal Daily  . pantoprazole  40 mg Oral Daily  . tamsulosin  0.4 mg Oral Daily   Continuous Infusions: . sodium chloride 50 mL/hr at 01/25/16 1218   PRN Meds:.acetaminophen **OR** acetaminophen, albuterol, diphenoxylate-atropine, HYDROcodone-acetaminophen, morphine injection, ondansetron **OR** ondansetron (ZOFRAN) IV, oxyCODONE, zolpidem  DVT Prophylaxis  On Eliquis  Lab Results  Component Value Date   PLT 107* 01/26/2016    Antibiotics    Anti-infectives    Start     Dose/Rate Route Frequency Ordered Stop   01/24/16 1000  levofloxacin (LEVAQUIN) tablet 750 mg  Status:  Discontinued     750 mg Oral Daily 01/23/16 2311 01/24/16 1402          Objective:   Filed Vitals:   01/26/16 1507 01/26/16 1511 01/26/16 1516 01/26/16 1553  BP: 96/65 102/51 99/53 111/66  Pulse:    69  Temp:    97.7 F (36.5 C)  TempSrc:    Oral  Resp:    18  Height:      Weight:      SpO2:    100%    Wt Readings from Last 3 Encounters:  01/24/16 71.8 kg (158 lb 4.6 oz)  01/12/16 66.679 kg (147 lb)  01/09/16 66.815 kg (147 lb 4.8 oz)     Intake/Output Summary (Last 24 hours) at 01/26/16 1607 Last data filed at 01/26/16 1300  Gross per 24 hour  Intake 6229.6 ml  Output    200 ml  Net 6029.6 ml     Physical Exam  Awake Alert, Oriented X 3, frail, chronically ill-appearing .AT,PERRAL Supple Neck,No JVD Symmetrical Chest wall movement, Good air movement bilaterally, No Gallops,Rubs or new Murmurs, No Parasternal Heave +ve B.Sounds, Abd Soft, No tenderness, with significant ascites today (patient examined before paracentesis ) No  Cyanosi+1 edema bilaterally    Data Review   Micro Results Recent Results (from the past 240 hour(s))  Anaerobic culture     Status: None   Collection Time: 01/20/16  5:25 PM  Result Value Ref Range Status   Specimen Description ASCITIC  Final   Special Requests NONE  Final   Gram Stain   Final    NO WBC SEEN NO ORGANISMS SEEN Performed at [facility]    Culture   Final    NO ANAEROBES ISOLATED Performed at [facility]    Report Status 01/25/2016 FINAL  Final  Culture, body fluid-bottle     Status: None   Collection Time: 01/20/16  5:26 PM  Result Value Ref Range Status   Specimen Description FLUID ASCITIC  Final   Special Requests BOTTLES DRAWN AEROBIC AND ANAEROBIC 10CC  Final   Culture   Final  NO GROWTH 5 DAYS Performed at [facility]    Report Status 01/25/2016 FINAL  Final  Gram stain     Status: None   Collection Time: 01/20/16  5:26 PM  Result Value Ref Range Status   Specimen Description FLUID ASCITIC  Final   Special Requests NONE  Final   Gram Stain   Final    CYTOSPIN SMEAR WBC PRESENT,BOTH PMN AND MONONUCLEAR NO ORGANISMS SEEN Performed at [facility]    Report Status 01/21/2016 FINAL  Final  Urine culture     Status: None   Collection Time: 01/24/16 12:47 AM  Result Value Ref Range Status   Specimen Description URINE, CATHETERIZED  Final   Special Requests NONE  Final   Culture   Final    NO GROWTH 1 DAY Performed at [facility]    Report Status 01/25/2016 FINAL  Final  C difficile quick scan w PCR reflex     Status: None   Collection Time: 01/24/16 10:10 AM  Result Value Ref Range Status   C Diff antigen NEGATIVE NEGATIVE Final   C Diff toxin NEGATIVE NEGATIVE Final   C Diff interpretation Negative for toxigenic C. difficile  Final    Radiology Reports Ct Abdomen Pelvis Wo Contrast  01/13/2016  CLINICAL DATA:  Acute onset of nausea, vomiting, chills and fever. Diarrhea, status post radiation  therapy for pancreatic cancer. Initial encounter. EXAM: CT ABDOMEN AND PELVIS WITHOUT CONTRAST TECHNIQUE: Multidetector CT imaging of the abdomen and pelvis was performed following the standard protocol without IV contrast. COMPARISON:  CT of the abdomen and pelvis from 11/14/2015 FINDINGS: Trace bilateral pleural effusions are noted, with bibasilar airspace opacity, which may reflect atelectasis or pneumonia. Pacemaker leads are partially imaged. Since the prior study, there is increased moderate volume ascites within the abdomen and pelvis. A biliary duct stent is noted in unchanged position, and is partially filled with air, with a large amount of pneumobilia noted within the liver. The spleen is mildly bulky but remains normal in size. Trace air is noted within the gallbladder. The gallbladder is difficult to fully assess given ascites. The known pancreatic head mass is difficult to fully assess without contrast. The adrenal glands are grossly unremarkable. A 3.5 cm hyperdense mass along the lateral aspect of the right kidney is mildly increased in size and may reflect a complex renal cyst or malignancy. Nonspecific perinephric stranding is noted bilaterally. There is no evidence of hydronephrosis. No renal or ureteral stones are seen. Scattered vascular calcifications are seen at both renal hila. The small bowel is grossly unremarkable in appearance. The stomach is within normal limits. No acute vascular abnormalities are seen. 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 Diffuse calcification is noted along the abdominal aorta and its branches. The appendix remains normal in caliber and contains contrast, without evidence of appendicitis. Apparent diffuse wall thickening and mucosal edema is noted along the ascending colon, which may reflect mild infectious or inflammatory colitis. Scattered diverticulosis is noted along the descending and sigmoid colon,  without evidence of diverticulitis. The bladder is mildly distended and grossly unremarkable. The prostate is enlarged, measuring 5.4 cm in transverse dimension. No inguinal lymphadenopathy is seen. No acute osseous abnormalities are identified. Thoracolumbar spinal fusion hardware is noted. There is chronic compression deformity involving vertebral body T12, with underlying chronic osseous fusion. IMPRESSION: 1. Increasing moderate volume ascites within the abdomen and pelvis. 2. Apparent diffuse wall  thickening and mucosal edema along the ascending colon, which may reflect mild infectious or inflammatory colitis, depending on the patient's symptoms. Alternatively, this may be related to the patient's ascites. 3. Trace bilateral pleural effusions, with bibasilar airspace opacity, which may reflect atelectasis or pneumonia. 4. Biliary duct stent is noted in unchanged position, with a large amount of pneumobilia noted within the liver. 5. Known pancreatic head mass difficult to fully assess without contrast. 6. 3.5 cm hyperdense mass along the lateral aspect of the right kidney has mildly increased in size and may reflect a complex renal cyst for malignancy, as previously described. 7. Diffuse calcification along the abdominal aorta and its branches. 8. 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 Diffuse calcification along the abdominal aorta and its branches. Recommend followup by ultrasound in 3 years. This recommendation follows ACR consensus guidelines: White Paper of the ACR Incidental Findings Committee II on Vascular Findings. J Am Coll Radiol 2013; 10:789-794. 9. Scattered diverticulosis along the descending and sigmoid colon, without evidence of diverticulitis. 10. Enlarged prostate noted. Electronically Signed   By: Garald Balding M.D.   On: 01/13/2016 03:18   Dg Chest 2 View  01/23/2016  CLINICAL DATA:  Extremity edema. EXAM: CHEST  2 VIEW COMPARISON:   01/12/2016. FINDINGS: AP and lateral views of the chest show bibasilar atelectasis with symmetric small bilateral pleural effusions. Basilar pneumonia not completely excluded. The cardiopericardial silhouette is within normal limits for size. Bones are diffusely demineralized. Left permanent pacemaker again noted. Telemetry leads overlie the chest. Extensive thoracolumbar spinal fusion hardware again noted. IMPRESSION: New bibasilar collapse/ consolidation with small bilateral pleural effusions. Electronically Signed   By: Misty Stanley M.D.   On: 01/23/2016 17:20   Dg Chest 2 View  01/12/2016  CLINICAL DATA:  75 year old male with sepsis EXAM: CHEST  2 VIEW COMPARISON:  Chest CT dated 11/25/2015 FINDINGS: Two views of the chest do not demonstrate a focal consolidation. There is no pleural effusion or pneumothorax. Bibasilar atelectatic changes most prominent on the right. The cardiac silhouette is within normal limits. Left pectoral pacemaker device. There is osteopenia with degenerative changes of the spine. Thoracolumbar fixation hardware noted. No acute osseous pathology. IMPRESSION: No active cardiopulmonary disease. Electronically Signed   By: Anner Crete M.D.   On: 01/12/2016 18:58   US Abdomen Limited  01/20/2016  CLINICAL DATA:  Ascites. EXAM: LIMITED ABDOMEN ULTRASOUND FOR ASCITES TECHNIQUE: Limited ultrasound survey for ascites was performed in all four abdominal quadrants. COMPARISON:  CT 01/13/2016 FINDINGS: Exam demonstrates moderate simple appearing ascites in all 4 quadrants. IMPRESSION: Moderate ascites. Electronically Signed   By: Marin Olp M.D.   On: 01/20/2016 13:35   US Paracentesis  01/20/2016  INDICATION: Pancreatic cancer, recurrent ascites. Request is made for diagnostic and therapeutic paracentesis. EXAM: ULTRASOUND GUIDED DIAGNOSTIC AND THERAPEUTIC PARACENTESIS MEDICATIONS: None. COMPLICATIONS: None immediate. PROCEDURE: Informed written consent was obtained from the  patient after a discussion of the risks, benefits and alternatives to treatment. A timeout was performed prior to the initiation of the procedure. Initial ultrasound scanning demonstrates a small to moderate amount of ascites within the right mid to upper abdominal quadrant. The right mid to upper abdomen was prepped and draped in the usual sterile fashion. 1% lidocaine was used for local anesthesia. Following this, a Yueh catheter was introduced. An ultrasound image was saved for documentation purposes. The paracentesis was performed. The catheter was removed and a dressing was applied. The patient tolerated  the procedure well without immediate post procedural complication. FINDINGS: A total of approximately 2 liters of clear, light yellow fluid was removed. Samples were sent to the laboratory as requested by the clinical team. IMPRESSION: Successful ultrasound-guided diagnostic and therapeutic paracentesis yielding 2 liters of peritoneal fluid. Read by: Rowe Robert, PA-C Electronically Signed   By: Markus Daft M.D.   On: 01/20/2016 17:07   US Paracentesis  01/15/2016  INDICATION: History of pancreatic cancer with new onset ascites. Request has been made for diagnostic and therapeutic paracentesis EXAM: ULTRASOUND GUIDED right upper quadrant PARACENTESIS MEDICATIONS: 1% lidocaine COMPLICATIONS: None immediate. PROCEDURE: Informed written consent was obtained from the patient after a discussion of the risks, benefits and alternatives to treatment. A timeout was performed prior to the initiation of the procedure. Initial ultrasound scanning demonstrates a moderate amount of ascites within the right upper abdominal quadrant. The right upper abdomen was prepped and draped in the usual sterile fashion. 1% lidocaine was used for local anesthesia. Following this, a Safe-T-Centesis catheter was introduced. An ultrasound image was saved for documentation purposes. The paracentesis was performed. The catheter was removed  and a dressing was applied. The patient tolerated the procedure well without immediate post procedural complication. FINDINGS: A total of approximately 3.7 L of clear yellow fluid was removed. Samples were sent to the laboratory as requested by the clinical team. IMPRESSION: Successful ultrasound-guided paracentesis yielding 3.7 liters of peritoneal fluid. Read by: Saverio Danker, PA-C Electronically Signed   By: Lucrezia Europe M.D.   On: 01/15/2016 14:47     CBC  Recent Labs Lab 01/20/16 0559 01/21/16 0529 01/23/16 1613 01/24/16 0741 01/25/16 0649 01/26/16 0358  WBC 3.2* 2.1* 6.3 2.5* 3.5* 1.9*  HGB 8.5* 8.4* 11.0* 8.3* 8.7* 8.4*  HCT 24.4* 24.4* 32.4* 22.6* 24.9* 24.3*  PLT 119* 121* 195 124* 120* 107*  MCV 89.1 90.0 90.0 88.3 88.9 89.7  MCH 31.0 31.0 30.6 32.0 31.1 31.0  MCHC 34.8 34.4 34.0 36.2* 34.9 34.6  RDW 17.9* 17.8* 17.5* 17.6* 17.2* 16.8*  LYMPHSABS 0.3*  --  0.9  --   --   --   MONOABS 0.4  --  0.7  --   --   --   EOSABS 0.3  --  0.6  --   --   --   BASOSABS 0.0  --  0.0  --   --   --     Chemistries   Recent Labs Lab 01/21/16 0529 01/23/16 1633 01/24/16 0741 01/25/16 0649 01/26/16 0358  NA 134* 132* 130* 130* 131*  K 3.7 4.1 3.8 3.8 3.6  CL 104 102 102 101 102  CO2 24 22 24 23 22   GLUCOSE 96 82 89 87 105*  BUN 11 17 19 20 20   CREATININE 1.14 1.78* 1.83* 2.11* 2.09*  CALCIUM 7.9* 8.5* 7.9* 7.9* 8.0*  MG 1.7  --  1.6*  --   --   AST 27 35 21  --  20  ALT 15* 17 11*  --  10*  ALKPHOS 77 99 74  --  63  BILITOT 1.0 1.3* 0.8  --  1.0   ------------------------------------------------------------------------------------------------------------------ estimated creatinine clearance is 31.5 mL/min (by C-G formula based on Cr of 2.09). ------------------------------------------------------------------------------------------------------------------ No results for input(s): HGBA1C in the last 72  hours. ------------------------------------------------------------------------------------------------------------------ No results for input(s): CHOL, HDL, LDLCALC, TRIG, CHOLHDL, LDLDIRECT in the last 72 hours. ------------------------------------------------------------------------------------------------------------------ No results for input(s): TSH, T4TOTAL, T3FREE, THYROIDAB in the last 72 hours.  Invalid input(s): FREET3 ------------------------------------------------------------------------------------------------------------------  No results for input(s): VITAMINB12, FOLATE, FERRITIN, TIBC, IRON, RETICCTPCT in the last 72 hours.  Coagulation profile  Recent Labs Lab 01/23/16 1613  INR 2.19*    No results for input(s): DDIMER in the last 72 hours.  Cardiac Enzymes No results for input(s): CKMB, TROPONINI, MYOGLOBIN in the last 168 hours.  Invalid input(s): CK ------------------------------------------------------------------------------------------------------------------ Invalid input(s): POCBNP     Time Spent in minutes   30 minutes   Vadie Principato M.D on 01/26/2016 at 4:07 PM  Between 7am to 7pm - Pager - [phone]  After 7pm go to [email or web address] - password [email or web address]  [facility]   Office  [phone]

## 2016-01-26 NOTE — Progress Notes (Signed)
Daily Progress Note   Patient Name: Antonio Hancock       Date: 01/26/2016 DOB: 1941-03-26  Age: 75 y.o. MRN#: [id number] Attending Physician: Albertine Patricia, MD Primary Care Physician: Woody Seller, MD Admit Date: 01/23/2016  Reason for Consultation/Follow-up: Establishing goals of care and Hospice Evaluation  Subjective: I met with Antonio Hancock, his wife, and his 2 sisters. He reports talking with Dr. Alen Blew this morning and he has been considering the options that were discussed with him at that time.  He tells me that he is not planning on having any further chemotherapy. When asked what is most important, he stated getting home as his main goal.  We discussed hospice and that the support that it could provide him if he were to discharge to home with hospice support. He stated his main concern is if hospice would cost him money, and he is very agreeable that the services they can provide would be helpful. I talked with him that hospice is generally covered by his insurance coverage and that this is something that they would discuss with him at time of enrollment.  Length of Stay: 1 day  Current Medications: Scheduled Meds:  . albumin human  25 g Intravenous Q6H  . ALPRAZolam  0.25 mg Oral QHS  . antiseptic oral rinse  7 mL Mouth Rinse q12n4p  . apixaban  5 mg Oral BID  . chlorhexidine  15 mL Mouth Rinse BID  . dronabinol  2.5 mg Oral QAC lunch  . feeding supplement (ENSURE ENLIVE)  237 mL Oral BID BM  . fluticasone  2 spray Each Nare Daily  . insulin aspart  0-5 Units Subcutaneous QHS  . insulin aspart  0-9 Units Subcutaneous TID WC  . lipase/protease/amylase  36,000 Units Oral TID WC  . megestrol  400 mg Oral BID  . morphine  30 mg Oral q morning - 10a  . nicotine  21 mg  Transdermal Daily  . pantoprazole  40 mg Oral Daily  . tamsulosin  0.4 mg Oral Daily    Continuous Infusions:    PRN Meds: acetaminophen **OR** acetaminophen, albuterol, diphenoxylate-atropine, HYDROcodone-acetaminophen, morphine injection, ondansetron **OR** ondansetron (ZOFRAN) IV, oxyCODONE, zolpidem  Physical Exam: Physical Exam      General: Chronically ill-appearing. Alert, awake, in no acute distress.  HEENT: No bruits,  no goiter, no JVD Heart: Regular rate and rhythm. No murmur appreciated. Lungs: Good air movement, clear Abdomen: Soft, mildly tender, distended, positive bowel sounds.  Ext: Noted edema Skin: Warm and dry Neuro: Grossly intact, nonfocal.         Vital Signs: BP 111/66 mmHg  Pulse 69  Temp(Src) 97.7 F (36.5 C) (Oral)  Resp 18  Ht _0  (1.803 m)  Wt 71.8 kg (158 lb 4.6 oz)  BMI 22.09 kg/m2  SpO2 100% SpO2: SpO2: 100 % O2 Device: O2 Device: Not Delivered O2 Flow Rate:    Intake/output summary:  Intake/Output Summary (Last 24 hours) at 01/26/16 5701 Last data filed at 01/26/16 1300  Gross per 24 hour  Intake 5989.6 ml  Output    200 ml  Net 5789.6 ml   LBM: Last BM Date: 01/24/16 Baseline Weight: Weight: 73.1 kg (161 lb 2.5 oz) Most recent weight: Weight: 71.8 kg (158 lb 4.6 oz)       Palliative Assessment/Data: Flowsheet Rows        Most Recent Value   Intake Tab    Referral Department  Hospitalist   Unit at Time of Referral  Oncology Unit   Palliative Care Primary Diagnosis  Cancer   Date Notified  01/23/16   Palliative Care Type  New Palliative care   Reason for referral  Non-pain Symptom, Clarify Goals of Care   Date of Admission  01/23/16   Date first seen by Palliative Care  01/24/16   # of days Palliative referral response time  1 Day(s)   # of days IP prior to Palliative referral  0   Clinical Assessment    Palliative Performance Scale Score  50%   Pain Max last 24 hours  3   Pain Min Last 24 hours  0   Psychosocial &  Spiritual Assessment    Palliative Care Outcomes    Patient/Family meeting held?  Yes   Who was at the meeting?  Patient, his knees, his 2 sisters, and one son   Palliative Care Outcomes  Provided advance care planning      Additional Data Reviewed: CBC    Component Value Date/Time   WBC 1.9* 01/26/2016 0358   WBC 2.6* 01/08/2016 1139   RBC 2.71* 01/26/2016 0358   RBC 3.59* 01/08/2016 1139   HGB 8.4* 01/26/2016 0358   HGB 11.1* 01/08/2016 1139   HCT 24.3* 01/26/2016 0358   HCT 32.8* 01/08/2016 1139   PLT 107* 01/26/2016 0358   PLT 187 01/08/2016 1139   MCV 89.7 01/26/2016 0358   MCV 91.2 01/08/2016 1139   MCH 31.0 01/26/2016 0358   MCH 30.8 01/08/2016 1139   MCHC 34.6 01/26/2016 0358   MCHC 33.8 01/08/2016 1139   RDW 16.8* 01/26/2016 0358   RDW 16.7* 01/08/2016 1139   LYMPHSABS 0.9 01/23/2016 1613   LYMPHSABS 0.1* 01/08/2016 1139   MONOABS 0.7 01/23/2016 1613   MONOABS 0.6 01/08/2016 1139   EOSABS 0.6 01/23/2016 1613   EOSABS 0.1 01/08/2016 1139   BASOSABS 0.0 01/23/2016 1613   BASOSABS 0.0 01/08/2016 1139    CMP     Component Value Date/Time   NA 131* 01/26/2016 0358   NA 127* 01/08/2016 1139   K 3.6 01/26/2016 0358   K 4.5 01/08/2016 1139   CL 102 01/26/2016 0358   CO2 22 01/26/2016 0358   CO2 17* 01/08/2016 1139   GLUCOSE 105* 01/26/2016 0358   GLUCOSE 90 01/08/2016 1139   BUN 20 01/26/2016  0358   BUN 20.1 01/08/2016 1139   CREATININE 2.09* 01/26/2016 0358   CREATININE 1.7* 01/08/2016 1139   CALCIUM 8.0* 01/26/2016 0358   CALCIUM 8.6 01/08/2016 1139   PROT 4.9* 01/26/2016 0358   PROT 6.8 01/08/2016 1139   ALBUMIN 2.9* 01/26/2016 0358   ALBUMIN 2.8* 01/08/2016 1139   AST 20 01/26/2016 0358   AST 28 01/08/2016 1139   ALT 10* 01/26/2016 0358   ALT 16 01/08/2016 1139   ALKPHOS 63 01/26/2016 0358   ALKPHOS 141 01/08/2016 1139   BILITOT 1.0 01/26/2016 0358   BILITOT 1.51* 01/08/2016 1139   GFRNONAA 30* 01/26/2016 0358   GFRAA 34* 01/26/2016 0358        Problem List:  Patient Active Problem List   Diagnosis Date Noted  . Acute renal failure (ARF) ([place]) 01/23/2016  . Anasarca 01/23/2016  . Hypokalemia 01/16/2016  . Antineoplastic chemotherapy induced pancytopenia ([place]) 01/15/2016  . Acute cystitis without hematuria   . Protein-calorie malnutrition, severe 01/14/2016  . Neonatal sepsis due to group B Streptococcus ([place])   . Nausea with vomiting 01/13/2016  . Chills without fever 01/13/2016  . Hypomagnesemia 01/13/2016  . Ascites 01/13/2016  . Pneumonia 01/13/2016  . Colitis 01/13/2016  . Severe sepsis ([place]) 01/12/2016  . Chronic diastolic CHF (congestive heart failure), NYHA class 1 ([place]) 01/12/2016  . CKD (chronic kidney disease) stage 3, GFR 30-59 ml/min 01/12/2016  . Diarrhea 01/11/2016  . Hyperbilirubinemia 01/11/2016  . Hypoalbuminemia due to protein-calorie malnutrition ([place]) 01/11/2016  . Dehydration 01/08/2016  . Malignant neoplasm of head of pancreas ([place]) 11/27/2015  . Jaundice 10/23/2015  . Hyponatremia 10/23/2015  . Transaminitis 10/23/2015  . Pancreatic mass 10/23/2015  . Renal mass   . Renal mass, right   . Chronotropic incompetence with sinus node dysfunction ([facility]) 11/14/2013  . Obstructive sleep apnea 11/14/2012  . Atrial fibrillation ([place]) 02/15/2012  . Pacemaker-Medtronic   . Diabetes mellitus ([place]) 11/11/2011  . 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 Hypertension   . Tobacco abuse      Palliative Care Assessment & Plan    1.Code Status:  DNR    Code Status Orders        Start     Ordered   01/23/16 2312  Do not attempt resuscitation (DNR)   Continuous    Question Answer Comment  In the event of cardiac or respiratory ARREST Do not call a "code blue"   In the event of cardiac or respiratory ARREST Do not perform Intubation, CPR, defibrillation or ACLS   In the event of cardiac or respiratory ARREST Use medication by any route, position, wound care, and other measures  to relive pain and suffering. May use oxygen, suction and manual treatment of airway obstruction as needed for comfort.      01/23/16 2311    Code Status History    Date Active Date Inactive Code Status Order ID Comments User Context   01/13/2016  1:21 AM 01/21/2016  7:35 PM DNR [id number]  Toy Baker, MD Inpatient   10/23/2015 12:23 PM 10/25/2015  2:19 PM Full Code [id number]  Theodis Blaze, MD Inpatient       2. Goals of Care/Additional Recommendations:  He reports understanding that further disease modifying therapy is not guaranteed to help him live longer and better. He does not want to pursue any further chemotherapy. He would like to transition home with hospice support.  Limitations on Scope of Treatment: Avoid Hospitalization  3. Symptom Management:  Well controlled at this time. Continue same  4. Palliative Prophylaxis:   Aspiration, Bowel Regimen, Delirium Protocol and Frequent Pain Assessment  5. Prognosis: Less than 6 months if his disease follows its natural course  6. Discharge Planning:  Home with Hospice   Care plan was discussed with patient, his wife, his 2 sisters, and Dr. Waldron Labs  Thank you for allowing the Palliative Medicine Team to assist in the care of this patient.   Time In: 1715 Time Out: 1750 Total Time 35 Prolonged Time Billed No        Micheline Rough, MD  01/26/2016, 6:52 PM  Please contact Palliative Medicine Team phone at [phone] for questions and concerns.

## 2016-01-26 NOTE — Telephone Encounter (Signed)
Wife stopped by to move 3/1 lab/fu to PM due to transportation. Wife given new schedule.

## 2016-01-26 NOTE — Progress Notes (Signed)
IP PROGRESS NOTE  Subjective:   Patient known to me with history of pancreatic cancer had been receiving radiation therapy with oral Xeloda. Patient have had recurrent hospitalizations and was discharged recently on 01/21/2016 and was readmitted on 01/23/2016. He was found to have generalized anasarca in overall decline in his health. He had a repeat paracentesis on 01/20/2016 which showed no evidence of malignant cells.   Today he appears comfortable without distress. He does have slight increase in has ascites and lower extremity edema. He does not report increased pain. He is able to eat but does have early satiety.  Objective:  Vital signs in last 24 hours: Temp:  [97.4 F (36.3 C)-98.3 F (36.8 C)] 97.8 F (36.6 C) (02/20 1046) Pulse Rate:  [69-87] 75 (02/20 1046) Resp:  [12-18] 16 (02/20 1046) BP: (75-112)/(44-63) 110/58 mmHg (02/20 1046) SpO2:  [97 %-100 %] 99 % (02/20 1046) Weight change:  Last BM Date: 01/24/16  Intake/Output from previous day: 02/19 0701 - 02/20 0700 In: 6229.6 [P.O.:480; I.V.:5214.6; Blood:335; IV Piggyback:200] Out: 400 [Urine:400] Chronically ill-appearing gentleman without distress. Mouth: mucous membranes moist, pharynx normal without lesions Resp: clear to auscultation bilaterally Cardio: regular rate and rhythm, S1, S2 normal, no murmur, click, rub or gallop GI: Distended with shifting dullness. Extremities: edema Bilaterally noted.  Lab Results:  Recent Labs  01/25/16 0649 01/26/16 0358  WBC 3.5* 1.9*  HGB 8.7* 8.4*  HCT 24.9* 24.3*  PLT 120* 107*    BMET  Recent Labs  01/25/16 0649 01/26/16 0358  NA 130* 131*  K 3.8 3.6  CL 101 102  CO2 23 22  GLUCOSE 87 105*  BUN 20 20  CREATININE 2.11* 2.09*  CALCIUM 7.9* 8.0*     Medications: I have reviewed the patient's current medications.  Assessment/Plan:  1. Locally advanced pancreatic cancer. Currently receiving radiation therapy was oral Xeloda and therapy is on hold for  the time being given his recurrent hospitalizations and decline. Treatment goal has been to palliate his cancer and cure is very unlikely.   Given his recent decline, generalized anasarca and recurrent hospitalization I see very little benefit from the treatment he has received. His prognosis is rather poor and likely has developed metastatic disease contributing to his generalized ascites and anasarca.  It natural course of this disease was discussed with the patient and his wife. He understand that risk is a very poor prognosis and systemic chemotherapy can be used for palliation. I reviewed the option of considering systemic chemotherapy versus enrolling in hospice. He will think about that and let me know in the near future. He understands that even with chemotherapy, the benefit would likely be short-lived and will require hospice regardless.  2. Anasarca and ascites: He might benefit from a repeat paracentesis prior to discharge and fluid for cytology.  3. Follow-up: He is an outpatient oncology follow-up upon his discharge.    LOS: 1 day   [facility] 01/26/2016, 12:04 PM

## 2016-01-26 NOTE — Procedures (Signed)
Ultrasound-guided diagnostic and therapeutic paracentesis performed yielding 4.1 liters of slightly hazy, light yellow  fluid. No immediate complications. A portion of the fluid was submitted to the lab for preordered studies.

## 2016-01-26 NOTE — Progress Notes (Signed)
Patient was unable to void since foley cath was removed at approximately 1615 yesterday. A bladder scan was done at approximately 2235 which showed volume >200cc. An I&O cath was done at 2248 with 100cc amber urine returned. However, patient has since voided 200cc of urine at approximately 0300.

## 2016-01-26 NOTE — Progress Notes (Signed)
PT Cancellation Note  Patient Details Name: Antonio Hancock MRN: [id number] DOB: 10-07-1941   Cancelled Treatment:    Reason Eval/Treat Not Completed: Patient at procedure or test/unavailable   [facility] 01/26/2016, 2:45 PM

## 2016-01-27 LAB — BASIC METABOLIC PANEL
Anion gap: 5 (ref 5–15)
BUN: 17 mg/dL (ref 6–20)
CHLORIDE: 107 mmol/L (ref 101–111)
CO2: 23 mmol/L (ref 22–32)
CREATININE: 1.75 mg/dL — AB (ref 0.61–1.24)
Calcium: 8.5 mg/dL — ABNORMAL LOW (ref 8.9–10.3)
GFR calc Af Amer: 42 mL/min — ABNORMAL LOW (ref >60)
GFR calc non Af Amer: 37 mL/min — ABNORMAL LOW (ref >60)
Glucose, Bld: 92 mg/dL (ref 65–99)
Potassium: 3.8 mmol/L (ref 3.5–5.1)
SODIUM: 135 mmol/L (ref 135–145)

## 2016-01-27 LAB — GLUCOSE, CAPILLARY
GLUCOSE-CAPILLARY: 85 mg/dL (ref 65–99)
GLUCOSE-CAPILLARY: 84 mg/dL (ref 65–99)
GLUCOSE-CAPILLARY: 96 mg/dL (ref 65–99)

## 2016-01-27 LAB — CBC
HCT: 24.6 % — ABNORMAL LOW (ref 39.0–52.0)
HEMOGLOBIN: 8.5 g/dL — AB (ref 13.0–17.0)
MCH: 30.7 pg (ref 26.0–34.0)
MCHC: 34.6 g/dL (ref 30.0–36.0)
MCV: 88.8 fL (ref 78.0–100.0)
PLATELETS: 108 10*3/uL — AB (ref 150–400)
RBC: 2.77 MIL/uL — ABNORMAL LOW (ref 4.22–5.81)
RDW: 16.7 % — ABNORMAL HIGH (ref 11.5–15.5)
WBC: 1.7 10*3/uL — ABNORMAL LOW (ref 4.0–10.5)

## 2016-01-27 MED ORDER — ACETAMINOPHEN 325 MG PO TABS: 650.0000 mg | ORAL_TABLET | Freq: Four times a day (QID) | ORAL | Status: AC | PRN

## 2016-01-27 MED ORDER — MORPHINE SULFATE ER 30 MG PO TBCR: 30.0000 mg | EXTENDED_RELEASE_TABLET | Freq: Every morning | ORAL | Status: AC

## 2016-01-27 MED ORDER — FUROSEMIDE 20 MG PO TABS
20.0000 mg | ORAL_TABLET | Freq: Every day | ORAL | Status: AC
Start: 2016-01-27 — End: ?

## 2016-01-27 MED ORDER — TAMSULOSIN HCL 0.4 MG PO CAPS: 0.4000 mg | ORAL_CAPSULE | Freq: Every day | ORAL | Status: AC

## 2016-01-27 MED ORDER — OXYCODONE HCL 5 MG PO TABS: 5.0000 mg | ORAL_TABLET | ORAL | Status: AC | PRN

## 2016-01-27 MED ORDER — PROMETHAZINE HCL 12.5 MG PO TABS: 12.5000 mg | ORAL_TABLET | Freq: Four times a day (QID) | ORAL | Status: AC | PRN

## 2016-01-27 MED ORDER — ALPRAZOLAM 0.25 MG PO TABS: 0.2500 mg | ORAL_TABLET | Freq: Every day | ORAL | Status: AC

## 2016-01-27 NOTE — Discharge Summary (Signed)
Antonio Hancock, is a 75 y.o. male  DOB Dec 04, 1941  MRN [id number].  Admission date:  01/23/2016  Admitting Physician  Toy Baker, MD  Discharge Date:  01/27/2016   Primary MD  Woody Seller, MD  Recommendations for primary care physician for things to follow:  - Management as per hospice - Patient may need paracentesis on as-needed basis for comfort   Admission Diagnosis  Edema due to malnutrition, due to unspecified malnutrition type [facility]) [E43]   Discharge Diagnosis  Edema due to malnutrition, due to unspecified malnutrition type ([place]) [E43]   Active Problems:   Diabetes mellitus ([place])   Atrial fibrillation ([place])   Obstructive sleep apnea   Hyponatremia   Pancreatic mass   Malignant neoplasm of head of pancreas ([place])   Hypoalbuminemia due to protein-calorie malnutrition (HCC)   Chronic diastolic CHF (congestive heart failure), NYHA class 1 (HCC)   CKD (chronic kidney disease) stage 3, GFR 30-59 ml/min   Protein-calorie malnutrition, severe   Acute renal failure (ARF) (HCC)   Anasarca   Goals of care, counseling/discussion   Palliative care encounter      Past Medical History  Diagnosis Date  . Hypertension   . Diabetes mellitus     Diagnosed Fall 2012  . Mobitz type 2 second degree AV block     11/11/2011  . Tobacco abuse     1/2 ppd x 55 yrs  . Pacemaker 2012    Medtronic Adapta L pulse generator, serial number [id number]  . Chronotropic incompetence with sinus node dysfunction ([place]) 2012    s/p MDT PPM  . Hyperlipidemia   . HOH (hard of hearing)   . Anxiety     Past Surgical History  Procedure Laterality Date  . Back surgery    . Permanent pacemaker insertion N/A 11/12/2011    Procedure: PERMANENT PACEMAKER INSERTION;  Surgeon: Deboraha Sprang, MD;  Location: [facility] CATH LAB;  Service: Cardiovascular;  Laterality: Left; Medtronic Adapta L pulse generator, serial number  [id number]    . Hernia repair  1996    left inguinal hernia repair   . Ercp N/A 10/24/2015    Procedure: ENDOSCOPIC RETROGRADE CHOLANGIOPANCREATOGRAPHY (ERCP);  Surgeon: Carol Ada, MD;  Location: [facility];  Service: Endoscopy;  Laterality: N/A;  . Biliary stent placement N/A 10/24/2015    Procedure: BILIARY STENT PLACEMENT;  Surgeon: Carol Ada, MD;  Location: [facility] ENDOSCOPY;  Service: Endoscopy;  Laterality: N/A;  . Eus N/A 11/07/2015    Procedure: FULL UPPER ENDOSCOPIC ULTRASOUND (EUS) RADIAL;  Surgeon: Carol Ada, MD;  Location: [facility] ENDOSCOPY;  Service: Endoscopy;  Laterality: N/A;  . Fine needle aspiration N/A 11/07/2015    Procedure: FINE NEEDLE ASPIRATION (FNA) LINEAR;  Surgeon: Carol Ada, MD;  Location: [facility] ENDOSCOPY;  Service: Endoscopy;  Laterality: N/A;  . Ercp N/A 11/07/2015    Procedure: ENDOSCOPIC RETROGRADE CHOLANGIOPANCREATOGRAPHY (ERCP);  Surgeon: Carol Ada, MD;  Location: [facility];  Service: Endoscopy;  Laterality: N/A;       History of present illness and  Hospital  Course:     Kindly see H&P for history of present illness and admission details, please review complete Labs, Consult reports and Test reports for all details in brief  HPI  from the history and physical done on the day of admission 01/23/2016 Antonio Hancock is a 75 y.o. male   has a past medical history of Hypertension; Diabetes mellitus; Mobitz type 2 second degree AV block; Tobacco abuse; Pacemaker (2012); Chronotropic incompetence with sinus node dysfunction ([place]) (2012); Hyperlipidemia; HOH (hard of hearing); and Anxiety.   Presented with increased diffuse swelling including of lower and upper extremity as well as all of his abdomen. He's been having urinary retention has been reporting increasing nausea.  He denies any shortness of breath. The edema has started even while patient has been hospitalized recently for service but since discharge has progressed. Decreased PO intake. He have not  had diarrhea for the past 36 hours whhich is chronic for him. He has urinated onlyonce today his output has been slowing.  His abdominal distention has increased.  IN ER: ER provided discuss case with oncology who agreed with plan for admission. Blood pressure was noted to be down to 109/70 with isolated reading as low 71/57 after casted elevated at 2.2 albumin 2.8 white blood cell count 6.3 hemoglobin 11 platelets 195   Regarding pertinent past history: Few weeks ago patient was admitted for SIRS Infectious workup was concerning for possible pneumonia and urinary tract infection he received a full course of antibiotic treatment.Marland Kitchen He was noted to have ascites. Patient underwent a paracentesis on 01/15/2016 and on 01/20/2016. Cytology showed atypical cells. Patient will discharge home 2/15/17to the care of his wife to follow with oncology/radiation oncology.  Patient has been dealing with diarrhea side effects of radiation therapy for his pancreatic cancer and has required repeated visits to oncology Center for fluids. Patient has history of locally advanced pancreatic cancer diagnosed in November 2016 currently undergoing weekly radiation therapy status post ERCP and stent placement for biliary obstruction. currently on Xeloda. Patient has history of atrial fibrillation anticoagulated with Eliquis. He sp pacemaker due to symptomatic bradycardia. Patient used to have history of hypertension but had to discontinue all his blood pressure medications in the past secondary to recurrent hypotension in a setting of dehydration. Also had history of diabetes but given massive weight loss no longer needs any medications.     Hospitalist was called for admission for anasarca and decreased urine output    Hospital Course  75 year old male with history of hypertension, diabetes, Mobitz type II, tobacco abuse, hyperlipidemia, recent diagnosis of locally metastatic pancreatic cancer, followed by Dr.Shadad,  receiving radiation therapy and oral Xeloda, with recurrent hospitalization secondary to deconditioning, anasarca, and recurrent ascites, recurrent paracentesis with no evidence of malignant cells, presents with worsening anasarca and fatigue, workup significant for worsening renal function, anemia, and hyperalbuminemia with severe protein calorie malnutrition.  Anasarca with recurrent ascites - Appears to be secondary to hypoalbuminemia from severe protein calorie malnutrition. - Diuresis is on hold during hospital stay given worsening renal function, we'll discharge on low dose Lasix 20 mg oral daily - Recurrent paracentesis in the past with no evidence of malignant cells, only showing atypical cells - Status post bilateral thoracentesis 2/20 with 4.1 L drained, cytology still pending at time of discharge  Acute renal failure - This is prerenal in the setting of low urinary sodium and FeNa <1, and this is most likely due to third spacing from malnutrition and hypoalbuminemia. - discussed  with Dr. Florene Glen from nephrology, try to give colloid, and you with IV albumin, transfused 1 unit PRBC on 2/19 . - Patient started to develop signs of volume overload, worsening lower extremity edema, genital edema, no start IV fluids,. - Creatinine 1.7 and day of discharge, will discharge on low dose Lasix.  Hyponatremia - due to volume depletion as FeNa < 1%, and urine sodium < 10, sodium 135 at day of discharge.  severeProtein calorie malnutrition/hypoalbuminemia - Started on nutritional supplement, continue with Megace, received albumin transfusion   Pancreatic cancer - by oncology Dr. Rodena Piety., follow-up greatly appreciated, locally advanced pancreatic cancer, currently on radiation therapy, Xeloda is on hold. - Treatment goal has been to palliate his cancer, cure is unlikely. - Patient will be discharged home with home hospice  Chronic diastolic CHF - Even with anasarca, this appears to be more  likely due to malnutrition and hypoalbuminemia, no respiratory distress, no evidence of pulmonary overload.   Anemia of chronic disease - Patient received 1 unit PRBC on 2/19  diabetes mellitus  - Was on SSI during hospital stay, CBG has been consistently controlled doubt any treatment, will discontinue metformin time of discharge  Diarrhea - Chronic, C. difficile negative  Urinary retention -Hearted on Flomax, DC Foley catheter 2/19, no further evidence of urinary retention    Discharge Condition:  Stable   Follow UP  Follow-up Information    Follow up with Hospice at [facility].   Specialty:  Hospice and Palliative Medicine   Contact information:   [place] [address] [phone]         Discharge Instructions  and  Discharge Medications         Discharge Instructions    Diet - low sodium heart healthy    Complete by:  As directed      Discharge instructions    Complete by:  As directed   Follow with Primary MD Woody Seller, MD in 7 days    Activity: As tolerated with Full fall precautions use walker/cane & assistance as needed   Disposition Home WITH home hospice   Diet: Heart Healthy  , with feeding assistance and aspiration precautions.  For Heart failure patients - Check your Weight same time everyday, if you gain over 2 pounds, or you develop in leg swelling, experience more shortness of breath or chest pain, call your Primary MD immediately. Follow Cardiac Low Salt Diet and 1.5 lit/day fluid restriction.   On your next visit with your primary care physician please Get Medicines reviewed and adjusted.   Please request your Prim.MD to go over all Hospital Tests and Procedure/Radiological results at the follow up, please get all Hospital records sent to your Prim MD by signing hospital release before you go home.   If you experience worsening of your admission symptoms, develop shortness of breath, life threatening  emergency, suicidal or homicidal thoughts you must seek medical attention immediately by calling 911 or calling your MD immediately  if symptoms less severe.  You Must read complete instructions/literature along with all the possible adverse reactions/side effects for all the Medicines you take and that have been prescribed to you. Take any new Medicines after you have completely understood and accpet all the possible adverse reactions/side effects.   Do not drive, operating heavy machinery, perform activities at heights, swimming or participation in water activities or provide baby sitting services if your were admitted for syncope or siezures until you have seen by Primary MD or a  Neurologist and advised to do so again.  Do not drive when taking Pain medications.    Do not take more than prescribed Pain, Sleep and Anxiety Medications  Special Instructions: If you have smoked or chewed Tobacco  in the last 2 yrs please stop smoking, stop any regular Alcohol  and or any Recreational drug use.  Wear Seat belts while driving.   Please note  You were cared for by a hospitalist during your hospital stay. If you have any questions about your discharge medications or the care you received while you were in the hospital after you are discharged, you can call the unit and asked to speak with the hospitalist on call if the hospitalist that took care of you is not available. Once you are discharged, your primary care physician will handle any further medical issues. Please note that NO REFILLS for any discharge medications will be authorized once you are discharged, as it is imperative that you return to your primary care physician (or establish a relationship with a primary care physician if you do not have one) for your aftercare needs so that they can reassess your need for medications and monitor your lab values.     Increase activity slowly    Complete by:  As directed             Medication  List    STOP taking these medications        levofloxacin 750 MG tablet  Commonly known as:  LEVAQUIN     metFORMIN 500 MG 24 hr tablet  Commonly known as:  GLUCOPHAGE-XR      TAKE these medications        acetaminophen 325 MG tablet  Commonly known as:  TYLENOL  Take 2 tablets (650 mg total) by mouth every 6 (six) hours as needed for mild pain (or Fever >/= 101).     albuterol 108 (90 Base) MCG/ACT inhaler  Commonly known as:  PROVENTIL HFA;VENTOLIN HFA  Inhale 2 puffs into the lungs every 6 (six) hours as needed for wheezing or shortness of breath.     ALPRAZolam 0.25 MG tablet  Commonly known as:  XANAX  Take 1 tablet (0.25 mg total) by mouth at bedtime.     apixaban 5 MG Tabs tablet  Commonly known as:  ELIQUIS  Take 1 tablet (5 mg total) by mouth 2 (two) times daily.     atorvastatin 10 MG tablet  Commonly known as:  LIPITOR  Take 10 mg by mouth daily.     diphenoxylate-atropine 2.5-0.025 MG tablet  Commonly known as:  LOMOTIL  Take 2 tablets by mouth 4 (four) times daily as needed for diarrhea or loose stools.     feeding supplement (ENSURE ENLIVE) Liqd  Take 237 mLs by mouth 2 (two) times daily between meals.     fluticasone 50 MCG/ACT nasal spray  Commonly known as:  FLONASE  Place 2 sprays into both nostrils daily.     furosemide 20 MG tablet  Commonly known as:  LASIX  Take 1 tablet (20 mg total) by mouth daily.     lipase/protease/amylase 36000 UNITS Cpep capsule  Commonly known as:  CREON  Take 1 capsule (36,000 Units total) by mouth 3 (three) times daily with meals.     megestrol 400 MG/10ML suspension  Commonly known as:  MEGACE  Take 10 mLs (400 mg total) by mouth 2 (two) times daily.     morphine 30 MG 12 hr tablet  Commonly known as:  MS CONTIN  Take 1 tablet (30 mg total) by mouth every morning.     ondansetron 8 MG tablet  Commonly known as:  ZOFRAN  Take 1 tablet (8 mg total) by mouth every 8 (eight) hours as needed for nausea or  vomiting.     oxyCODONE 5 MG immediate release tablet  Commonly known as:  Oxy IR/ROXICODONE  Take 1 tablet (5 mg total) by mouth every 4 (four) hours as needed for moderate pain.     pantoprazole 40 MG tablet  Commonly known as:  PROTONIX  Take 40 mg by mouth daily.     promethazine 12.5 MG tablet  Commonly known as:  PHENERGAN  Take 1 tablet (12.5 mg total) by mouth every 6 (six) hours as needed for nausea or vomiting.     tamsulosin 0.4 MG Caps capsule  Commonly known as:  FLOMAX  Take 1 capsule (0.4 mg total) by mouth daily.     tiotropium 18 MCG inhalation capsule  Commonly known as:  SPIRIVA  Place 1 capsule (18 mcg total) into inhaler and inhale daily.          Diet and Activity recommendation: See Discharge Instructions above   Consults obtained -  Palliative medicine Oncology   Major procedures and Radiology Reports - PLEASE review detailed and final reports for all details, in brief -   Ultrasound-guided paracentesis 2/20 with 4.1 L drained   Ct Abdomen Pelvis Wo Contrast  01/13/2016  CLINICAL DATA:  Acute onset of nausea, vomiting, chills and fever. Diarrhea, status post radiation therapy for pancreatic cancer. Initial encounter. EXAM: CT ABDOMEN AND PELVIS WITHOUT CONTRAST TECHNIQUE: Multidetector CT imaging of the abdomen and pelvis was performed following the standard protocol without IV contrast. COMPARISON:  CT of the abdomen and pelvis from 11/14/2015 FINDINGS: Trace bilateral pleural effusions are noted, with bibasilar airspace opacity, which may reflect atelectasis or pneumonia. Pacemaker leads are partially imaged. Since the prior study, there is increased moderate volume ascites within the abdomen and pelvis. A biliary duct stent is noted in unchanged position, and is partially filled with air, with a large amount of pneumobilia noted within the liver. The spleen is mildly bulky but remains normal in size. Trace air is noted within the gallbladder. The  gallbladder is difficult to fully assess given ascites. The known pancreatic head mass is difficult to fully assess without contrast. The adrenal glands are grossly unremarkable. A 3.5 cm hyperdense mass along the lateral aspect of the right kidney is mildly increased in size and may reflect a complex renal cyst or malignancy. Nonspecific perinephric stranding is noted bilaterally. There is no evidence of hydronephrosis. No renal or ureteral stones are seen. Scattered vascular calcifications are seen at both renal hila. The small bowel is grossly unremarkable in appearance. The stomach is within normal limits. No acute vascular abnormalities are seen. 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 Diffuse calcification is noted along the abdominal aorta and its branches. The appendix remains normal in caliber and contains contrast, without evidence of appendicitis. Apparent diffuse wall thickening and mucosal edema is noted along the ascending colon, which may reflect mild infectious or inflammatory colitis. Scattered diverticulosis is noted along the descending and sigmoid colon, without evidence of diverticulitis. The bladder is mildly distended and grossly unremarkable. The prostate is enlarged, measuring 5.4 cm in transverse dimension. No inguinal lymphadenopathy is seen. No acute osseous abnormalities are identified. Thoracolumbar  spinal fusion hardware is noted. There is chronic compression deformity involving vertebral body T12, with underlying chronic osseous fusion. IMPRESSION: 1. Increasing moderate volume ascites within the abdomen and pelvis. 2. Apparent diffuse wall thickening and mucosal edema along the ascending colon, which may reflect mild infectious or inflammatory colitis, depending on the patient's symptoms. Alternatively, this may be related to the patient's ascites. 3. Trace bilateral pleural effusions, with bibasilar airspace opacity, which may  reflect atelectasis or pneumonia. 4. Biliary duct stent is noted in unchanged position, with a large amount of pneumobilia noted within the liver. 5. Known pancreatic head mass difficult to fully assess without contrast. 6. 3.5 cm hyperdense mass along the lateral aspect of the right kidney has mildly increased in size and may reflect a complex renal cyst for malignancy, as previously described. 7. Diffuse calcification along the abdominal aorta and its branches. 8. 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 Diffuse calcification along the abdominal aorta and its branches. Recommend followup by ultrasound in 3 years. This recommendation follows ACR consensus guidelines: White Paper of the ACR Incidental Findings Committee II on Vascular Findings. J Am Coll Radiol 2013; 10:789-794. 9. Scattered diverticulosis along the descending and sigmoid colon, without evidence of diverticulitis. 10. Enlarged prostate noted. Electronically Signed   By: Garald Balding M.D.   On: 01/13/2016 03:18   Dg Chest 2 View  01/23/2016  CLINICAL DATA:  Extremity edema. EXAM: CHEST  2 VIEW COMPARISON:  01/12/2016. FINDINGS: AP and lateral views of the chest show bibasilar atelectasis with symmetric small bilateral pleural effusions. Basilar pneumonia not completely excluded. The cardiopericardial silhouette is within normal limits for size. Bones are diffusely demineralized. Left permanent pacemaker again noted. Telemetry leads overlie the chest. Extensive thoracolumbar spinal fusion hardware again noted. IMPRESSION: New bibasilar collapse/ consolidation with small bilateral pleural effusions. Electronically Signed   By: Misty Stanley M.D.   On: 01/23/2016 17:20   Dg Chest 2 View  01/12/2016  CLINICAL DATA:  75 year old male with sepsis EXAM: CHEST  2 VIEW COMPARISON:  Chest CT dated 11/25/2015 FINDINGS: Two views of the chest do not demonstrate a focal consolidation. There is no pleural  effusion or pneumothorax. Bibasilar atelectatic changes most prominent on the right. The cardiac silhouette is within normal limits. Left pectoral pacemaker device. There is osteopenia with degenerative changes of the spine. Thoracolumbar fixation hardware noted. No acute osseous pathology. IMPRESSION: No active cardiopulmonary disease. Electronically Signed   By: Anner Crete M.D.   On: 01/12/2016 18:58   US Abdomen Limited  01/20/2016  CLINICAL DATA:  Ascites. EXAM: LIMITED ABDOMEN ULTRASOUND FOR ASCITES TECHNIQUE: Limited ultrasound survey for ascites was performed in all four abdominal quadrants. COMPARISON:  CT 01/13/2016 FINDINGS: Exam demonstrates moderate simple appearing ascites in all 4 quadrants. IMPRESSION: Moderate ascites. Electronically Signed   By: Marin Olp M.D.   On: 01/20/2016 13:35   US Paracentesis  01/26/2016  INDICATION: Pancreatic cancer, recurrent ascites. Request is made for diagnostic and therapeutic paracentesis. EXAM: ULTRASOUND GUIDED DIAGNOSTIC AND THERAPEUTIC PARACENTESIS MEDICATIONS: None. COMPLICATIONS: None immediate. PROCEDURE: Informed written consent was obtained from the patient after a discussion of the risks, benefits and alternatives to treatment. A timeout was performed prior to the initiation of the procedure. Initial ultrasound scanning demonstrates a moderate amount of ascites within the right upper to mid abdominal quadrant. The right upper to mid abdomen was prepped and draped in the usual sterile fashion. 1% lidocaine was used for local  anesthesia. Following this, a Yueh catheter was introduced. An ultrasound image was saved for documentation purposes. The paracentesis was performed. The catheter was removed and a dressing was applied. The patient tolerated the procedure well without immediate post procedural complication. FINDINGS: A total of approximately 4.1 of slightly hazy, light yellow fluid was removed. Samples were sent to the laboratory as  requested by the clinical team. IMPRESSION: Successful ultrasound-guided diagnostic and therapeutic paracentesis yielding 4.1 liters of peritoneal fluid. Read by: Rowe Robert, PA-C Electronically Signed   By: Sandi Mariscal M.D.   On: 01/26/2016 15:33   US Paracentesis  01/20/2016  INDICATION: Pancreatic cancer, recurrent ascites. Request is made for diagnostic and therapeutic paracentesis. EXAM: ULTRASOUND GUIDED DIAGNOSTIC AND THERAPEUTIC PARACENTESIS MEDICATIONS: None. COMPLICATIONS: None immediate. PROCEDURE: Informed written consent was obtained from the patient after a discussion of the risks, benefits and alternatives to treatment. A timeout was performed prior to the initiation of the procedure. Initial ultrasound scanning demonstrates a small to moderate amount of ascites within the right mid to upper abdominal quadrant. The right mid to upper abdomen was prepped and draped in the usual sterile fashion. 1% lidocaine was used for local anesthesia. Following this, a Yueh catheter was introduced. An ultrasound image was saved for documentation purposes. The paracentesis was performed. The catheter was removed and a dressing was applied. The patient tolerated the procedure well without immediate post procedural complication. FINDINGS: A total of approximately 2 liters of clear, light yellow fluid was removed. Samples were sent to the laboratory as requested by the clinical team. IMPRESSION: Successful ultrasound-guided diagnostic and therapeutic paracentesis yielding 2 liters of peritoneal fluid. Read by: Rowe Robert, PA-C Electronically Signed   By: Markus Daft M.D.   On: 01/20/2016 17:07   US Paracentesis  01/15/2016  INDICATION: History of pancreatic cancer with new onset ascites. Request has been made for diagnostic and therapeutic paracentesis EXAM: ULTRASOUND GUIDED right upper quadrant PARACENTESIS MEDICATIONS: 1% lidocaine COMPLICATIONS: None immediate. PROCEDURE: Informed written consent was  obtained from the patient after a discussion of the risks, benefits and alternatives to treatment. A timeout was performed prior to the initiation of the procedure. Initial ultrasound scanning demonstrates a moderate amount of ascites within the right upper abdominal quadrant. The right upper abdomen was prepped and draped in the usual sterile fashion. 1% lidocaine was used for local anesthesia. Following this, a Safe-T-Centesis catheter was introduced. An ultrasound image was saved for documentation purposes. The paracentesis was performed. The catheter was removed and a dressing was applied. The patient tolerated the procedure well without immediate post procedural complication. FINDINGS: A total of approximately 3.7 L of clear yellow fluid was removed. Samples were sent to the laboratory as requested by the clinical team. IMPRESSION: Successful ultrasound-guided paracentesis yielding 3.7 liters of peritoneal fluid. Read by: Saverio Danker, PA-C Electronically Signed   By: Lucrezia Europe M.D.   On: 01/15/2016 14:47    Micro Results     Recent Results (from the past 240 hour(s))  Anaerobic culture     Status: None   Collection Time: 01/20/16  5:25 PM  Result Value Ref Range Status   Specimen Description ASCITIC  Final   Special Requests NONE  Final   Gram Stain   Final    NO WBC SEEN NO ORGANISMS SEEN Performed at [facility]    Culture   Final    NO ANAEROBES ISOLATED Performed at [facility]    Report Status 01/25/2016 FINAL  Final  Culture, body fluid-bottle     Status: None   Collection Time: 01/20/16  5:26 PM  Result Value Ref Range Status   Specimen Description FLUID ASCITIC  Final   Special Requests BOTTLES DRAWN AEROBIC AND ANAEROBIC 10CC  Final   Culture   Final    NO GROWTH 5 DAYS Performed at [facility]    Report Status 01/25/2016 FINAL  Final  Gram stain     Status: None   Collection Time: 01/20/16  5:26 PM  Result Value Ref Range Status    Specimen Description FLUID ASCITIC  Final   Special Requests NONE  Final   Gram Stain   Final    CYTOSPIN SMEAR WBC PRESENT,BOTH PMN AND MONONUCLEAR NO ORGANISMS SEEN Performed at [facility]    Report Status 01/21/2016 FINAL  Final  Urine culture     Status: None   Collection Time: 01/24/16 12:47 AM  Result Value Ref Range Status   Specimen Description URINE, CATHETERIZED  Final   Special Requests NONE  Final   Culture   Final    NO GROWTH 1 DAY Performed at [facility]    Report Status 01/25/2016 FINAL  Final  C difficile quick scan w PCR reflex     Status: None   Collection Time: 01/24/16 10:10 AM  Result Value Ref Range Status   C Diff antigen NEGATIVE NEGATIVE Final   C Diff toxin NEGATIVE NEGATIVE Final   C Diff interpretation Negative for toxigenic C. difficile  Final  Gram stain     Status: None   Collection Time: 01/26/16  3:15 PM  Result Value Ref Range Status   Specimen Description FLUID PERITONEAL  Final   Special Requests NONE  Final   Gram Stain   Final    RARE WBC PRESENT,BOTH PMN AND MONONUCLEAR NO ORGANISMS SEEN Performed at [facility]    Report Status 01/26/2016 FINAL  Final       Today   Subjective:   Antonio Hancock today has no headache,no chest pain , reported abdominal pain significantly improved after paracentesis , improved by mouth intake over last 24 hours . Objective:   Blood pressure 110/59, pulse 62, temperature 98.5 F (36.9 C), temperature source Oral, resp. rate 16, height 5\' 11"  (1.803 m), weight 71.8 kg (158 lb 4.6 oz), SpO2 97 %.   Intake/Output Summary (Last 24 hours) at 01/27/16 1232 Last data filed at 01/27/16 0843  Gross per 24 hour  Intake   1202 ml  Output      0 ml  Net   1202 ml    Exam Awake Alert, Oriented X 3, frail, chronically ill-appearing [place].AT,PERRAL Supple Neck,No JVD Symmetrical Chest wall movement, Good air movement bilaterally, No Gallops,Rubs or new Murmurs, No Parasternal  Heave +ve B.Sounds, Abd Soft, No tenderness, with significant ascites today (patient examined before paracentesis ) No Cyanosi+1 edema bilaterally   Data Review   CBC w Diff:  Lab Results  Component Value Date   WBC 1.7* 01/27/2016   WBC 2.6* 01/08/2016   HGB 8.5* 01/27/2016   HGB 11.1* 01/08/2016   HCT 24.6* 01/27/2016   HCT 32.8* 01/08/2016   PLT 108* 01/27/2016   PLT 187 01/08/2016   LYMPHOPCT 15 01/23/2016   LYMPHOPCT 2.3* 01/08/2016   MONOPCT 11 01/23/2016   MONOPCT 21.4* 01/08/2016   EOSPCT 10 01/23/2016   EOSPCT 2.9 01/08/2016   BASOPCT 0 01/23/2016   BASOPCT 0.2 01/08/2016    CMP:  Lab Results  Component Value Date   NA 135 01/27/2016   NA 127* 01/08/2016   K 3.8 01/27/2016   K 4.5 01/08/2016   CL 107 01/27/2016   CO2 23 01/27/2016   CO2 17* 01/08/2016   BUN 17 01/27/2016   BUN 20.1 01/08/2016   CREATININE 1.75* 01/27/2016   CREATININE 1.7* 01/08/2016   PROT 4.9* 01/26/2016   PROT 6.8 01/08/2016   ALBUMIN 2.9* 01/26/2016   ALBUMIN 2.8* 01/08/2016   BILITOT 1.0 01/26/2016   BILITOT 1.51* 01/08/2016   ALKPHOS 63 01/26/2016   ALKPHOS 141 01/08/2016   AST 20 01/26/2016   AST 28 01/08/2016   ALT 10* 01/26/2016   ALT 16 01/08/2016  .   Total Time in preparing paper work, data evaluation and todays exam - 35 minutes  Fiore Detjen M.D on 01/27/2016 at 12:32 PM  [facility]   Office  [phone]

## 2016-01-27 NOTE — Discharge Instructions (Signed)
Follow with Primary MD Woody Seller, MD in 7 days    Activity: As tolerated with Full fall precautions use walker/cane & assistance as needed   Disposition Home WITH home hospice   Diet: Heart Healthy  , with feeding assistance and aspiration precautions.  For Heart failure patients - Check your Weight same time everyday, if you gain over 2 pounds, or you develop in leg swelling, experience more shortness of breath or chest pain, call your Primary MD immediately. Follow Cardiac Low Salt Diet and 1.5 lit/day fluid restriction.   On your next visit with your primary care physician please Get Medicines reviewed and adjusted.   Please request your Prim.MD to go over all Hospital Tests and Procedure/Radiological results at the follow up, please get all Hospital records sent to your Prim MD by signing hospital release before you go home.   If you experience worsening of your admission symptoms, develop shortness of breath, life threatening emergency, suicidal or homicidal thoughts you must seek medical attention immediately by calling 911 or calling your MD immediately  if symptoms less severe.  You Must read complete instructions/literature along with all the possible adverse reactions/side effects for all the Medicines you take and that have been prescribed to you. Take any new Medicines after you have completely understood and accpet all the possible adverse reactions/side effects.   Do not drive, operating heavy machinery, perform activities at heights, swimming or participation in water activities or provide baby sitting services if your were admitted for syncope or siezures until you have seen by Primary MD or a Neurologist and advised to do so again.  Do not drive when taking Pain medications.    Do not take more than prescribed Pain, Sleep and Anxiety Medications  Special Instructions: If you have smoked or chewed Tobacco  in the last 2 yrs please stop smoking, stop any regular  Alcohol  and or any Recreational drug use.  Wear Seat belts while driving.   Please note  You were cared for by a hospitalist during your hospital stay. If you have any questions about your discharge medications or the care you received while you were in the hospital after you are discharged, you can call the unit and asked to speak with the hospitalist on call if the hospitalist that took care of you is not available. Once you are discharged, your primary care physician will handle any further medical issues. Please note that NO REFILLS for any discharge medications will be authorized once you are discharged, as it is imperative that you return to your primary care physician (or establish a relationship with a primary care physician if you do not have one) for your aftercare needs so that they can reassess your need for medications and monitor your lab values.

## 2016-01-27 NOTE — Progress Notes (Signed)
I stopped by to check in on Antonio Hancock this morning. He was very clear about his desire to discharge home with hospice support as soon as possible. He denied any other needs at this time.  Please let us know if we can be of further assistance in the care of Antonio Hancock.  Micheline Rough, MD [facility] [phone]

## 2016-01-27 NOTE — Progress Notes (Signed)
CSW received notification that pt discharging home with hospice and needs ambulance transport.   CSW confirmed address with pt family at bedside ([place] [address]).   Pt family to notify RN when equipment delivered.   RN to call [facility] when notified equipment has been delivered. (If before 5 pm, Call [facility] at [phone]; if after 5 pm call [facility] at [phone] option 1 for English, option 3 for non-emergency transport). RN aware and agreeable to arrange.   No further social work needs identified at this time.  CSW signing off.   Alison Murray, MSW, [place] [facility] [facility] [phone]

## 2016-01-27 NOTE — Progress Notes (Signed)
Notified by [place] of family request for Hospice and [place] services at home after discharge. Chart and patient Information currently under review to confirm hospice eligibility.   Spoke with patient and 2 sisters, at bedside to initiate education related to hospice philosophy, services and team approach to care. Family verbalized understanding of the information provided. Per discussion, plan is for discharge to home by [facility] today.  Patient wishes to go home for comfort care only.  Please send signed, completed DNR form home with patient.  Patient will need prescriptions for discharge comfort medications.   DME needs discussed and family requested hospital bed with 1/2 rails, bedside table, 3 N 1, hoyer lift, W/C, and tubseat for delivery to the home today.  [facility] equipment manager Jewel Ysidro Evert notified and will contact [place] to arrange delivery to the home.  The home address has been verified and is correct in the chart; wife, Fraser Din to be contacted to arrange time of delivery.   [facility] Referral Center aware of the above.  Completed discharge summary will need to be faxed to [facility] at [phone] when final.  Please notify [facility] when patient is ready to leave unit at discharge-call [phone].   [facility] information and contact numbers have been given to sister, Bonnita Nasuti during visit, per patient request.  Above information shared with Alinda Sierras, [facility].   Please call with any questions.  Freddi Starr RN, [facility] Liaison  [phone]

## 2016-01-27 NOTE — Evaluation (Signed)
Physical Therapy Evaluation & discharge Patient Details Name: Antonio Hancock MRN: [id number] DOB: Jan 10, 1941 Today's Date: 01/27/2016   History of Present Illness  Patient is a 75 year old male with history of hypertension, diabetes, secondary AV block, tobacco abuse, hyperlipidemia, anxiety, and pancreatic cancer that was diagnosed in November 2016. He has been undergoing radiation therapy and is status post ERCP and stent placement for biliary obstruction. He has also been taking Xeloda but this is currently on hold. He is admitted after presenting with increased diffuse swelling of the extremities and abdomen in conjunction with urinary retention and increasing nausea.   Clinical Impression  Pt too fatigued to get up with PT as he had just returned to bed from [facility] with nurse tech. Nurse tech stated he as a one person MIN A transfer.  Eval focused on home equipment recommendations and family education as pt is scheduled to go home with hospice today per niece.  Pt is going home in family car and discussed best entry point in the home and niece was educated on how to A pt with stairs.  Recommend hospital bed and w/c.  No further PT needs and will sign off.    Follow Up Recommendations Supervision for mobility/OOB;Other (comment) (hospice)    Equipment Recommendations  Wheelchair (measurements PT);Hospital bed    Recommendations for Other Services       Precautions / Restrictions        Mobility  Bed Mobility               General bed mobility comments: Pt too fatigued to try, had just returned to bed after having diarrhea in [facility].  CNA reports pt was a 1 person MIN A transfer  Transfers                    Ambulation/Gait                Stairs            Wheelchair Mobility    Modified Rankin (Stroke Patients Only)       Balance                                             Pertinent Vitals/Pain      Home Living  Family/patient expects to be discharged to:: Private residence Living Arrangements: Spouse/significant other Available Help at Discharge: Family;Available 24 hours/day Type of Home: House Home Access: Stairs to enter   [employer] of Steps: 1-2 at garage, 5 at front Home Layout: One level Home Equipment: [place] - 2 wheels;Bedside commode      Prior Function Level of Independence: Independent with assistive device(s)               Hand Dominance        Extremity/Trunk Assessment   Upper Extremity Assessment: Generalized weakness           Lower Extremity Assessment: Generalized weakness         Communication   Communication: HOH  Cognition Arousal/Alertness: Awake/alert Behavior During Therapy: WFL for tasks assessed/performed Overall Cognitive Status: Within Functional Limits for tasks assessed                      General Comments General comments (skin integrity, edema, etc.): Pt too fatigued from having 3 episodes of diarrhea and just got back  into bed from [facility] with CNA.  Eval focused on home equipment recommendations and niece was educated on how to A pt with stair entrance as pt is scheduled to go home with hospice this afternoon.    Exercises        Assessment/Plan    PT Assessment Patent does not need any further PT services  PT Diagnosis Generalized weakness   PT Problem List    PT Treatment Interventions     PT Goals (Current goals can be found in the Care Plan section) Acute Rehab PT Goals Patient Stated Goal: home PT Goal Formulation: All assessment and education complete, DC therapy    Frequency     Barriers to discharge        Co-evaluation               End of Session   Activity Tolerance: Patient limited by fatigue Patient left: in bed;with call bell/phone within reach;with family/visitor present           Time: [id number] PT Time Calculation (min) (ACUTE ONLY): 12 min   Charges:   PT  Evaluation $PT Eval Low Complexity: 1 Procedure     PT G Codes:        Antonio Hancock 01/27/2016, 9:46 AM

## 2016-01-27 NOTE — Care Management Note (Signed)
Case Management Note  Patient Details  Name: Antonio Hancock MRN: [id number] Date of Birth: 1941-02-23  Subjective/Objective:      75 yo admitted with Protein-calorie malnutrition              Action/Plan: From home with spouse.  Expected Discharge Date:   (unknown)               Expected Discharge Plan:  Home w Hospice Care  In-House Referral:     Discharge planning Services  CM Consult  Post Acute Care Choice:  Hospice Choice offered to:  Patient, Sibling  DME Arranged:    DME Agency:     HH Arranged:  Disease Management [place] Agency:  [facility]  Status of Service:  In process, will continue to follow  Medicare Important Message Given:    Date Medicare IM Given:    Medicare IM give by:    Date Additional Medicare IM Given:    Additional Medicare Important Message give by:     If discussed at [place] of Stay Meetings, dates discussed:    Additional Comments: This CM met with pt and pt niece at bedside to discuss discharge planning. Per pt and family pt would like to go home with hospice. Choice was offered for home hospice and [facility] was chosen. [facility] rep was contacted for referral. Pt will need ambulance transport home. CSW made aware. Lynnell Catalan, RN 01/27/2016, 1:17 PM

## 2016-01-31 LAB — CULTURE, BODY FLUID-BOTTLE

## 2016-01-31 LAB — CULTURE, BODY FLUID W GRAM STAIN -BOTTLE: Culture: NO GROWTH

## 2016-02-04 ENCOUNTER — Other Ambulatory Visit: Payer: BLUE CROSS/BLUE SHIELD

## 2016-02-04 ENCOUNTER — Ambulatory Visit: Payer: BLUE CROSS/BLUE SHIELD | Admitting: Oncology

## 2016-02-10 NOTE — Progress Notes (Signed)
  Radiation Oncology         [phone] ________________________________  Name: Antonio Hancock MRN: [id number]  Date: 01/20/2016  DOB: [birth year]  End of Treatment Note  Diagnosis:   Pancreatic cancer     Indication for treatment::  curative       Radiation treatment dates:   12/11/2015 through 01/20/2016  Site/dose:   The patient was treated for his pancreatic cancer to the abdomen. He initially received a 6 field 3-D conformal technique to a dose of 45 gray at 1.8 gray per fraction. Due to a change in the patient's anatomy, he underwent re-simulation on 12/24/2015. A second 6 field technique treatment plan was developed to continue the patient's treatment to 45 gray. The patient then received a cone down boost treatment for an additional 5.4 gray using a 5 field technique. The patient's final dose was 50.4 gray.  Narrative: The patient tolerated radiation treatment relatively well.   The patient experienced some anorexia/weight loss ultimately by the end of treatment.  Plan: The patient has completed radiation treatment. The patient will return to radiation oncology clinic for routine followup in one month. I advised the patient to call or return sooner if they have any questions or concerns related to their recovery or treatment. ________________________________  Jodelle Gross, M.D., Ph.D.

## 2016-02-13 ENCOUNTER — Other Ambulatory Visit: Payer: Self-pay | Admitting: *Deleted

## 2016-02-13 NOTE — Progress Notes (Signed)
  Oncology Nurse Navigator Documentation  Navigator Location: [facility] (02/13/16 1651)                 Barriers/Navigation Needs: Coordination of Care (02/13/16 1651)   Interventions: Coordination of Care (02/13/16 1651)--No show for his 3/1 visit w/Dr. Alen Blew   Coordination of Care: Appts (02/13/16 1651)--POF to scheduler for MD follow up.                  Time Spent with Patient: 15 (02/13/16 1651)

## 2016-02-16 ENCOUNTER — Telehealth: Payer: Self-pay | Admitting: Oncology

## 2016-02-16 NOTE — Telephone Encounter (Signed)
s.w. pt wife and she did not want to r/s appt...Marland Kitchenhe would have to come by ambulance

## 2016-02-17 ENCOUNTER — Telehealth: Payer: Self-pay | Admitting: *Deleted

## 2016-02-17 ENCOUNTER — Telehealth: Payer: Self-pay | Admitting: Internal Medicine

## 2016-02-17 ENCOUNTER — Other Ambulatory Visit: Payer: Self-pay | Admitting: Radiation Oncology

## 2016-02-17 DIAGNOSIS — C250 Malignant neoplasm of head of pancreas: Secondary | ICD-10-CM

## 2016-02-17 MED ORDER — ONDANSETRON HCL 8 MG PO TABS: 8.0000 mg | ORAL_TABLET | Freq: Three times a day (TID) | ORAL | Status: AC | PRN

## 2016-02-17 NOTE — Telephone Encounter (Signed)
"  This is Mendel Ryder calling about a refill needed for a patient.  Zofran is needed sent to [facility] in [place], [place]."

## 2016-02-17 NOTE — Telephone Encounter (Signed)
Per dr Alen Blew, okay to d/c eloquis. Hospice nurse lindsey notified.

## 2016-02-17 NOTE — Telephone Encounter (Signed)
TC from patient's Hospice RN with medication question. Pt is declining , with decreasing po intake. Hospice MD (Dr. Lyman Speller) asking if it is ok to stop pt's Eliquis.    RN also had question regarding antiemetics as pt continues to feel nauseated much of the time. Reviewed meds with RN. Pt only using Zofran 1 x a day and phenergan 1 x a day. Advised her to try to alternate meds and take more frequently as ordered (every 6 hours prn). RN states that pt is 'funny' about his meds and doesn't like to take very often. Advised her that current meds are not being used optimally at this point, and before adding another medication to use what he has as ordered. If, at that point the medications are not effective, we could suggest something else. RN voiced understanding and will advise pt/pt's wife.

## 2016-02-17 NOTE — Telephone Encounter (Signed)
New message    Hospice nurse calling     Pt C/O medication issue:  1. Name of Medication: eliquis  5 mg   2. How are you currently taking this medication (dosage and times per day)? Twice a day   3. Are you having a reaction (difficulty breathing--STAT)? No   4. What is your medication issue? Oncology MD is requesting pt to stop medication - risk out ways the benefit of bleeding out.

## 2016-02-17 NOTE — Telephone Encounter (Signed)
I discussed this pt with Dr Caryl Comes and order given to discontinue Eliquis.  I spoke with Ria Comment and made her aware of this information.

## 2016-03-02 ENCOUNTER — Encounter: Payer: Self-pay | Admitting: Radiation Oncology

## 2016-03-03 ENCOUNTER — Telehealth: Payer: Self-pay | Admitting: *Deleted

## 2016-03-03 NOTE — Telephone Encounter (Signed)
Called patient home, spoke with the wife, called asking how Mr. Antonio Hancock was doing,"Only a matter of time now, he's not doing well at all"  yes she  appreciates the call and that we cancelled his follow up with Dr. Lisbeth Renshaw,  Hospice is there, will let [name] know I called   And sending our prayers  9:18 AM

## 2016-03-04 ENCOUNTER — Telehealth: Payer: Self-pay | Admitting: Cardiology

## 2016-03-04 ENCOUNTER — Ambulatory Visit: Admit: 2016-03-04 | Payer: Medicare Other | Admitting: Radiation Oncology

## 2016-03-04 ENCOUNTER — Ambulatory Visit: Payer: Medicare Other | Admitting: Nurse Practitioner

## 2016-03-04 ENCOUNTER — Encounter: Payer: BLUE CROSS/BLUE SHIELD | Admitting: *Deleted

## 2016-03-04 HISTORY — DX: Personal history of irradiation: Z92.3

## 2016-03-04 NOTE — Telephone Encounter (Signed)
Confirmed remote transmission w/ pt son and he informed that pt is not doing very well and that he may not make it through the day. Informed pt son not to worry about the remote transmission. Pt son verbalized understanding.

## 2016-03-05 ENCOUNTER — Encounter: Payer: Self-pay | Admitting: Cardiology

## 2016-03-06 DEATH — deceased

## 2016-03-09 ENCOUNTER — Ambulatory Visit: Payer: Medicare Other | Admitting: Oncology

## 2016-03-16 ENCOUNTER — Telehealth: Payer: Self-pay | Admitting: Oncology

## 2016-03-16 NOTE — Telephone Encounter (Signed)
Certificate of death received in HIM. Patient status changed to deceased.

## 2016-03-16 NOTE — Telephone Encounter (Signed)
[place] that we mailed the death certificate

## 2016-04-01 IMAGING — US US ABDOMEN LIMITED
1 series · 5 of 5 positions shown · non-contrast
Comparison: CT 01/13/2016

CLINICAL DATA: Ascites.

EXAM:
LIMITED ABDOMEN ULTRASOUND FOR ASCITES
TECHNIQUE: Limited ultrasound survey for ascites was performed in all four
abdominal quadrants.

[Series 1: us abdomen limited · 0.26mm/px · 5 of 5 slices shown]
[im 1/5]
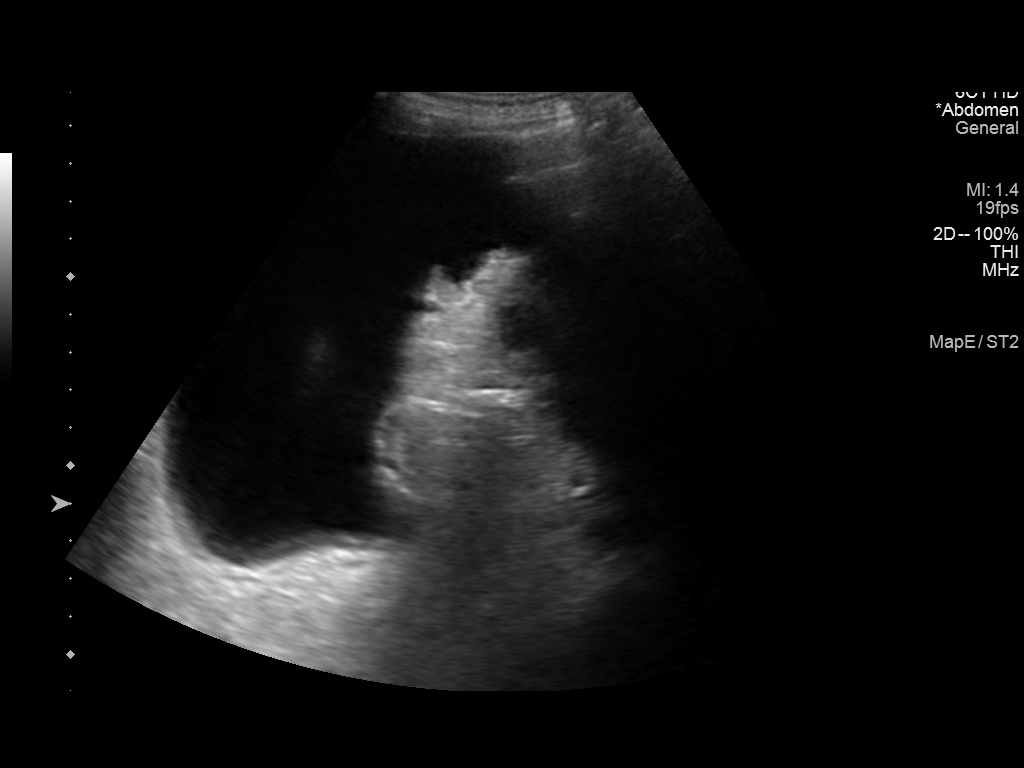
[im 2/5]
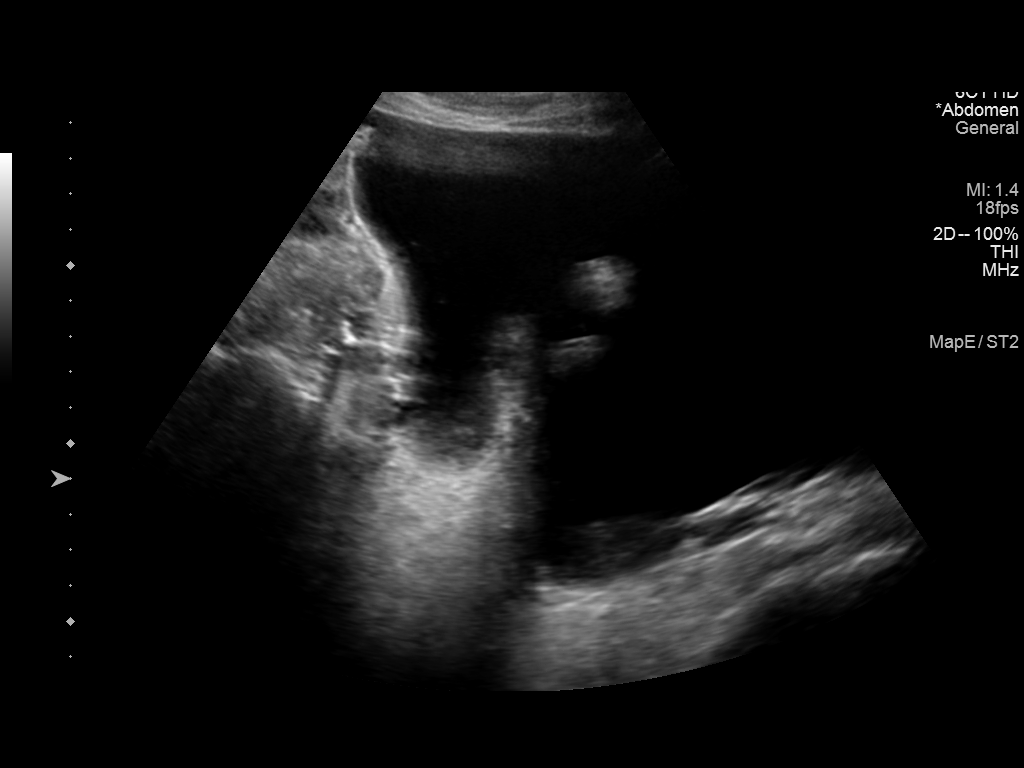
[im 3/5]
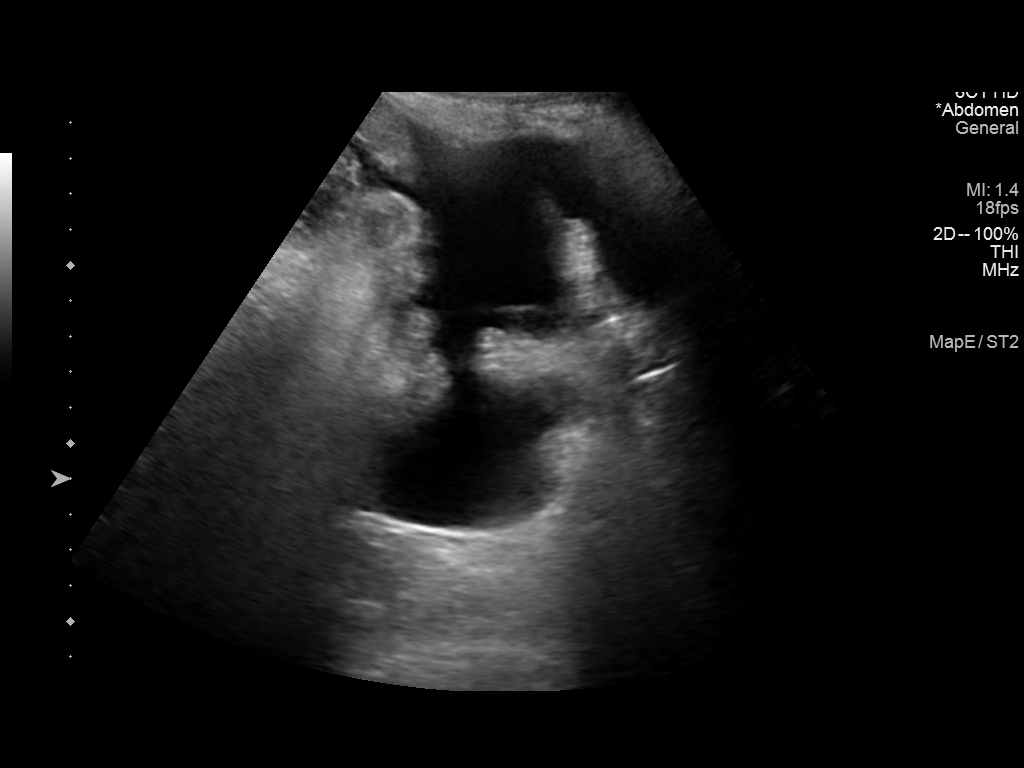
[im 4/5]
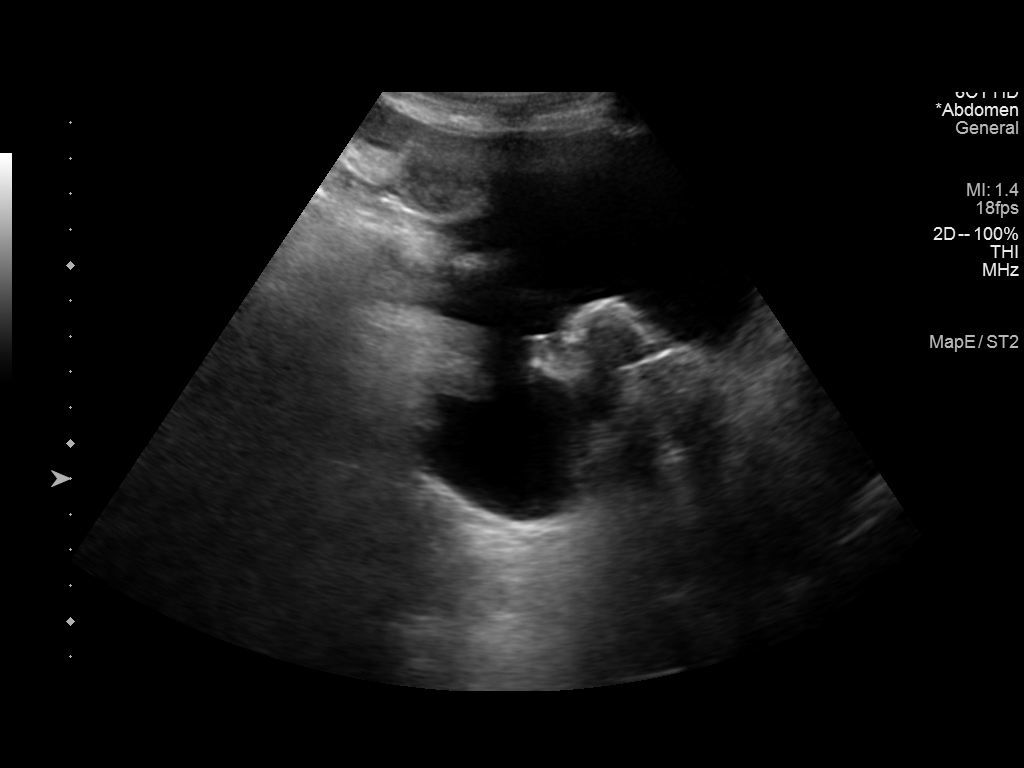
[im 5/5]
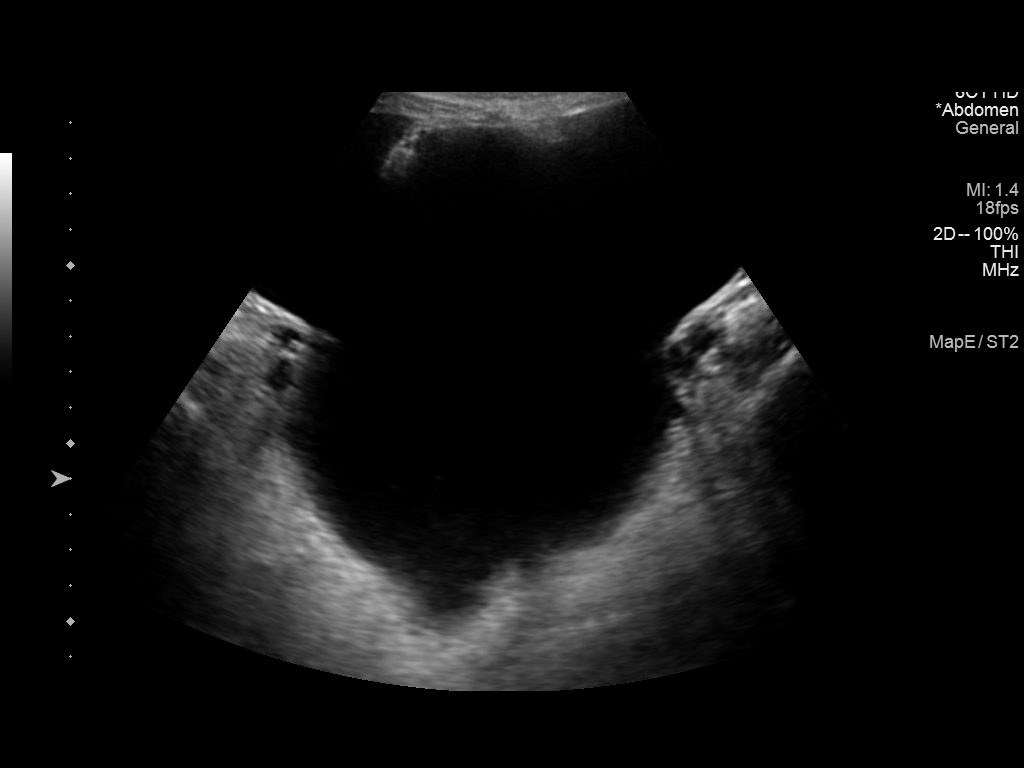

[5 of 5 positions shown; findings below may reference images not displayed]

FINDINGS: Exam demonstrates moderate simple appearing ascites in all 4
quadrants.
IMPRESSION: Moderate ascites.

## 2016-04-07 IMAGING — US US PARACENTESIS
1 series · 5 of 5 positions shown · non-contrast
Comparison: none

INDICATION: Pancreatic cancer, recurrent ascites. Request is made for diagnostic
and therapeutic paracentesis.

[Series 1: us paracentesis · 0.26mm/px · 5 of 5 slices shown]
[im 1/5]
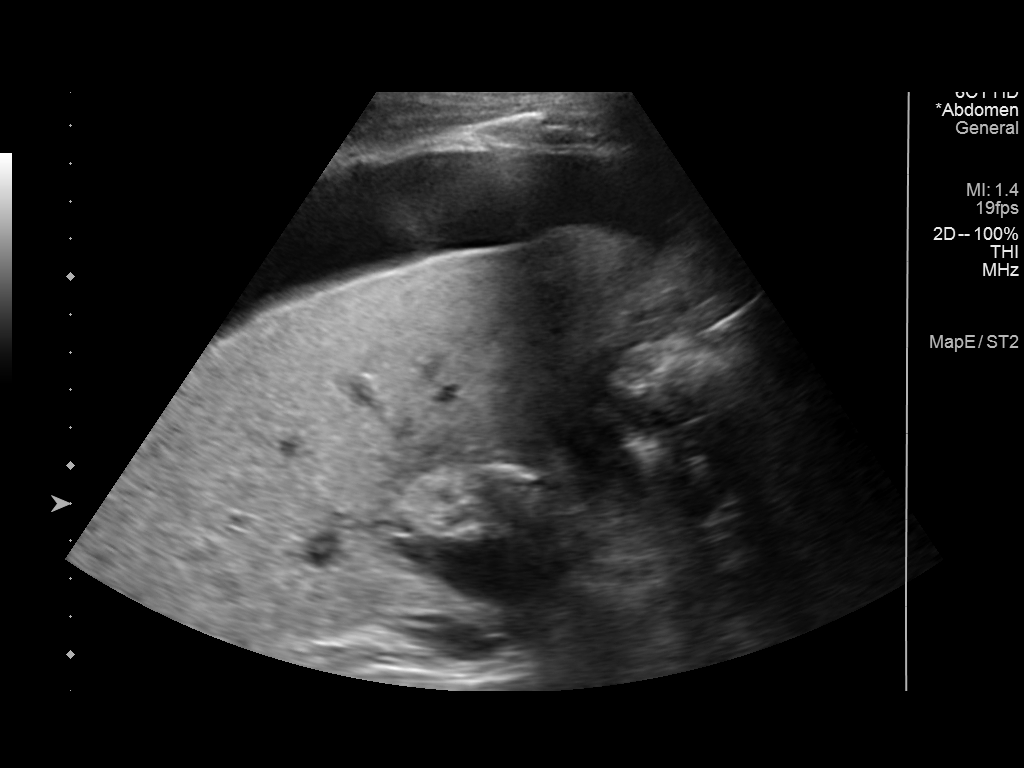
[im 2/5]
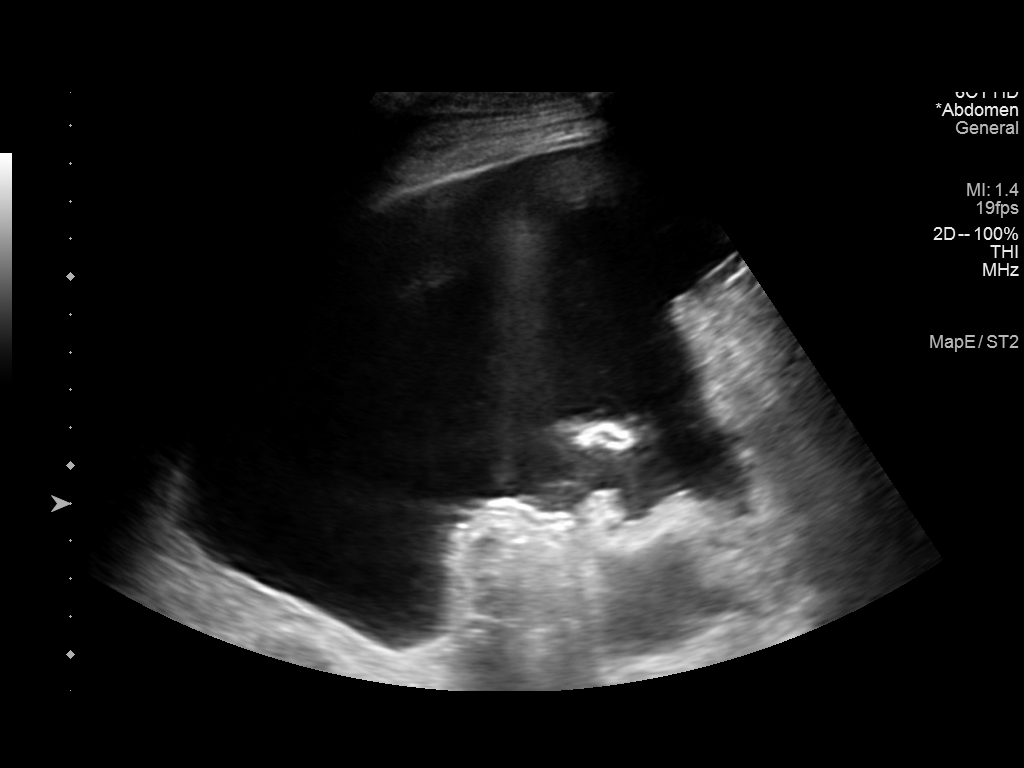
[im 3/5]
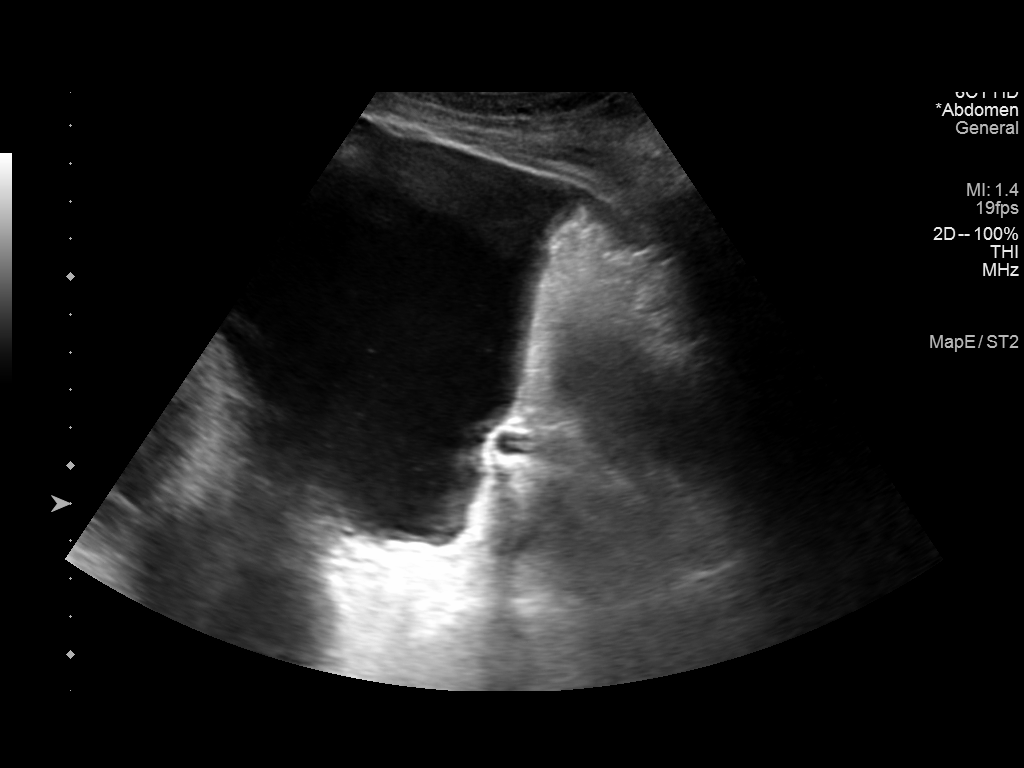
[im 4/5]
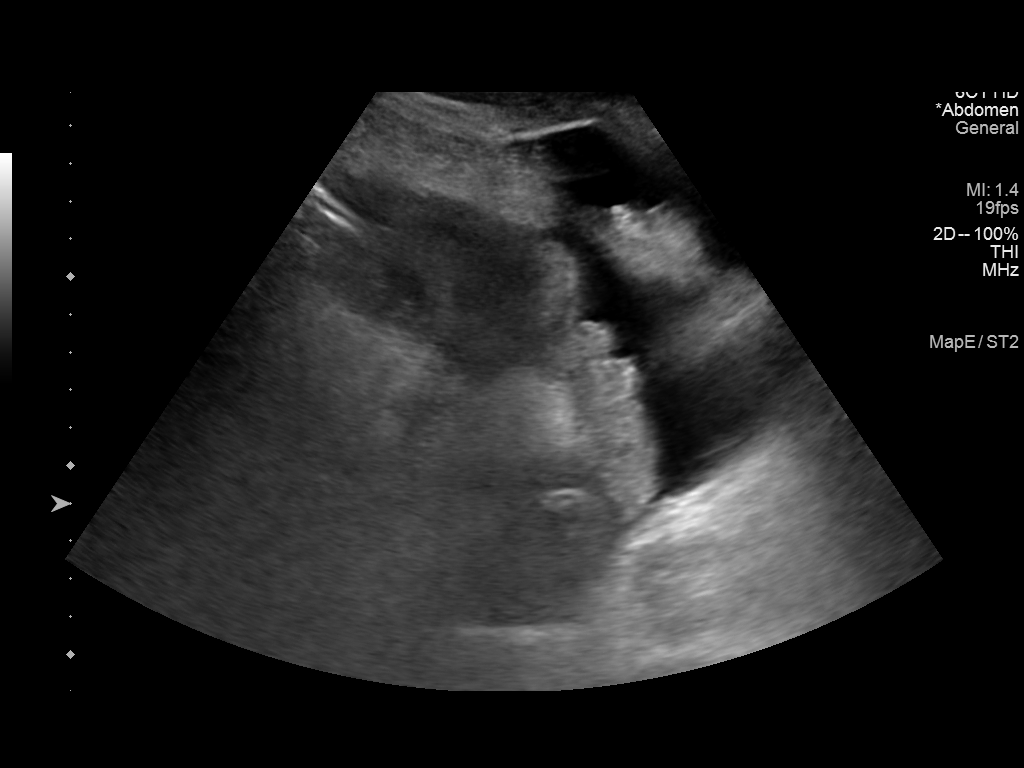
[im 5/5]
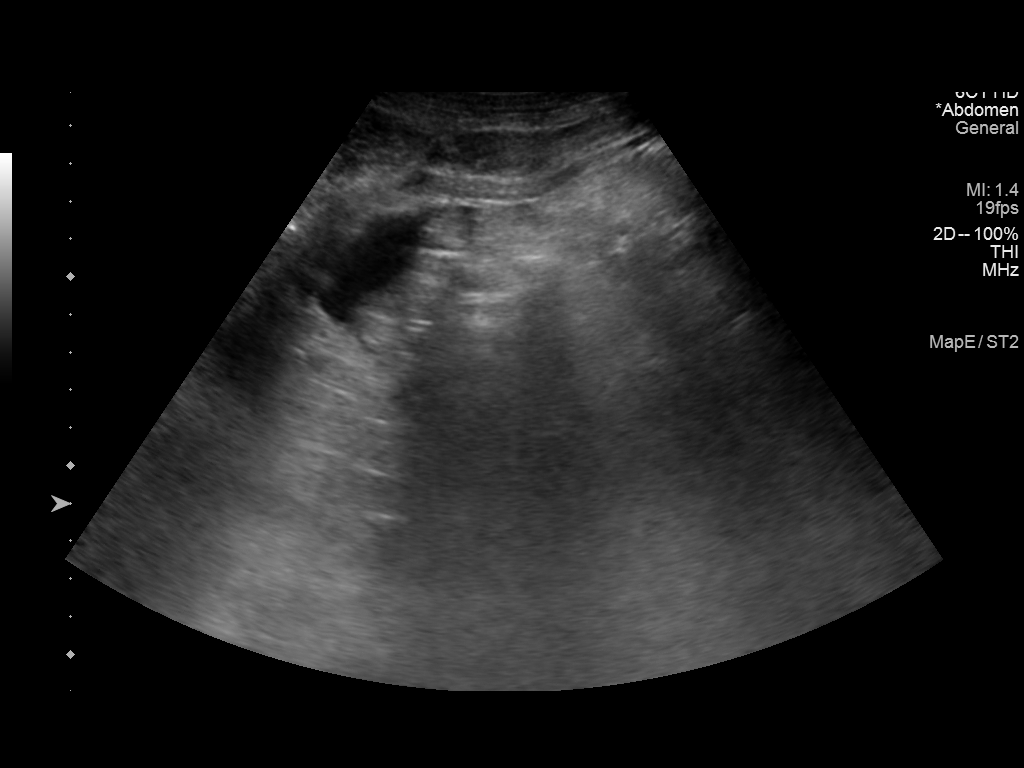

[5 of 5 positions shown; findings below may reference images not displayed]

EXAM:
ULTRASOUND GUIDED DIAGNOSTIC AND THERAPEUTIC PARACENTESIS

MEDICATIONS:
None.

COMPLICATIONS:
None immediate.

PROCEDURE:
Informed written consent was obtained from the patient after a
discussion of the risks, benefits and alternatives to treatment. A
timeout was performed prior to the initiation of the procedure.

Initial ultrasound scanning demonstrates a moderate amount of
ascites within the right upper to mid abdominal quadrant. The right
upper to mid abdomen was prepped and draped in the usual sterile
fashion. 1% lidocaine was used for local anesthesia.

Following this, a Yueh catheter was introduced. An ultrasound image
was saved for documentation purposes. The paracentesis was
performed. The catheter was removed and a dressing was applied. The
patient tolerated the procedure well without immediate post
procedural complication.
FINDINGS: A total of approximately 4.1 of slightly hazy, light yellow fluid
was removed. Samples were sent to the laboratory as requested by the
clinical team.
IMPRESSION: Successful ultrasound-guided diagnostic and therapeutic paracentesis
yielding 4.1 liters of peritoneal fluid.
